# Patient Record
Sex: Male | Born: 1965 | Hispanic: Yes | Marital: Single | Smoking: Former smoker
Health system: Southern US, Community
[De-identification: ages and names within clinical notes are randomized; demographics above are authoritative.]

## PROBLEM LIST (undated history)

## (undated) DIAGNOSIS — I10 Essential (primary) hypertension: Secondary | ICD-10-CM

## (undated) DIAGNOSIS — E119 Type 2 diabetes mellitus without complications: Secondary | ICD-10-CM

## (undated) HISTORY — DX: Essential (primary) hypertension: I10

## (undated) HISTORY — DX: Type 2 diabetes mellitus without complications: E11.9

## (undated) HISTORY — PX: OTHER SURGICAL HISTORY: SHX169

---

## 2006-07-30 ENCOUNTER — Emergency Department (HOSPITAL_COMMUNITY): Admission: EM | Admit: 2006-07-30 | Discharge: 2006-07-30 | Payer: Self-pay | Admitting: Emergency Medicine

## 2016-01-20 ENCOUNTER — Emergency Department (HOSPITAL_COMMUNITY)
Admission: EM | Admit: 2016-01-20 | Discharge: 2016-01-20 | Disposition: A | Discharge status: Home / Self Care | Payer: Self-pay | Attending: Emergency Medicine | Admitting: Emergency Medicine

## 2016-01-20 ENCOUNTER — Encounter (HOSPITAL_COMMUNITY): Payer: Self-pay | Admitting: Emergency Medicine

## 2016-01-20 DIAGNOSIS — M549 Dorsalgia, unspecified: Secondary | ICD-10-CM

## 2016-01-20 DIAGNOSIS — M545 Low back pain: Secondary | ICD-10-CM | POA: Insufficient documentation

## 2016-01-20 MED ORDER — NAPROXEN 500 MG PO TABS: 500.0000 mg | ORAL_TABLET | Freq: Two times a day (BID) | ORAL | Status: DC

## 2016-01-20 MED ORDER — METHOCARBAMOL 500 MG PO TABS: 500.0000 mg | ORAL_TABLET | Freq: Two times a day (BID) | ORAL | Status: DC

## 2016-01-20 NOTE — ED Provider Notes (Signed)
CSN: [id number]     Arrival date & time 01/20/16  [id number] History  By signing my name below, I, Emmanuella Pham, attest that this documentation has been prepared under the direction and in the presence of Ian SitesLisa Betsie Peckman, PA-C. Electronically Signed: Angelene GiovanniEmmanuella Pham, ED Scribe. 01/20/2016. 10:29 AM.    Chief Complaint  Patient presents with  . Back Pain   The history is provided by the patient. No language interpreter was used.   HPI Comments: Ian Pham is a 50 y.o. male who presents to the Emergency Department complaining of gradually worsening moderate tight lower back pain onset one week ago after heavy lifting.  He adds that he constantly lifts heavy materials at work. No alleviating factors noted. Pt has tried OTC medication and icy hot with no relief. He reports NKDA. No fever, numbness/weakness, bowel/bladder incontinence, rash, or any open wounds.    History reviewed. No pertinent past medical history. History reviewed. No pertinent past surgical history. No family history on file. Social History  Substance Use Topics  . Smoking status: Never Smoker   . Smokeless tobacco: None  . Alcohol Use: None    Review of Systems  Constitutional: Negative for fever.  Musculoskeletal: Positive for back pain.  Skin: Negative for rash.  All other systems reviewed and are negative.     Allergies  Review of patient's allergies indicates not on file.  Home Medications   Prior to Admission medications   Not on File   BP 123/69 mmHg  Pulse 77  Temp(Src) 98.3 F (36.8 C) (Oral)  Resp 20  SpO2 96%   Physical Exam  Constitutional: He is oriented to person, place, and time. He appears well-developed and well-nourished.  HENT:  Head: Normocephalic and atraumatic.  Mouth/Throat: Oropharynx is clear and moist.  Eyes: Conjunctivae and EOM are normal. Pupils are equal, round, and reactive to light.  Neck: Normal range of motion.  Cardiovascular: Normal rate, regular rhythm and  normal heart sounds.   Pulmonary/Chest: Effort normal and breath sounds normal. No respiratory distress. He has no wheezes.  Abdominal: Soft. Bowel sounds are normal.  Musculoskeletal: Normal range of motion.  LS without midline deformities or step-off; muscular tenderness of lumbar paraspinal muscles bilaterally with spasm noted, full range of motion maintained, normal strength interpretation of bilateral lower extremities, normal gait  Neurological: He is alert and oriented to person, place, and time.  Skin: Skin is warm and dry.  Psychiatric: He has a normal mood and affect.  Nursing note and vitals reviewed.   ED Course  Procedures (including critical care time) DIAGNOSTIC STUDIES: Oxygen Saturation is 96% on RA, normal by my interpretation.    COORDINATION OF CARE: 10:21 AM- Pt advised of plan for treatment and pt agrees. Pt will receive Naproxen and Robaxin. [facility].   MDM   Final diagnoses:  Back pain, unspecified location   50 year old male here with low back pain after heavy lifting at work. No injury, trauma, or falls. Patient is afebrile, nontoxic.   No midline tenderness or deformities about the lumbar spine on exam. He does have some muscular tenderness bilateraly.  Neurologically intact without deficits to suggest cauda equina. No red flag symptoms. I suspect lumbar strain. Will start on anti-inflammatories and muscle relaxer. Recommended to follow-up with the [place] and wellness clinic.  Discussed plan with patient, he/she acknowledged understanding and agreed with plan of care.  Return precautions given for new or worsening symptoms.  I personally performed the services described in this documentation, which was scribed in my presence. The recorded information has been reviewed and is accurate.  Ian HatchetLisa M Orton Capell, PA-C 01/20/16 1036  Ian OharaJoshua Zavitz, MD 01/20/16 [phone]

## 2016-01-20 NOTE — Discharge Instructions (Signed)
Take the prescribed medication as directed.  May wish to use heating pad, warm shower, etc to help soothe muscles as well. Follow-up with [place] and wellness clinic. Return to the ED for new or worsening symptoms.

## 2016-01-20 NOTE — ED Notes (Signed)
Sharp pain in back while at work x 1 week ago has not gotten better

## 2016-01-20 NOTE — ED Notes (Signed)
Declined W/C at D/C and was escorted to lobby by RN. 

## 2017-07-07 ENCOUNTER — Other Ambulatory Visit: Payer: Self-pay

## 2017-07-07 ENCOUNTER — Emergency Department (HOSPITAL_COMMUNITY)
Admission: EM | Admit: 2017-07-07 | Discharge: 2017-07-07 | Disposition: A | Discharge status: Home / Self Care | Payer: Self-pay | Attending: Emergency Medicine | Admitting: Emergency Medicine

## 2017-07-07 DIAGNOSIS — R52 Pain, unspecified: Secondary | ICD-10-CM | POA: Insufficient documentation

## 2017-07-07 DIAGNOSIS — M791 Myalgia, unspecified site: Secondary | ICD-10-CM | POA: Insufficient documentation

## 2017-07-07 LAB — BASIC METABOLIC PANEL
Anion gap: 6 (ref 5–15)
BUN: 8 mg/dL (ref 6–20)
CO2: 23 mmol/L (ref 22–32)
Calcium: 9.4 mg/dL (ref 8.9–10.3)
Chloride: 104 mmol/L (ref 101–111)
Creatinine, Ser: 0.67 mg/dL (ref 0.61–1.24)
GFR calc Af Amer: >60 mL/min (ref >60)
GFR calc non Af Amer: >60 mL/min (ref >60)
Glucose, Bld: 142 mg/dL — ABNORMAL HIGH (ref 65–99)
Potassium: 3.8 mmol/L (ref 3.5–5.1)
Sodium: 133 mmol/L — ABNORMAL LOW (ref 135–145)

## 2017-07-07 LAB — CBC WITH DIFFERENTIAL/PLATELET
Basophils Absolute: 0 10*3/uL (ref 0.0–0.1)
Basophils Relative: 1 %
Eosinophils Absolute: 0.3 10*3/uL (ref 0.0–0.7)
Eosinophils Relative: 4 %
HCT: 44.6 % (ref 39.0–52.0)
Hemoglobin: 15.6 g/dL (ref 13.0–17.0)
Lymphocytes Relative: 37 %
Lymphs Abs: 3 10*3/uL (ref 0.7–4.0)
MCH: 32.2 pg (ref 26.0–34.0)
MCHC: 35 g/dL (ref 30.0–36.0)
MCV: 92.1 fL (ref 78.0–100.0)
Monocytes Absolute: 0.8 10*3/uL (ref 0.1–1.0)
Monocytes Relative: 10 %
Neutro Abs: 3.8 10*3/uL (ref 1.7–7.7)
Neutrophils Relative %: 48 %
Platelets: 211 10*3/uL (ref 150–400)
RBC: 4.84 MIL/uL (ref 4.22–5.81)
RDW: 12.8 % (ref 11.5–15.5)
WBC: 8 10*3/uL (ref 4.0–10.5)

## 2017-07-07 NOTE — Discharge Instructions (Signed)
Please read attached information. If you experience any new or worsening signs or symptoms please return to the emergency room for evaluation. Please follow-up with your primary care provider or specialist as discussed.  °

## 2017-07-07 NOTE — ED Triage Notes (Signed)
Pt c/o generalized body aches for two weeks, denies all other symptoms, pt denies SOB, n/v/d, A&o x4

## 2017-07-07 NOTE — ED Provider Notes (Signed)
[facility] EMERGENCY DEPARTMENT Provider Note   CSN: [id number] Arrival date & time: 07/07/17  1032     History   Chief Complaint Chief Complaint  Patient presents with  . Generalized Body Aches    HPI Ian Pham is a 51 y.o. male.  HPI translator used  51 year old male presents today with generalized body aches.  Patient notes 2-3 week history of pain in his joints.  He also notes pain in the muscles as well.  Patient reports symptoms are worse in the afternoon and evening.  He reports no focal weakness or trauma, normal urine output, denies fevers or recent infection.  Patient reports movement makes symptoms somewhat better.  He notes an episode several years ago of the same that resolved.  Patient reports he works in [employer].  Patient reports using Advil without some dramatic improvement.      No past medical history on file.  There are no active problems to display for this patient.   No past surgical history on file.     Home Medications    Prior to Admission medications   Medication Sig Start Date End Date Taking? Authorizing Provider  ibuprofen (ADVIL,MOTRIN) 200 MG tablet Take 200 mg by mouth every 6 (six) hours as needed for moderate pain.   Yes [provider]  methocarbamol (ROBAXIN) 500 MG tablet Take 1 tablet (500 mg total) by mouth 2 (two) times daily. Patient not taking: Reported on 07/07/2017 01/20/16   Garlon HatchetSanders, Lisa M, PA-C  naproxen (NAPROSYN) 500 MG tablet Take 1 tablet (500 mg total) by mouth 2 (two) times daily with a meal. Patient not taking: Reported on 07/07/2017 01/20/16   Garlon HatchetSanders, Lisa M, PA-C    Family History No family history on file.  Social History Social History   Tobacco Use  . Smoking status: Never Smoker  Substance Use Topics  . Alcohol use: Not on file  . Drug use: Not on file     Allergies   Patient has no allergy information on record.   Review of Systems Review of Systems    All other systems reviewed and are negative.    Physical Exam Updated Vital Signs BP 136/78 (BP Location: Right Arm)   Pulse 72   Temp 97.6 F (36.4 C) (Oral)   Resp 16   SpO2 98%   Physical Exam  Constitutional: He is oriented to person, place, and time. He appears well-developed and well-nourished.  HENT:  Head: Normocephalic and atraumatic.  Eyes: Conjunctivae are normal. Pupils are equal, round, and reactive to light. Right eye exhibits no discharge. Left eye exhibits no discharge. No scleral icterus.  Neck: Normal range of motion. No JVD present. No tracheal deviation present.  Pulmonary/Chest: Effort normal. No stridor.  Musculoskeletal:  TTP of the back diffusely - non focal  Joints supple full active range of motion-no swelling or edema, joint compartments soft  Neurological: He is alert and oriented to person, place, and time. Coordination normal.  Psychiatric: He has a normal mood and affect. His behavior is normal. Judgment and thought content normal.  Nursing note and vitals reviewed.    ED Treatments / Results  Labs (all labs ordered are listed, but only abnormal results are displayed) Labs Reviewed  BASIC METABOLIC PANEL - Abnormal; Notable for the following components:      Result Value   Sodium 133 (*)    Glucose, Bld 142 (*)    All other components within normal limits  CBC WITH  DIFFERENTIAL/PLATELET    EKG  EKG Interpretation None       Radiology No results found.  Procedures Procedures (including critical care time)  Medications Ordered in ED Medications - No data to display   Initial Impression / Assessment and Plan / ED Course  I have reviewed the triage vital signs and the nursing notes.  Pertinent labs & imaging results that were available during my care of the patient were reviewed by me and considered in my medical decision making (see chart for details).      Final Clinical Impressions(s) / ED Diagnoses   Final  diagnoses:  Body aches    Labs: BC, BMP  Imaging:  Consults:  Therapeutics:  Discharge Meds:   Assessment/Plan: 51 year old male presents today with vague complaints.  Signs of infectious etiology.  I question arthritis in this patient.  Patient instructed to rest, ibuprofen or Tylenol, return immediately with any new or worsening signs or symptoms.  Patient verbalized understanding and agreement to today's plan had no further questions or concerns.      ED Discharge Orders    None       Rosalio LoudHedges, Garmon Dehn, PA-C 07/07/17 1620    Bethann BerkshireZammit, Joseph, MD 07/08/17 [phone]

## 2017-08-26 ENCOUNTER — Other Ambulatory Visit: Payer: Self-pay

## 2017-08-26 ENCOUNTER — Encounter (HOSPITAL_COMMUNITY): Payer: Self-pay | Admitting: Neurology

## 2017-08-26 ENCOUNTER — Emergency Department (HOSPITAL_COMMUNITY)
Admission: EM | Admit: 2017-08-26 | Discharge: 2017-08-26 | Disposition: A | Discharge status: Home / Self Care | Payer: Self-pay | Attending: Emergency Medicine | Admitting: Emergency Medicine

## 2017-08-26 DIAGNOSIS — N342 Other urethritis: Secondary | ICD-10-CM | POA: Insufficient documentation

## 2017-08-26 LAB — URINALYSIS, ROUTINE W REFLEX MICROSCOPIC
Bilirubin Urine: NEGATIVE
GLUCOSE, UA: NEGATIVE mg/dL
Hgb urine dipstick: NEGATIVE
Ketones, ur: NEGATIVE mg/dL
Leukocytes, UA: NEGATIVE
Nitrite: NEGATIVE
PH: 8 (ref 5.0–8.0)
Protein, ur: NEGATIVE mg/dL
Specific Gravity, Urine: 1.016 (ref 1.005–1.030)

## 2017-08-26 MED ORDER — CEFTRIAXONE SODIUM 250 MG IJ SOLR
250.0000 mg | Freq: Once | INTRAMUSCULAR | Status: AC
  Administered 2017-08-26: 250 mg via INTRAMUSCULAR
  Filled 2017-08-26: qty 250

## 2017-08-26 MED ORDER — LIDOCAINE HCL (PF) 1 % IJ SOLN
INTRAMUSCULAR | Status: AC
  Filled 2017-08-26: qty 5

## 2017-08-26 MED ORDER — AZITHROMYCIN 250 MG PO TABS
1000.0000 mg | ORAL_TABLET | Freq: Once | ORAL | Status: AC
  Administered 2017-08-26: 1000 mg via ORAL
  Filled 2017-08-26: qty 4

## 2017-08-26 MED ORDER — LIDOCAINE HCL (PF) 1 % IJ SOLN
2.0000 mL | Freq: Once | INTRAMUSCULAR | Status: AC
  Administered 2017-08-26: 2 mL

## 2017-08-26 NOTE — ED Notes (Signed)
Translator used to go over discharge papers Onalee Hua- David [phone]

## 2017-08-26 NOTE — ED Triage Notes (Signed)
Is spanish speaking-using translator- a few days ago he had intercourse, has been having pelvic pain, with burning to his penis, urinating more frequently. Is concerned he may have contracted an STD. Denies penile discharge or hematuria. Main complaint is pain in his penis to his belly button.

## 2017-08-26 NOTE — ED Provider Notes (Signed)
[facility] Provider Note   CSN: [id number] Arrival date & time: 08/26/17  1301     History   Chief Complaint Chief Complaint  Patient presents with  . Urinary Frequency    HPI Ian Pham is a 52 y.o. male.  HPI translator was used for additional help  52 year old male presents today with dysuria.  Patient notes 3-4-week history of dysuria and pain up into his abdomen.  He notes the pain comes after urinating rating up to the umbilicus.  He denies any penile discharge, denies any rash.  He reports that he is sexually active and had sex with a new partner around the time symptoms started, he did not wear a condom.  Patient denies any fever, nausea vomiting, denies any perineal pain, reports he is able to fully empty his bladder.  Patient notes he has had STDs in the past and reports this feels similar.   History reviewed. No pertinent past medical history.  There are no active problems to display for this patient.   History reviewed. No pertinent surgical history.     Home Medications    Prior to Admission medications   Medication Sig Start Date End Date Taking? Authorizing Provider  ibuprofen (ADVIL,MOTRIN) 200 MG tablet Take 200 mg by mouth every 6 (six) hours as needed for moderate pain.    [provider]  methocarbamol (ROBAXIN) 500 MG tablet Take 1 tablet (500 mg total) by mouth 2 (two) times daily. Patient not taking: Reported on 07/07/2017 01/20/16   Garlon HatchetSanders, Lisa M, PA-C  naproxen (NAPROSYN) 500 MG tablet Take 1 tablet (500 mg total) by mouth 2 (two) times daily with a meal. Patient not taking: Reported on 07/07/2017 01/20/16   Garlon HatchetSanders, Lisa M, PA-C    Family History No family history on file.  Social History Social History   Tobacco Use  . Smoking status: Never Smoker  Substance Use Topics  . Alcohol use: Yes  . Drug use: Not on file     Allergies   Patient has no known allergies.   Review of  Systems Review of Systems  All other systems reviewed and are negative.    Physical Exam Updated Vital Signs BP 139/82 (BP Location: Right Arm)   Pulse 90   Temp 98 F (36.7 C) (Oral)   Resp 16   SpO2 96%   Physical Exam  Constitutional: He is oriented to person, place, and time. He appears well-developed and well-nourished.  HENT:  Head: Normocephalic and atraumatic.  Eyes: Conjunctivae are normal. Pupils are equal, round, and reactive to light. Right eye exhibits no discharge. Left eye exhibits no discharge. No scleral icterus.  Neck: Normal range of motion. No JVD present. No tracheal deviation present.  Pulmonary/Chest: Effort normal. No stridor.  Neurological: He is alert and oriented to person, place, and time. Coordination normal.  Psychiatric: He has a normal mood and affect. His behavior is normal. Judgment and thought content normal.  Nursing note and vitals reviewed.    ED Treatments / Results  Labs (all labs ordered are listed, but only abnormal results are displayed) Labs Reviewed  URINALYSIS, ROUTINE W REFLEX MICROSCOPIC  GC/CHLAMYDIA PROBE AMP ([place]) NOT AT [facility]    EKG  EKG Interpretation None       Radiology No results found.  Procedures Procedures (including critical care time)  Medications Ordered in ED Medications  lidocaine (PF) (XYLOCAINE) 1 % injection (not administered)  cefTRIAXone (ROCEPHIN) injection 250 mg (250  mg Intramuscular Given 08/26/17 1528)  azithromycin (ZITHROMAX) tablet 1,000 mg (1,000 mg Oral Given 08/26/17 1529)  lidocaine (PF) (XYLOCAINE) 1 % injection 2 mL (2 mLs Other Given 08/26/17 1529)     Initial Impression / Assessment and Plan / ED Course  I have reviewed the triage vital signs and the nursing notes.  Pertinent labs & imaging results that were available during my care of the patient were reviewed by me and considered in my medical decision making (see chart for details).      Final Clinical  Impressions(s) / ED Diagnoses   Final diagnoses:  Urethritis    Labs: Urinalysis, GC  Imaging:  Consults:  Therapeutics:  Discharge Meds:   Assessment/Plan: 52 year old male presents today with suspected STD.  Patient's urinalysis completely normal, he reports this feels similar to previous STD and would like testing and treatment.  Patient is not having any perineal pain he is afebrile otherwise well-appearing he is a soft benign nontender abdomen.  I have low suspicion for intra-abdominal pathology, prostatitis or any other significant infection.  Patient is encouraged to follow-up as an outpatient, return immediately with any new or worsening signs or symptoms.  He verbalized understanding and agreement to today's plan had no further questions or concerns at the time discharge.      ED Discharge Orders    None       Rosalio Loud 08/26/17 1551    Nira Conn, MD 08/26/17 2043

## 2017-08-26 NOTE — Discharge Instructions (Signed)
Please read attached information. If you experience any new or worsening signs or symptoms please return to the emergency room for evaluation. Please follow-up with your primary care provider or specialist as discussed. Please use medication prescribed only as directed and discontinue taking if you have any concerning signs or symptoms.   °

## 2017-08-27 LAB — GC/CHLAMYDIA PROBE AMP (~~LOC~~) NOT AT ARMC
Chlamydia: NEGATIVE
Neisseria Gonorrhea: NEGATIVE

## 2017-09-24 ENCOUNTER — Encounter (HOSPITAL_COMMUNITY): Payer: Self-pay

## 2017-09-24 ENCOUNTER — Emergency Department (HOSPITAL_COMMUNITY)
Admission: EM | Admit: 2017-09-24 | Discharge: 2017-09-24 | Disposition: A | Discharge status: Home / Self Care | Payer: Self-pay | Attending: Emergency Medicine | Admitting: Emergency Medicine

## 2017-09-24 ENCOUNTER — Emergency Department (HOSPITAL_COMMUNITY): Payer: Self-pay

## 2017-09-24 ENCOUNTER — Other Ambulatory Visit: Payer: Self-pay

## 2017-09-24 DIAGNOSIS — R197 Diarrhea, unspecified: Secondary | ICD-10-CM | POA: Insufficient documentation

## 2017-09-24 DIAGNOSIS — R112 Nausea with vomiting, unspecified: Secondary | ICD-10-CM | POA: Insufficient documentation

## 2017-09-24 DIAGNOSIS — K5732 Diverticulitis of large intestine without perforation or abscess without bleeding: Secondary | ICD-10-CM | POA: Insufficient documentation

## 2017-09-24 LAB — COMPREHENSIVE METABOLIC PANEL
ALT: 79 U/L — AB (ref 17–63)
AST: 81 U/L — ABNORMAL HIGH (ref 15–41)
Albumin: 3.4 g/dL — ABNORMAL LOW (ref 3.5–5.0)
Alkaline Phosphatase: 121 U/L (ref 38–126)
Anion gap: 10 (ref 5–15)
BUN: 9 mg/dL (ref 6–20)
CHLORIDE: 105 mmol/L (ref 101–111)
CO2: 23 mmol/L (ref 22–32)
CREATININE: 0.62 mg/dL (ref 0.61–1.24)
Calcium: 8.8 mg/dL — ABNORMAL LOW (ref 8.9–10.3)
GFR calc Af Amer: >60 mL/min (ref >60)
GFR calc non Af Amer: >60 mL/min (ref >60)
Glucose, Bld: 102 mg/dL — ABNORMAL HIGH (ref 65–99)
Potassium: 3.5 mmol/L (ref 3.5–5.1)
Sodium: 138 mmol/L (ref 135–145)
Total Bilirubin: 0.7 mg/dL (ref 0.3–1.2)
Total Protein: 7.5 g/dL (ref 6.5–8.1)

## 2017-09-24 LAB — CBC
HCT: 40.4 % (ref 39.0–52.0)
Hemoglobin: 14.3 g/dL (ref 13.0–17.0)
MCH: 32.4 pg (ref 26.0–34.0)
MCHC: 35.4 g/dL (ref 30.0–36.0)
MCV: 91.6 fL (ref 78.0–100.0)
PLATELETS: 200 10*3/uL (ref 150–400)
RBC: 4.41 MIL/uL (ref 4.22–5.81)
RDW: 13.1 % (ref 11.5–15.5)
WBC: 7.7 10*3/uL (ref 4.0–10.5)

## 2017-09-24 LAB — LIPASE, BLOOD: LIPASE: 25 U/L (ref 11–51)

## 2017-09-24 LAB — URINALYSIS, ROUTINE W REFLEX MICROSCOPIC
Bilirubin Urine: NEGATIVE
GLUCOSE, UA: NEGATIVE mg/dL
Hgb urine dipstick: NEGATIVE
Ketones, ur: NEGATIVE mg/dL
Leukocytes, UA: NEGATIVE
Nitrite: NEGATIVE
PROTEIN: NEGATIVE mg/dL
Specific Gravity, Urine: >1.046 — ABNORMAL HIGH (ref 1.005–1.030)
pH: 9 — ABNORMAL HIGH (ref 5.0–8.0)

## 2017-09-24 MED ORDER — CIPROFLOXACIN IN D5W 400 MG/200ML IV SOLN
400.0000 mg | Freq: Once | INTRAVENOUS | Status: AC
  Administered 2017-09-24: 400 mg via INTRAVENOUS
  Filled 2017-09-24: qty 200

## 2017-09-24 MED ORDER — METRONIDAZOLE 500 MG PO TABS: 500.0000 mg | ORAL_TABLET | Freq: Two times a day (BID) | ORAL | 0 refills | Status: DC

## 2017-09-24 MED ORDER — PROMETHAZINE HCL 25 MG PO TABS: 25.0000 mg | ORAL_TABLET | Freq: Four times a day (QID) | ORAL | 0 refills | Status: DC | PRN

## 2017-09-24 MED ORDER — METRONIDAZOLE IN NACL 5-0.79 MG/ML-% IV SOLN
500.0000 mg | Freq: Once | INTRAVENOUS | Status: AC
  Administered 2017-09-24: 500 mg via INTRAVENOUS
  Filled 2017-09-24: qty 100

## 2017-09-24 MED ORDER — SODIUM CHLORIDE 0.9 % IV BOLUS (SEPSIS)
1000.0000 mL | Freq: Once | INTRAVENOUS | Status: AC
  Administered 2017-09-24: 1000 mL via INTRAVENOUS

## 2017-09-24 MED ORDER — HYDROCODONE-ACETAMINOPHEN 5-325 MG PO TABS: 1.0000 | ORAL_TABLET | Freq: Four times a day (QID) | ORAL | 0 refills | Status: DC | PRN

## 2017-09-24 MED ORDER — CIPROFLOXACIN HCL 500 MG PO TABS: 500.0000 mg | ORAL_TABLET | Freq: Two times a day (BID) | ORAL | 0 refills | Status: DC

## 2017-09-24 MED ORDER — IOPAMIDOL (ISOVUE-300) INJECTION 61%
INTRAVENOUS | Status: AC
  Administered 2017-09-24: 100 mL
  Filled 2017-09-24: qty 100

## 2017-09-24 NOTE — ED Notes (Signed)
Dietary services delivered food tray to pt room despite no food tray being ordered. Pt ate all the food on the tray. EDP aware.

## 2017-09-24 NOTE — ED Notes (Addendum)
Pt reports abd pain x 4 days with N/V/D worsening upon eating. Denies CP or SOB. Denies fever, chills, or cough

## 2017-09-24 NOTE — ED Provider Notes (Signed)
[facility] EMERGENCY DEPARTMENT Provider Note   CSN: [id number] Arrival date & time: 09/24/17  1213     History   Chief Complaint Chief Complaint  Patient presents with  . Abdominal Pain    HPI Ian Pham is a 52 y.o. male.  HPI Patient presents to the emergency department with abdominal discomfort that started 3 days ago.  The patient states he has had diarrhea with some nausea and vomiting.  The patient states that he has not had any fever.  The patient states he did not take any medications prior to arrival for symptoms.  The patient denies chest pain, shortness of breath, headache,blurred vision, neck pain, fever, cough, weakness, numbness, dizziness, anorexia, edema, rash, back pain, dysuria, hematemesis, bloody stool, near syncope, or syncope.  Patient states that palpation makes the pain worse. History reviewed. No pertinent past medical history.  There are no active problems to display for this patient.   History reviewed. No pertinent surgical history.     Home Medications    Prior to Admission medications   Medication Sig Start Date End Date Taking? Authorizing Provider  ibuprofen (ADVIL,MOTRIN) 200 MG tablet Take 200 mg by mouth every 6 (six) hours as needed for moderate pain.    [provider]  methocarbamol (ROBAXIN) 500 MG tablet Take 1 tablet (500 mg total) by mouth 2 (two) times daily. Patient not taking: Reported on 07/07/2017 01/20/16   Garlon Hatchet, PA-C  naproxen (NAPROSYN) 500 MG tablet Take 1 tablet (500 mg total) by mouth 2 (two) times daily with a meal. Patient not taking: Reported on 07/07/2017 01/20/16   Garlon Hatchet, PA-C    Family History No family history on file.  Social History Social History   Tobacco Use  . Smoking status: Never Smoker  Substance Use Topics  . Alcohol use: Yes  . Drug use: Not on file     Allergies   Patient has no known allergies.   Review of Systems Review of  Systems All other systems negative except as documented in the HPI. All pertinent positives and negatives as reviewed in the HPI.  Physical Exam Updated Vital Signs BP (!) 126/91   Pulse 63   Temp 98.8 F (37.1 C) (Oral)   Resp 19   Wt 77.1 kg (170 lb)   SpO2 96%   Physical Exam  Constitutional: He is oriented to person, place, and time. He appears well-developed and well-nourished. No distress.  HENT:  Head: Normocephalic and atraumatic.  Mouth/Throat: Oropharynx is clear and moist.  Eyes: Pupils are equal, round, and reactive to light.  Neck: Normal range of motion. Neck supple.  Cardiovascular: Normal rate, regular rhythm and normal heart sounds. Exam reveals no gallop and no friction rub.  No murmur heard. Pulmonary/Chest: Effort normal and breath sounds normal. No respiratory distress. He has no wheezes.  Abdominal: Soft. Bowel sounds are normal. He exhibits no distension. There is tenderness in the left lower quadrant. There is no rigidity, no rebound and no guarding.  Neurological: He is alert and oriented to person, place, and time. He exhibits normal muscle tone. Coordination normal.  Skin: Skin is warm and dry. Capillary refill takes less than 2 seconds. No rash noted. No erythema.  Psychiatric: He has a normal mood and affect. His behavior is normal.  Nursing note and vitals reviewed.    ED Treatments / Results  Labs (all labs ordered are listed, but only abnormal results are displayed) Labs Reviewed  COMPREHENSIVE METABOLIC PANEL - Abnormal; Notable for the following components:      Result Value   Glucose, Bld 102 (*)    Calcium 8.8 (*)    Albumin 3.4 (*)    AST 81 (*)    ALT 79 (*)    All other components within normal limits  URINALYSIS, ROUTINE W REFLEX MICROSCOPIC - Abnormal; Notable for the following components:   Specific Gravity, Urine >1.046 (*)    pH 9.0 (*)    All other components within normal limits  LIPASE, BLOOD  CBC    EKG  EKG  Interpretation None       Radiology Ct Abdomen Pelvis W Contrast  Result Date: 09/24/2017 CLINICAL DATA:  52 year old male with abdomen and back pain for 4 days with diarrhea. EXAM: CT ABDOMEN AND PELVIS WITH CONTRAST TECHNIQUE: Multidetector CT imaging of the abdomen and pelvis was performed using the standard protocol following bolus administration of intravenous contrast. CONTRAST:  100mL ISOVUE-300 IOPAMIDOL (ISOVUE-300) INJECTION 61% COMPARISON:  None. FINDINGS: Lower chest: Cardiomegaly. No pericardial effusion. Respiratory motion at the lung bases. Dependent pulmonary atelectasis, but questionable also diffuse mild pulmonary ground-glass opacity. However, no pleural fluid is present at the lung bases. Hepatobiliary: Borderline to mild hepatic steatosis. Otherwise negative liver and gallbladder. Pancreas: Negative. Spleen: Negative. Adrenals/Urinary Tract: Normal adrenal glands. Bilateral renal enhancement and contrast excretion is symmetric and normal. Normal course of both ureters. Unremarkable urinary bladder. Stomach/Bowel: Negative rectum. Moderate to severe diverticulosis throughout the sigmoid colon and distal descending colon. In the distal descending segment there is a 4-5 centimeter area of mesenteric stranding surrounding posteriorly situated diverticula (series 3, image 68 and sagittal image 95. Mild colonic wall thickening in the region. No extraluminal gas. No fluid or fluid collection. Negative splenic flexure. Negative transverse colon and right colon aside from retained stool. Negative appendix. Negative terminal ileum. No dilated small bowel. Negative stomach and duodenum. No free air or free fluid in the abdomen. Vascular/Lymphatic: Major arterial structures are patent with minimal atherosclerosis. The portal venous system is patent. No lymphadenopathy. Reproductive: Negative. Other: No pelvic free fluid. Musculoskeletal: L5-S1 advanced disc degeneration with vacuum disc. No acute  osseous abnormality identified. IMPRESSION: 1. Mild inflammation along a 5 cm segment of the distal descending colon compatible with acute diverticulitis. No complicating features. Underlying diverticulosis in the descending and sigmoid colon. 2. Mild cardiomegaly. Electronically Signed   By: Odessa FlemingH  Hall M.D.   On: 09/24/2017 18:48    Procedures Procedures (including critical care time)  Medications Ordered in ED Medications  iopamidol (ISOVUE-300) 61 % injection (100 mLs  Contrast Given 09/24/17 1827)  sodium chloride 0.9 % bolus 1,000 mL (0 mLs Intravenous Stopped 09/24/17 2100)  ciprofloxacin (CIPRO) IVPB 400 mg (0 mg Intravenous Stopped 09/24/17 2206)    And  metroNIDAZOLE (FLAGYL) IVPB 500 mg (0 mg Intravenous Stopped 09/24/17 2209)     Initial Impression / Assessment and Plan / ED Course  I have reviewed the triage vital signs and the nursing notes.  Pertinent labs & imaging results that were available during my care of the patient were reviewed by me and considered in my medical decision making (see chart for details).     Patient has what appears to be uncomplicated diverticulitis.  I was given IV antibiotics along with IV fluids.  I have advised him to follow-up with general surgery.  Also advised him to increase his fluid intake.  He was given p.o. antibiotics for home he is advised to  return for any worsening in his condition patient agrees to the plan and all questions were answered.  Patient has what appears to be stable vital signs.  Final Clinical Impressions(s) / ED Diagnoses   Final diagnoses:  None    ED Discharge Orders    None       Charlestine Night, PA-C 09/25/17 1610    Gwyneth Sprout, MD 09/28/17 [phone]

## 2017-09-24 NOTE — ED Notes (Signed)
ED Provider at bedside. 

## 2017-09-24 NOTE — ED Triage Notes (Signed)
Pt presents to the ed with complaints of abdominal and back pain x 4 days. Pt endorses diarrhea also.

## 2017-09-24 NOTE — ED Notes (Signed)
Patient transported to CT 

## 2017-09-24 NOTE — Discharge Instructions (Signed)
Regrese aqu cuando sea necesario. Seguimiento con el mdico proporcionado. Usted tiene una infeccin de su colon llamada divcerticulitis.

## 2017-09-24 NOTE — ED Notes (Signed)
Patient Alert and oriented to baseline. Stable and ambulatory to baseline. Patient verbalized understanding of the discharge instructions.  Patient belongings were taken by the patient.   

## 2018-01-09 ENCOUNTER — Other Ambulatory Visit: Payer: Self-pay

## 2018-01-09 ENCOUNTER — Emergency Department (HOSPITAL_COMMUNITY)
Admission: EM | Admit: 2018-01-09 | Discharge: 2018-01-09 | Disposition: A | Discharge status: Home / Self Care | Payer: Self-pay | Attending: Emergency Medicine | Admitting: Emergency Medicine

## 2018-01-09 ENCOUNTER — Encounter (HOSPITAL_COMMUNITY): Payer: Self-pay | Admitting: Emergency Medicine

## 2018-01-09 DIAGNOSIS — L509 Urticaria, unspecified: Secondary | ICD-10-CM | POA: Insufficient documentation

## 2018-01-09 MED ORDER — PREDNISONE 10 MG PO TABS: 40.0000 mg | ORAL_TABLET | Freq: Every day | ORAL | 0 refills | Status: AC

## 2018-01-09 NOTE — Discharge Instructions (Signed)
You were seen in the ER for rash.  This rash is likely from an allergic reaction to an environmental allergen or insect.   Take prednisone for the next 5 days. Take a daily allergy medication (loratadine or cetirizine).  For stronger itch control, you can take benadryl. Keep skin clean with clean water and soap.   Return to the ER if rash worsens despite medication, you develop fevers, chills, sore throat, cough, nausea, vomiting, abdominal pain, body or muscle aches, facial or tongue swelling, difficulty breathing.  Usted se present a la sala de emergencias para evaluacin de erupcin. Esta erupcin es mas probable debido a Runner, broadcasting/film/videouna reaccin alrgica a un alergeno o insecto ambiental.  Tome prednisona durante los prximos 5 [place]das. Tome un pastilla para la alergia (loratadina o cetirizina) una vez al da por los prximos 5 dias. Para mejor control de la picazn, puede tomar benadryl. Mantenga la piel limpia con agua limpia y [place].  Regrese a la sala de emergencias si el sarpullido empeora a pesar de las medicinas, o si desarrola fiebre, escalofros, dolor de garganta, tos, nuseas, vmitos, dolor abdominal, dolor de cuerpo o msculos, hinchazn facial o de la lengua, dificultad para respirar.

## 2018-01-09 NOTE — ED Triage Notes (Signed)
Pt reports diffuse, red, itchy rash x3 days. Denies any sick contacts.

## 2018-01-09 NOTE — ED Provider Notes (Signed)
[facility] EMERGENCY DEPARTMENT Provider Note   CSN: [id number] Arrival date & time: 01/09/18  0830     History   Chief Complaint Chief Complaint  Patient presents with  . Rash    HPI Ian Pham is a 52 y.o. male here for evaluation of rash x 3 days. Rash is red, itchy, warm, non tender. Rash located to upper and lower extremities only. He works outside in [employer], always wears long pants and a shirt. He thinks he remembers a bug bite during work but is not very sure. He denies seeing or being exposed to ticks, camping, woods. Pharmacist told him to use benadryl and a cream which he has used without relief.  He denies fevers, chills, neck pain, nausea, vomiting, abdominal pain myalgias. No recent medications. No recent travel. No sick contacts.   HPI  History reviewed. No pertinent past medical history.  There are no active problems to display for this patient.   History reviewed. No pertinent surgical history.      Home Medications    Prior to Admission medications   Medication Sig Start Date End Date Taking? Authorizing Provider  ciprofloxacin (CIPRO) 500 MG tablet Take 1 tablet (500 mg total) by mouth 2 (two) times daily. 09/24/17   Lawyer, Cristal Deer, PA-C  HYDROcodone-acetaminophen (NORCO/VICODIN) 5-325 MG tablet Take 1 tablet by mouth every 6 (six) hours as needed for moderate pain. 09/24/17   Lawyer, Cristal Deer, PA-C  ibuprofen (ADVIL,MOTRIN) 200 MG tablet Take 200 mg by mouth every 6 (six) hours as needed for moderate pain.    [provider]  methocarbamol (ROBAXIN) 500 MG tablet Take 1 tablet (500 mg total) by mouth 2 (two) times daily. Patient not taking: Reported on 07/07/2017 01/20/16   Garlon Hatchet, PA-C  metroNIDAZOLE (FLAGYL) 500 MG tablet Take 1 tablet (500 mg total) by mouth 2 (two) times daily. 09/24/17   Lawyer, Cristal Deer, PA-C  naproxen (NAPROSYN) 500 MG tablet Take 1 tablet (500 mg total) by mouth 2 (two) times daily  with a meal. Patient not taking: Reported on 07/07/2017 01/20/16   Garlon Hatchet, PA-C  predniSONE (DELTASONE) 10 MG tablet Take 4 tablets (40 mg total) by mouth daily for 5 days. 01/09/18 01/14/18  Liberty Handy, PA-C  promethazine (PHENERGAN) 25 MG tablet Take 1 tablet (25 mg total) by mouth every 6 (six) hours as needed for nausea or vomiting. 09/24/17   Lawyer, Cristal Deer, PA-C    Family History No family history on file.  Social History Social History   Tobacco Use  . Smoking status: Never Smoker  Substance Use Topics  . Alcohol use: Yes  . Drug use: Not on file     Allergies   Patient has no known allergies.   Review of Systems Review of Systems  Skin: Positive for rash.  All other systems reviewed and are negative.    Physical Exam Updated Vital Signs BP 131/82 (BP Location: Right Arm)   Pulse 82   Temp 98 F (36.7 C) (Oral)   Resp 20   SpO2 96%   Physical Exam  Constitutional: He is oriented to person, place, and time. He appears well-developed and well-nourished.  Non-toxic appearance.  HENT:  Head: Normocephalic.  Right Ear: External ear normal.  Left Ear: External ear normal.  Nose: Nose normal.  Eyes: Conjunctivae and EOM are normal.  Neck: Full passive range of motion without pain.  Cardiovascular: Normal rate.  Pulmonary/Chest: Effort normal. No tachypnea. No respiratory distress.  Musculoskeletal: Normal range of motion.  Neurological: He is alert and oriented to person, place, and time.  Skin: Skin is warm and dry. Capillary refill takes less than 2 seconds.  Raised, erythematous, diffuse rash to distal upper extremities bilaterally and proximal/distal lower extremities. No rash to thorax, back, abdomen, face, neck, palms or soles. No intraoral lesions. No lesion to genitalia.   Psychiatric: His behavior is normal. Thought content normal.     ED Treatments / Results  Labs (all labs ordered are listed, but only abnormal results are  displayed) Labs Reviewed - No data to display  EKG None  Radiology No results found.  Procedures Procedures (including critical care time)  Medications Ordered in ED Medications - No data to display   Initial Impression / Assessment and Plan / ED Course  I have reviewed the triage vital signs and the nursing notes.  Pertinent labs & imaging results that were available during my care of the patient were reviewed by me and considered in my medical decision making (see chart for details).     52 y.o. yo male presents with raised, erythematous, pruritic rash on exposed areas of skin x 3 days.  Possible exposure to outdoor allergen/insect. No facial or oral angioedema.  No respiratory compromise.  No new topical hygiene products used.  No new medications.  No evidence of animal bite wounds.  No fluctuance, induration that would suggest abscess or cellulitis. Very low suspicion for dermatological emergency including EM, SJS, TEN or meningococcemia.  Considered tick born illness rash however there is no classic target sign.  He has no constitutional symptoms. Suspect urticaria secondary to environmental allergen. Patient will be discharged with prednisone, antihistamines, ice and close f/u with PCP.  Strict ED return precautions given, pt is aware of red flag symptoms that would warrant return to ED for further evaluation and tx.     Final Clinical Impressions(s) / ED Diagnoses   Final diagnoses:  Urticaria    ED Discharge Orders        Ordered    predniSONE (DELTASONE) 10 MG tablet  Daily     01/09/18 0938       Liberty HandyGibbons, Tykwon Fera J, PA-C 01/09/18 [id number]    Gwyneth SproutPlunkett, Whitney, MD 01/10/18 2255

## 2018-01-09 NOTE — ED Notes (Signed)
PA at bedside.

## 2018-06-28 IMAGING — CT CT ABD-PELV W/ CM
2 of 5 series · 16 of 46 positions shown, 18 images · IV contrast (APPLIED)
Comparison: None.

CLINICAL DATA: 51-year-old male with abdomen and back pain for 4
days with diarrhea.

EXAM:
CT ABDOMEN AND PELVIS WITH CONTRAST
TECHNIQUE: Multidetector CT imaging of the abdomen and pelvis was performed
using the standard protocol following bolus administration of
intravenous contrast.
CONTRAST:  100mL [id number] IOPAMIDOL ([id number]) INJECTION 61%

[Series 3: abdomen 5.0 · axial · 0.91mm/px · z∈[+808,+1298]mm · 13 of 112 slices shown, 15 images]
[im 7/112  soft-tissue]
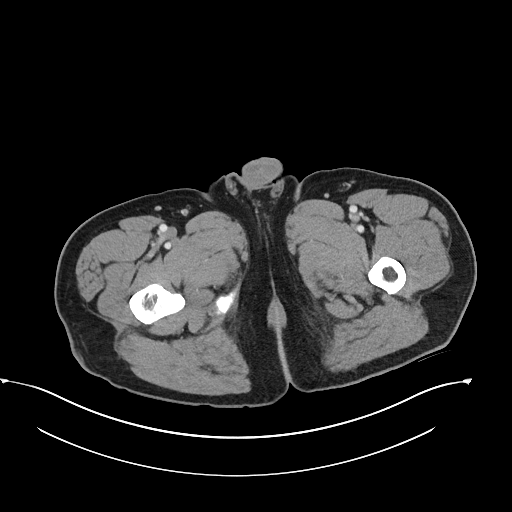
[im 7/112  bone]
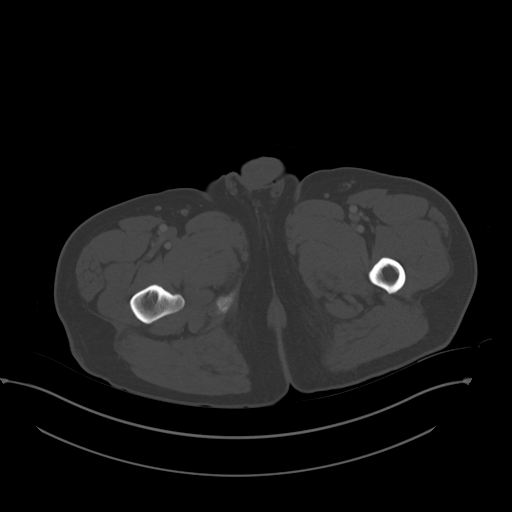
[im 14/112  soft-tissue]
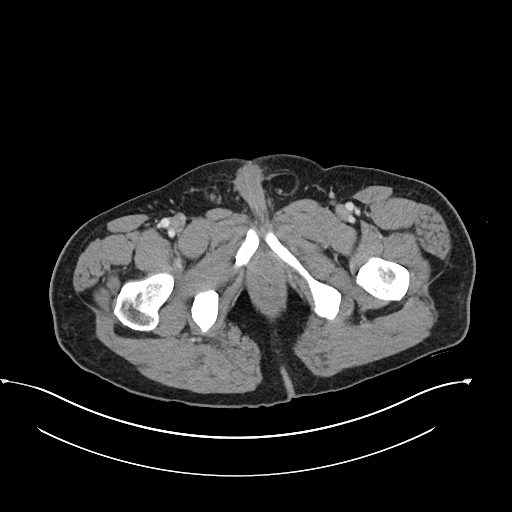
[im 21/112  soft-tissue]
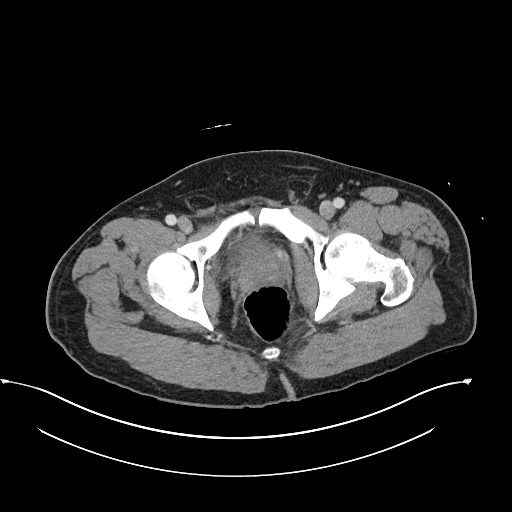
[im 35/112  soft-tissue]
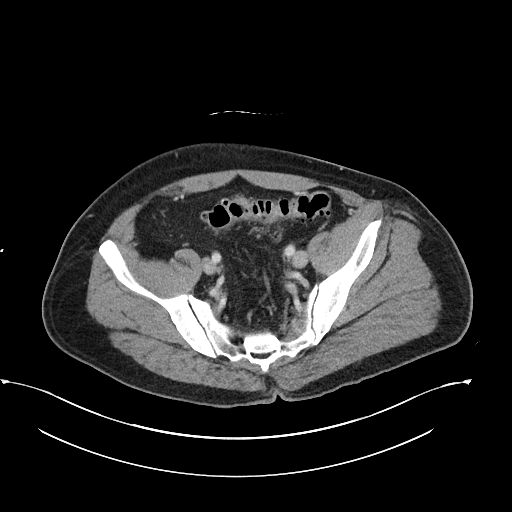
[im 42/112  soft-tissue]
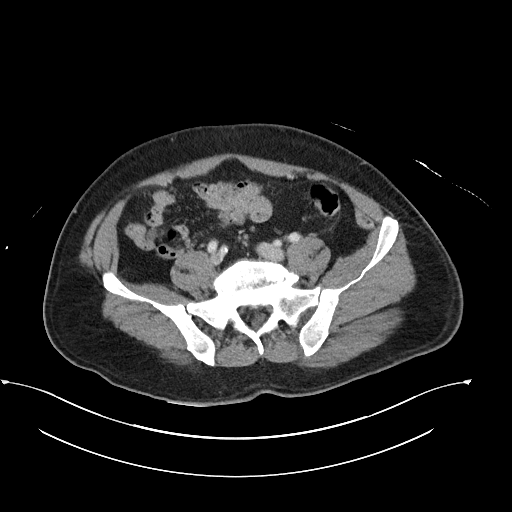
[im 49/112  soft-tissue]
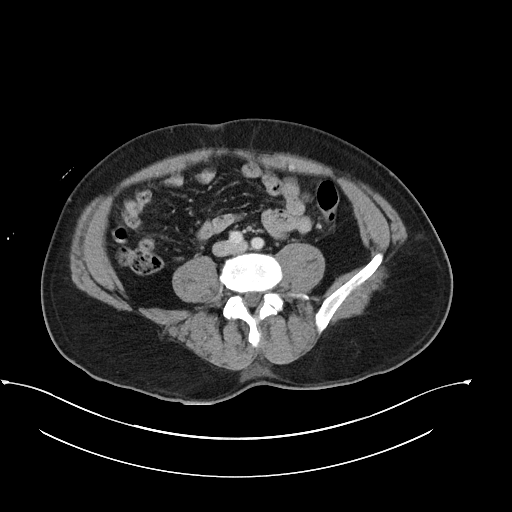
[im 56/112  soft-tissue]
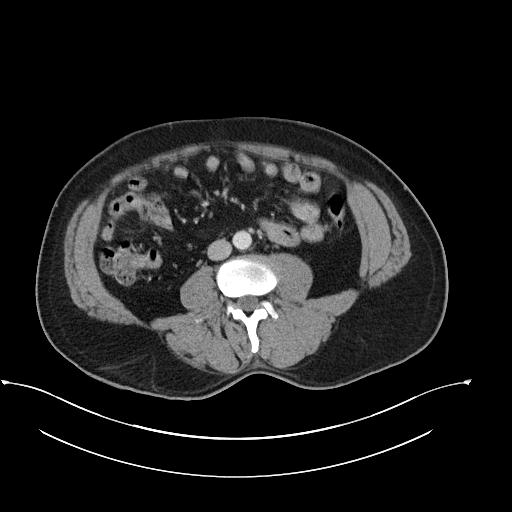
[im 63/112  soft-tissue]
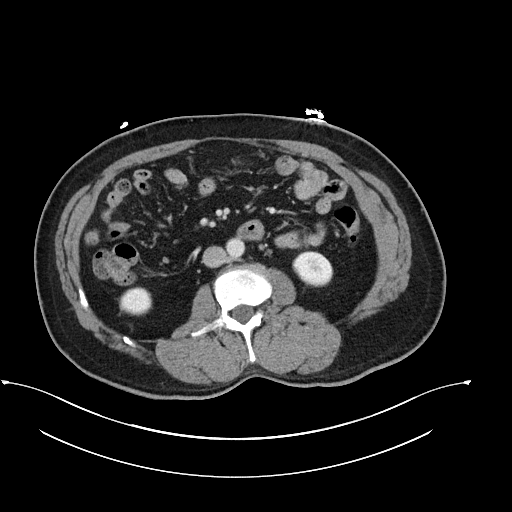
[im 70/112  soft-tissue]
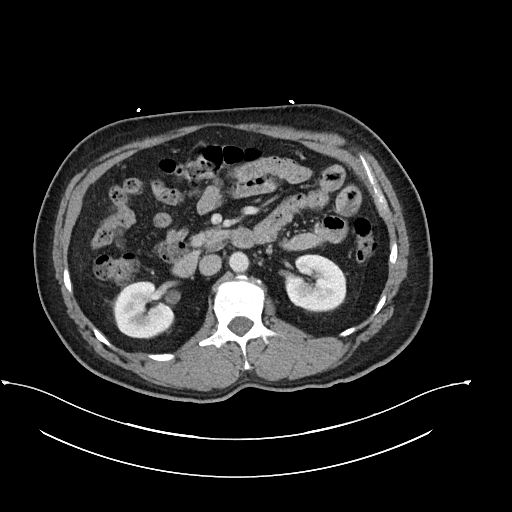
[im 70/112  bone]
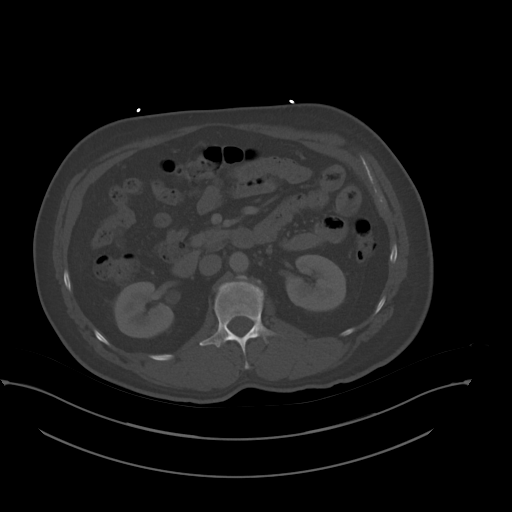
[im 77/112  soft-tissue]
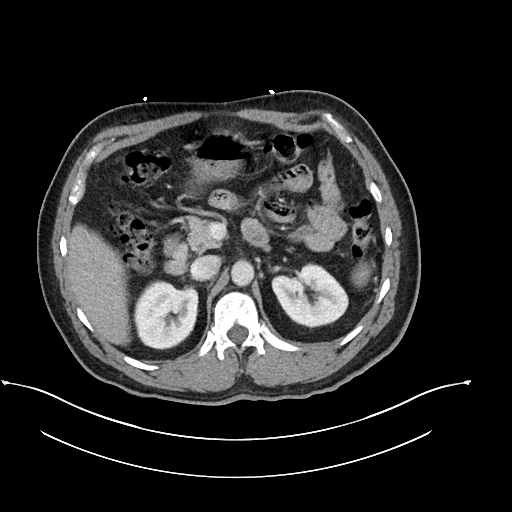
[im 91/112  soft-tissue]
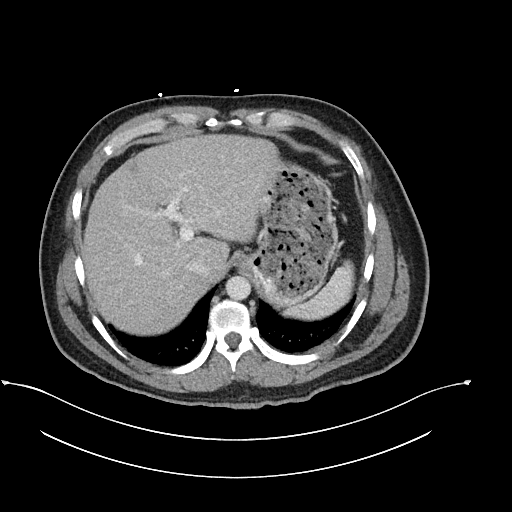
[im 98/112  soft-tissue]
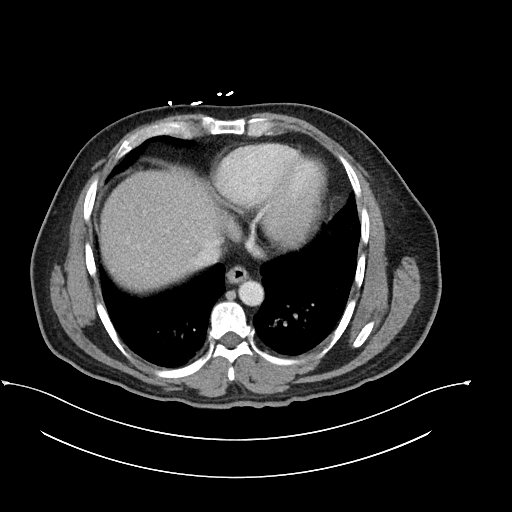
[im 105/112  soft-tissue]
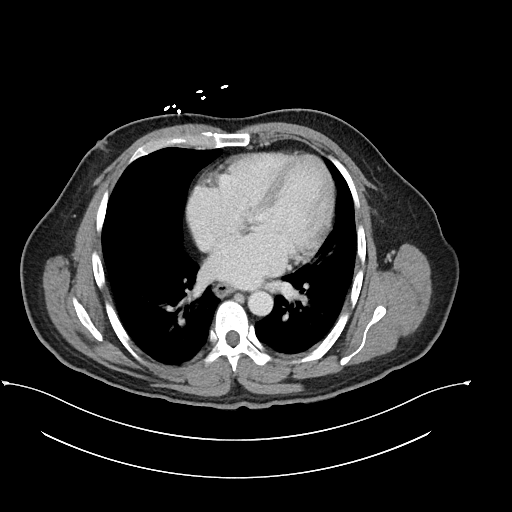

[Series 6: abdomen 3.0 mpr cor · coronal · 0.85mm/px · 3 of 101 slices shown]
[im 34/101  soft-tissue]
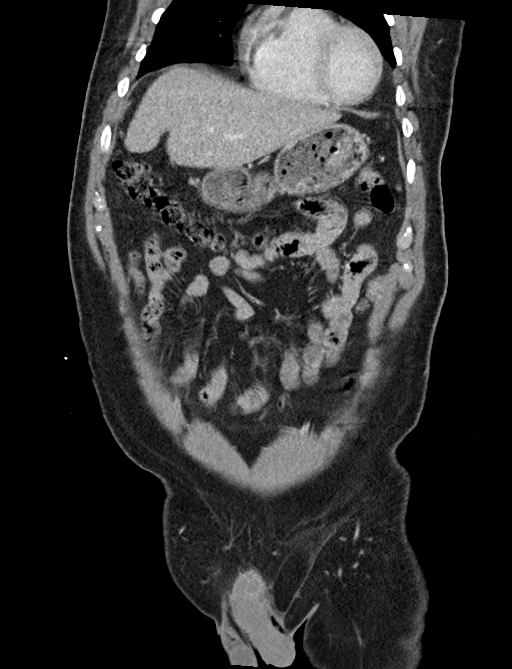
[im 45/101  soft-tissue]
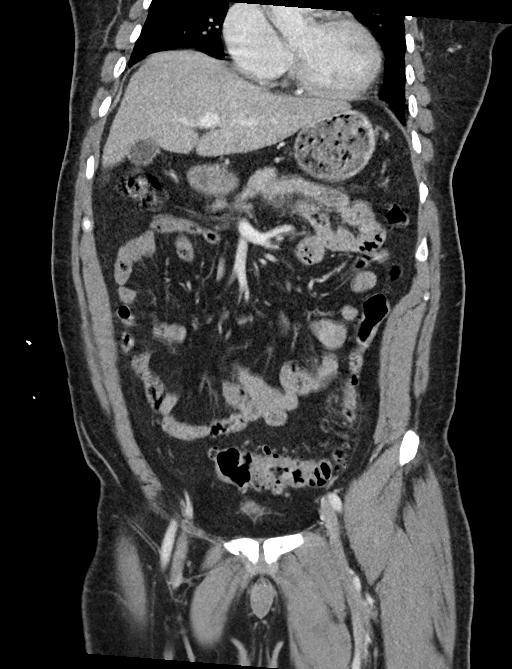
[im 56/101  soft-tissue]
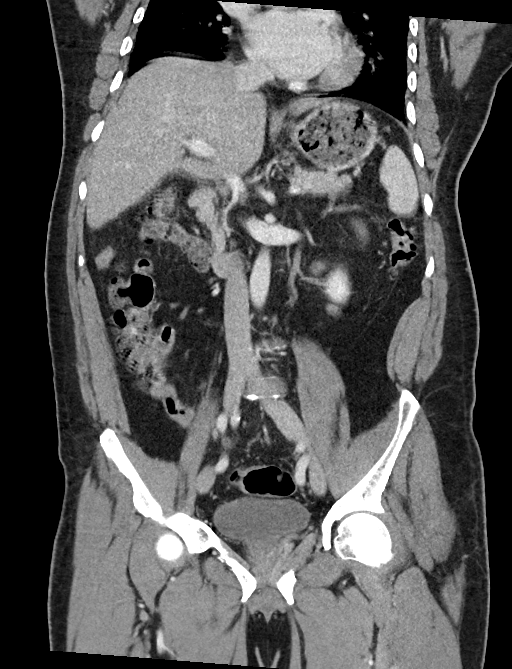

[16 of 46 positions shown; findings below may reference images not displayed]

FINDINGS: Lower chest: Cardiomegaly. No pericardial effusion. Respiratory
motion at the lung bases.

Dependent pulmonary atelectasis, but questionable also diffuse mild
pulmonary ground-glass opacity. However, no pleural fluid is present
at the lung bases.

Hepatobiliary: Borderline to mild hepatic steatosis. Otherwise
negative liver and gallbladder.

Pancreas: Negative.

Spleen: Negative.

Adrenals/Urinary Tract: Normal adrenal glands.

Bilateral renal enhancement and contrast excretion is symmetric and
normal. Normal course of both ureters.

Unremarkable urinary bladder.

Stomach/Bowel: Negative rectum.

Moderate to severe diverticulosis throughout the sigmoid colon and
distal descending colon. In the distal descending segment there is a
4-5 centimeter area of mesenteric stranding surrounding posteriorly
situated diverticula (series 3, image 68 and sagittal image 95. Mild
colonic wall thickening in the region. No extraluminal gas. No fluid
or fluid collection.

Negative splenic flexure. Negative transverse colon and right colon
aside from retained stool. Negative appendix. Negative terminal
ileum. No dilated small bowel. Negative stomach and duodenum.

No free air or free fluid in the abdomen.

Vascular/Lymphatic: Major arterial structures are patent with
minimal atherosclerosis. The portal venous system is patent.

No lymphadenopathy.

Reproductive: Negative.

Other: No pelvic free fluid.

Musculoskeletal: L5-S1 advanced disc degeneration with vacuum disc.
No acute osseous abnormality identified.
IMPRESSION: 1. Mild inflammation along a 5 cm segment of the distal descending
colon compatible with acute diverticulitis. No complicating
features. Underlying diverticulosis in the descending and sigmoid
colon.
2. Mild cardiomegaly.

## 2019-09-12 ENCOUNTER — Encounter (HOSPITAL_COMMUNITY): Payer: Self-pay

## 2019-09-12 ENCOUNTER — Emergency Department (HOSPITAL_COMMUNITY)
Admission: EM | Admit: 2019-09-12 | Discharge: 2019-09-12 | Disposition: A | Discharge status: Home / Self Care | Payer: Self-pay | Attending: Emergency Medicine | Admitting: Emergency Medicine

## 2019-09-12 ENCOUNTER — Emergency Department (HOSPITAL_BASED_OUTPATIENT_CLINIC_OR_DEPARTMENT_OTHER): Payer: Self-pay

## 2019-09-12 ENCOUNTER — Other Ambulatory Visit: Payer: Self-pay

## 2019-09-12 DIAGNOSIS — R2241 Localized swelling, mass and lump, right lower limb: Secondary | ICD-10-CM | POA: Insufficient documentation

## 2019-09-12 DIAGNOSIS — R739 Hyperglycemia, unspecified: Secondary | ICD-10-CM

## 2019-09-12 DIAGNOSIS — Y9301 Activity, walking, marching and hiking: Secondary | ICD-10-CM | POA: Insufficient documentation

## 2019-09-12 DIAGNOSIS — R52 Pain, unspecified: Secondary | ICD-10-CM

## 2019-09-12 DIAGNOSIS — M7989 Other specified soft tissue disorders: Secondary | ICD-10-CM

## 2019-09-12 DIAGNOSIS — S7011XA Contusion of right thigh, initial encounter: Secondary | ICD-10-CM

## 2019-09-12 DIAGNOSIS — X58XXXA Exposure to other specified factors, initial encounter: Secondary | ICD-10-CM | POA: Insufficient documentation

## 2019-09-12 DIAGNOSIS — Y990 Civilian activity done for income or pay: Secondary | ICD-10-CM | POA: Insufficient documentation

## 2019-09-12 DIAGNOSIS — Y929 Unspecified place or not applicable: Secondary | ICD-10-CM | POA: Insufficient documentation

## 2019-09-12 LAB — URINALYSIS, ROUTINE W REFLEX MICROSCOPIC
Bacteria, UA: NONE SEEN
Bilirubin Urine: NEGATIVE
Glucose, UA: >=500 mg/dL — AB
Hgb urine dipstick: NEGATIVE
Ketones, ur: NEGATIVE mg/dL
Leukocytes,Ua: NEGATIVE
Nitrite: NEGATIVE
Protein, ur: NEGATIVE mg/dL
Specific Gravity, Urine: 1.016 (ref 1.005–1.030)
pH: 7 (ref 5.0–8.0)

## 2019-09-12 LAB — CBC
HCT: 41.3 % (ref 39.0–52.0)
Hemoglobin: 14.1 g/dL (ref 13.0–17.0)
MCH: 32.6 pg (ref 26.0–34.0)
MCHC: 34.1 g/dL (ref 30.0–36.0)
MCV: 95.4 fL (ref 80.0–100.0)
Platelets: 189 10*3/uL (ref 150–400)
RBC: 4.33 MIL/uL (ref 4.22–5.81)
RDW: 13.1 % (ref 11.5–15.5)
WBC: 6.9 10*3/uL (ref 4.0–10.5)
nRBC: 0 % (ref 0.0–0.2)

## 2019-09-12 LAB — BASIC METABOLIC PANEL
Anion gap: 10 (ref 5–15)
BUN: 6 mg/dL (ref 6–20)
CO2: 19 mmol/L — ABNORMAL LOW (ref 22–32)
Calcium: 8.9 mg/dL (ref 8.9–10.3)
Chloride: 103 mmol/L (ref 98–111)
Creatinine, Ser: 0.8 mg/dL (ref 0.61–1.24)
GFR calc Af Amer: >60 mL/min (ref >60)
GFR calc non Af Amer: >60 mL/min (ref >60)
Glucose, Bld: 343 mg/dL — ABNORMAL HIGH (ref 70–99)
Potassium: 4 mmol/L (ref 3.5–5.1)
Sodium: 132 mmol/L — ABNORMAL LOW (ref 135–145)

## 2019-09-12 LAB — PROTIME-INR
INR: 1.1 (ref 0.8–1.2)
Prothrombin Time: 14.5 seconds (ref 11.4–15.2)

## 2019-09-12 MED ORDER — NAPROXEN 500 MG PO TABS: 500.0000 mg | ORAL_TABLET | Freq: Two times a day (BID) | ORAL | 0 refills | Status: DC

## 2019-09-12 NOTE — ED Triage Notes (Signed)
Spanish interpreter used for triage:   Pt reports bilateral leg and arm cramping for the past week. Denies any strenuous activity more than his normal. Pt ambulatory.

## 2019-09-12 NOTE — Discharge Instructions (Signed)
Your testing today shows no blood clot, your blood work was normal except for your blood sugar Your blood sugar was elevated, this can go up when you have food or drink that has sugar. You will need to have a fasting blood sugar drawn at a doctor's office to make sure you do not have diabetes.  Please see the phone number for the doctor's office above or call the doctor's office of your choice to follow-up to get follow-up to make sure you do not have diabetes Please apply ice packs on and off to your leg, wrap the ice packs in a towel before applying, this will help minimize the swelling. You may also take naproxen twice daily to help with the pain and swelling Your ultrasound showed no signs of blood clot! Emergency department for severe or worsening symptoms.

## 2019-09-12 NOTE — ED Notes (Signed)
Pt has bruising to right inner thigh and right lower leg.  Pt denies injury or any pain.  Strong pedal pulse present

## 2019-09-12 NOTE — ED Notes (Signed)
Venous doppler at bedside.

## 2019-09-12 NOTE — Progress Notes (Signed)
Right lower extremity venous duplex complete.  Preliminary results given to Eber Hong @ 16:00 and can be found in CV Proc tab. Levin Bacon- RDMS, RVT 6:01 PM  09/12/2019

## 2019-09-12 NOTE — ED Provider Notes (Signed)
[place] Provider Note   CSN: [id number] Arrival date & time: 09/12/19  1229     History Chief Complaint  Patient presents with  . Leg Pain  . Arm Pain    Ian Pham is a 54 y.o. male.  HPI    This patient is a pleasant 54 year old male, Spanish only speaking, translator was used through the entire interview.  This patient denies having any chronic medical conditions, takes no daily medications, reports that he has no allergies to medications.  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  Ever since that time he has had ongoing pain which seems to be constant, worse with range of motion of the leg and walking and associated with some swelling and bruising of his leg behind the knee to the medial distal thigh and his posterior mid thigh.  He has a minimal amount of pain in his buttock and no pain in his back.  He denies any focal trauma, denies being on any anticoagulants though he does take as needed Tylenol and Advil.  He does not take any aspirin-containing products or anticoagulants.  He has no history of easy bruising or bleeding and does not have any history of DVT or venous thromboembolism.  He denies chest pain or shortness of breath and has had no fevers or chills.  He wanted to get checked because of the progressive pain in his leg.  The pain definitely gets worse with moving, mild pain at rest.  Again denies any injuries.  I reviewed the medical record, there is no prior record of this patient in the system.  History reviewed. No pertinent past medical history.  There are no problems to display for this patient.   History reviewed. No pertinent surgical history.     No family history on file.  Social History   Tobacco Use  . Smoking status: Not on file  Substance Use Topics  . Alcohol use:  Not on file  . Drug use: Not on file    Home Medications Prior to Admission medications   Medication Sig Start Date End Date Taking? Authorizing Provider  naproxen (NAPROSYN) 500 MG tablet Take 1 tablet (500 mg total) by mouth 2 (two) times daily with a meal. 09/12/19   Noemi Chapel, MD    Allergies    Patient has no allergy information on record.  Review of Systems   Review of Systems  All other systems reviewed and are negative.   Physical Exam Updated Vital Signs BP 132/78   Pulse (!) 106   Temp 98.6 F (37 C) (Oral)   Resp 15   Ht 1.524 m (5')   Wt 129.3 kg   SpO2 97%   BMI 55.66 kg/m   Physical Exam Vitals and nursing note reviewed.  Constitutional:      General: He is not in acute distress.    Appearance: He is well-developed.  HENT:     Head: Normocephalic and atraumatic.     Mouth/Throat:     Pharynx: No oropharyngeal exudate.  Eyes:     General: No scleral icterus.       Right eye: No discharge.        Left eye: No discharge.     Conjunctiva/sclera: Conjunctivae normal.     Pupils: Pupils are equal, round, and  reactive to light.  Neck:     Thyroid: No thyromegaly.     Vascular: No JVD.  Cardiovascular:     Rate and Rhythm: Normal rate and regular rhythm.     Heart sounds: Normal heart sounds. No murmur. No friction rub. No gallop.      Comments: The patient has normal pulses at the bilateral radial arteries, the bilateral femoral arteries and the bilateral pedal arteries both at the dorsalis pedis and the posterior tibialis. Pulmonary:     Effort: Pulmonary effort is normal. No respiratory distress.     Breath sounds: Normal breath sounds. No wheezing or rales.  Abdominal:     General: Bowel sounds are normal. There is no distension.     Palpations: Abdomen is soft. There is no mass.     Tenderness: There is no abdominal tenderness.     Comments: Obese but nontender abdomen, no masses palpated  Musculoskeletal:        General: Swelling and  tenderness present. No deformity. Normal range of motion.     Cervical back: Normal range of motion and neck supple.     Right lower leg: No edema.     Left lower leg: No edema.  Lymphadenopathy:     Cervical: No cervical adenopathy.  Skin:    General: Skin is warm and dry.     Findings: Bruising present. No erythema or rash.  Neurological:     Mental Status: He is alert.     Coordination: Coordination normal.     Comments: The patient has normal strength and sensation of the bilateral lower extremities, symmetrical exam, bilateral upper extremities are normal, speech is normal, no facial droop.  Psychiatric:        Behavior: Behavior normal.     ED Results / Procedures / Treatments   Labs (all labs ordered are listed, but only abnormal results are displayed) Labs Reviewed  BASIC METABOLIC PANEL - Abnormal; Notable for the following components:      Result Value   Sodium 132 (*)    CO2 19 (*)    Glucose, Bld 343 (*)    All other components within normal limits  URINALYSIS, ROUTINE W REFLEX MICROSCOPIC - Abnormal; Notable for the following components:   Glucose, UA >=500 (*)    All other components within normal limits  CBC  PROTIME-INR    EKG None  Radiology No results found.  Procedures Procedures (including critical care time)  Medications Ordered in ED Medications - No data to display  ED Course  I have reviewed the triage vital signs and the nursing notes.  Pertinent labs & imaging results that were available during my care of the patient were reviewed by me and considered in my medical decision making (see chart for details).  Clinical Course as of Sep 14 1321  Tue Sep 12, 2019  1641 The ultrasound technician reports that there is no signs of DVT, the vascular structures in the leg looked good.  This is reassuring.  There is no signs of anemia, INR pending.   [BM]    Clinical Course User Index [BM] Eber Hong, MD   MDM Rules/Calculators/A&P                       This patient does not appear to be in acute distress however he has bruising and swelling of his right leg which is unclear as to the etiology.  The way that this came on acutely  while he was walking without any specific inciting event to suggest a possible vascular problem, he states he has never had any bruising or swelling like this in the past.  His pulses are normal, he is starting to swell in the right mid thigh.  Will obtain an ultrasound to evaluate the vasculature of his thigh however this patient appears to be in no distress and has no chest pain coughing or shortness of breath.  Will obtain labs to evaluate for his blood counts, and INR to evaluate for possible coagulopathy, overall this patient appears stable.  His glucose was elevated at 343, no anion gap acidosis, no ketones in the urine, he does have some glucose in the urine, the patient is not fasting  Proceed with US of the leg and labs, Pt agreeable.  Final Clinical Impression(s) / ED Diagnoses Final diagnoses:  Contusion of right thigh, initial encounter  Hyperglycemia    Rx / DC Orders ED Discharge Orders         Ordered    naproxen (NAPROSYN) 500 MG tablet  2 times daily with meals     09/12/19 1728           Eber Hong, MD 09/15/19 1323

## 2020-09-01 ENCOUNTER — Encounter (HOSPITAL_COMMUNITY): Payer: Self-pay

## 2020-09-01 ENCOUNTER — Emergency Department (HOSPITAL_COMMUNITY)
Admission: EM | Admit: 2020-09-01 | Discharge: 2020-09-01 | Disposition: A | Discharge status: Home / Self Care | Payer: HRSA Program | Attending: Emergency Medicine | Admitting: Emergency Medicine

## 2020-09-01 ENCOUNTER — Other Ambulatory Visit: Payer: Self-pay

## 2020-09-01 DIAGNOSIS — U071 COVID-19: Secondary | ICD-10-CM | POA: Diagnosis not present

## 2020-09-01 DIAGNOSIS — R519 Headache, unspecified: Secondary | ICD-10-CM | POA: Diagnosis present

## 2020-09-01 LAB — SARS CORONAVIRUS 2 BY RT PCR (HOSPITAL ORDER, PERFORMED IN ~~LOC~~ HOSPITAL LAB): SARS Coronavirus 2: POSITIVE — AB

## 2020-09-01 NOTE — ED Provider Notes (Signed)
[facility] EMERGENCY DEPARTMENT Provider Note   CSN: [id number] Arrival date & time: 09/01/20  1318     History No chief complaint on file.   Ian Pham is a 55 y.o. male who presents today for evaluation of 5 days of body wide pain, headache, dry cough, and fatigue.  He chose not to get vaccinated against Covid.  He reports that he has occasionally tried Tylenol with mild relief of his symptoms.  He denies any significant chest pain or shortness of breath.  He denies any rashes or leg swelling.  He denies specific abdominal pain, N/V/D.   He wanted to get checked today because he is concerned he may have coronavirus.  HPI     History reviewed. No pertinent past medical history.  There are no problems to display for this patient.   History reviewed. No pertinent surgical history.     No family history on file.  Social History   Tobacco Use  . Smoking status: Never Smoker  Substance Use Topics  . Alcohol use: Yes    Home Medications Prior to Admission medications   Medication Sig Start Date End Date Taking? Authorizing Provider  ciprofloxacin (CIPRO) 500 MG tablet Take 1 tablet (500 mg total) by mouth 2 (two) times daily. 09/24/17   Lawyer, Cristal Deer, PA-C  HYDROcodone-acetaminophen (NORCO/VICODIN) 5-325 MG tablet Take 1 tablet by mouth every 6 (six) hours as needed for moderate pain. 09/24/17   Lawyer, Cristal Deer, PA-C  ibuprofen (ADVIL,MOTRIN) 200 MG tablet Take 200 mg by mouth every 6 (six) hours as needed for moderate pain.    [provider]  methocarbamol (ROBAXIN) 500 MG tablet Take 1 tablet (500 mg total) by mouth 2 (two) times daily. Patient not taking: Reported on 07/07/2017 01/20/16   Garlon Hatchet, PA-C  metroNIDAZOLE (FLAGYL) 500 MG tablet Take 1 tablet (500 mg total) by mouth 2 (two) times daily. 09/24/17   Lawyer, Cristal Deer, PA-C  naproxen (NAPROSYN) 500 MG tablet Take 1 tablet (500 mg total) by mouth 2 (two) times daily  with a meal. Patient not taking: Reported on 07/07/2017 01/20/16   Garlon Hatchet, PA-C  promethazine (PHENERGAN) 25 MG tablet Take 1 tablet (25 mg total) by mouth every 6 (six) hours as needed for nausea or vomiting. 09/24/17   Lawyer, Cristal Deer, PA-C    Allergies    Patient has no known allergies.  Review of Systems   Review of Systems  Constitutional: Positive for chills, fatigue and fever.  Eyes: Negative for visual disturbance.  Respiratory: Positive for cough. Negative for shortness of breath.   Cardiovascular: Negative for chest pain and leg swelling.  Gastrointestinal: Negative for abdominal pain, diarrhea, nausea and vomiting.  Musculoskeletal: Positive for arthralgias and myalgias.  Skin: Negative for color change and rash.  Neurological: Positive for headaches. Negative for weakness.  All other systems reviewed and are negative.   Physical Exam Updated Vital Signs BP 135/80 (BP Location: Right Arm)   Pulse (!) 101   Temp 98.3 F (36.8 C) (Oral)   Resp 18   SpO2 96%   Physical Exam Vitals and nursing note reviewed.  Constitutional:      General: He is not in acute distress.    Appearance: He is not ill-appearing or diaphoretic.  HENT:     Head: Normocephalic and atraumatic.  Eyes:     General: No scleral icterus.       Right eye: No discharge.        Left  eye: No discharge.     Conjunctiva/sclera: Conjunctivae normal.  Cardiovascular:     Rate and Rhythm: Normal rate and regular rhythm.     Pulses: Normal pulses.  Pulmonary:     Effort: Pulmonary effort is normal. No respiratory distress.     Breath sounds: No stridor.  Abdominal:     General: There is no distension.  Musculoskeletal:        General: No deformity.     Cervical back: Normal range of motion and neck supple.     Comments: No obvious acute injury  Skin:    General: Skin is warm and dry.  Neurological:     Mental Status: He is alert.     Motor: No abnormal muscle tone.     Comments:  Awake and alert, answers all questions appropriately.  Speech is not slurred.  Psychiatric:        Mood and Affect: Mood normal.        Behavior: Behavior normal.     ED Results / Procedures / Treatments   Labs (all labs ordered are listed, but only abnormal results are displayed) Labs Reviewed  SARS CORONAVIRUS 2 BY RT PCR (HOSPITAL ORDER, PERFORMED IN [facility]) - Abnormal; Notable for the following components:      Result Value   SARS Coronavirus 2 POSITIVE (*)    All other components within normal limits    EKG None  Radiology No results found.  Procedures Procedures   Medications Ordered in ED Medications - No data to display  ED Course  I have reviewed the triage vital signs and the nursing notes.  Pertinent labs & imaging results that were available during my care of the patient were reviewed by me and considered in my medical decision making (see chart for details).    MDM Rules/Calculators/A&P                         Patient is a 55 year old man who presents today for evaluation of 5 days of Covid-like symptoms.  Here his Covid test is positive.  He chose not to get vaccinated.  Ambulatory referral for consideration of Covid treatment is placed.  I personally had him ambulate at bedside, he did not have significant symptoms and his oxygen remained above 96%. We discussed appropriate outpatient therapy.  No indication for admission at this time. Additionally we discussed the possibility of his symptoms progressing and getting worse that may require hospitalization however we discussed that it is difficult to predict at this and he does not currently require hospitalization.  Return precautions discussed through professional Spanish-speaking interpreter.  His blood pressure was mildly elevated while in the emergency room.  He does not have a PCP and does not have a formal hypertension diagnosis.  Discussed the importance of obtaining primary care doctor.   We discussed that if his blood pressure is usually high and it is not getting treated then he is having damage done to his organs and he states his understanding.  Ian Pham was evaluated in Emergency Department on 09/01/2020 for the symptoms described in the history of present illness. He was evaluated in the context of the global COVID-19 pandemic, which necessitated consideration that the patient might be at risk for infection with the SARS-CoV-2 virus that causes COVID-19. Institutional protocols and algorithms that pertain to the evaluation of patients at risk for COVID-19 are in a state of rapid change based on information released  by regulatory bodies including the CDC and federal and state organizations. These policies and algorithms were followed during the patient's care in the ED.  Return precautions were discussed with patient who states their understanding.  At the time of discharge patient denied any unaddressed complaints or concerns.  Patient is agreeable for discharge home.  Note: Portions of this report may have been transcribed using voice recognition software. Every effort was made to ensure accuracy; however, inadvertent computerized transcription errors may be present   Final Clinical Impression(s) / ED Diagnoses Final diagnoses:  COVID-19    Rx / DC Orders ED Discharge Orders         Ordered    Ambulatory Referral for Covid Treatment        09/01/20 1640           Norman Clay 09/01/20 1651    Charlynne Pander, MD 09/01/20 2130

## 2020-09-01 NOTE — ED Triage Notes (Signed)
Patient complains of body aches and headache x 5 days, denies fever. Has not been vaccinated. NAD

## 2020-09-02 ENCOUNTER — Telehealth: Payer: Self-pay | Admitting: Physician Assistant

## 2020-09-02 NOTE — Telephone Encounter (Signed)
Called to discuss with patient about COVID-19 symptoms and the use of one of the available treatments for those with mild to moderate Covid symptoms and at a high risk of hospitalization.  Pt appears to qualify for outpatient treatment due to co-morbid conditions and/or a member of an at-risk group in accordance with the FDA Emergency Use Authorization.    Symptom onset: ~2/1  Vaccinated: NO Booster? NO Immunocompromised? NO Qualifiers: elevated BMI, elevated SVI  Unable to reach pt - left VM    Cline Crock

## 2020-09-23 ENCOUNTER — Emergency Department (HOSPITAL_COMMUNITY): Payer: Self-pay

## 2020-09-23 ENCOUNTER — Other Ambulatory Visit: Payer: Self-pay

## 2020-09-23 ENCOUNTER — Inpatient Hospital Stay (HOSPITAL_COMMUNITY)
Admission: EM | Admit: 2020-09-23 | Discharge: 2020-09-28 | DRG: 193 | Disposition: A | Discharge status: Home / Self Care | Payer: Self-pay | Attending: Internal Medicine | Admitting: Internal Medicine

## 2020-09-23 ENCOUNTER — Encounter (HOSPITAL_COMMUNITY): Payer: Self-pay | Admitting: Emergency Medicine

## 2020-09-23 DIAGNOSIS — R000 Tachycardia, unspecified: Secondary | ICD-10-CM | POA: Diagnosis present

## 2020-09-23 DIAGNOSIS — J189 Pneumonia, unspecified organism: Secondary | ICD-10-CM

## 2020-09-23 DIAGNOSIS — J9601 Acute respiratory failure with hypoxia: Secondary | ICD-10-CM | POA: Diagnosis present

## 2020-09-23 DIAGNOSIS — Y92239 Unspecified place in hospital as the place of occurrence of the external cause: Secondary | ICD-10-CM | POA: Diagnosis not present

## 2020-09-23 DIAGNOSIS — R002 Palpitations: Secondary | ICD-10-CM | POA: Diagnosis present

## 2020-09-23 DIAGNOSIS — E1165 Type 2 diabetes mellitus with hyperglycemia: Secondary | ICD-10-CM | POA: Diagnosis not present

## 2020-09-23 DIAGNOSIS — Z20822 Contact with and (suspected) exposure to covid-19: Secondary | ICD-10-CM

## 2020-09-23 DIAGNOSIS — R0789 Other chest pain: Secondary | ICD-10-CM

## 2020-09-23 DIAGNOSIS — R0902 Hypoxemia: Secondary | ICD-10-CM | POA: Insufficient documentation

## 2020-09-23 DIAGNOSIS — E8881 Metabolic syndrome: Secondary | ICD-10-CM | POA: Diagnosis present

## 2020-09-23 DIAGNOSIS — J159 Unspecified bacterial pneumonia: Principal | ICD-10-CM | POA: Diagnosis present

## 2020-09-23 DIAGNOSIS — K7460 Unspecified cirrhosis of liver: Secondary | ICD-10-CM | POA: Clinically undetermined

## 2020-09-23 DIAGNOSIS — R0600 Dyspnea, unspecified: Secondary | ICD-10-CM

## 2020-09-23 DIAGNOSIS — T380X5A Adverse effect of glucocorticoids and synthetic analogues, initial encounter: Secondary | ICD-10-CM | POA: Diagnosis not present

## 2020-09-23 DIAGNOSIS — U099 Post covid-19 condition, unspecified: Secondary | ICD-10-CM | POA: Diagnosis present

## 2020-09-23 DIAGNOSIS — U071 COVID-19: Secondary | ICD-10-CM | POA: Diagnosis present

## 2020-09-23 DIAGNOSIS — F1020 Alcohol dependence, uncomplicated: Secondary | ICD-10-CM | POA: Diagnosis present

## 2020-09-23 DIAGNOSIS — I10 Essential (primary) hypertension: Secondary | ICD-10-CM | POA: Diagnosis present

## 2020-09-23 DIAGNOSIS — M549 Dorsalgia, unspecified: Secondary | ICD-10-CM | POA: Diagnosis present

## 2020-09-23 DIAGNOSIS — Z6841 Body Mass Index (BMI) 40.0 and over, adult: Secondary | ICD-10-CM

## 2020-09-23 LAB — CBC
HCT: 38.7 % — ABNORMAL LOW (ref 39.0–52.0)
Hemoglobin: 13.8 g/dL (ref 13.0–17.0)
MCH: 33.5 pg (ref 26.0–34.0)
MCHC: 35.7 g/dL (ref 30.0–36.0)
MCV: 93.9 fL (ref 80.0–100.0)
Platelets: 153 10*3/uL (ref 150–400)
RBC: 4.12 MIL/uL — ABNORMAL LOW (ref 4.22–5.81)
RDW: 13.2 % (ref 11.5–15.5)
WBC: 6.9 10*3/uL (ref 4.0–10.5)
nRBC: 0 % (ref 0.0–0.2)

## 2020-09-23 LAB — LIPID PANEL
Cholesterol: 160 mg/dL (ref 0–200)
HDL: 50 mg/dL (ref >40)
LDL Cholesterol: 88 mg/dL (ref 0–99)
Total CHOL/HDL Ratio: 3.2 RATIO
Triglycerides: 111 mg/dL (ref <150)
VLDL: 22 mg/dL (ref 0–40)

## 2020-09-23 LAB — RESP PANEL BY RT-PCR (FLU A&B, COVID) ARPGX2
Influenza A by PCR: NEGATIVE
Influenza B by PCR: NEGATIVE
SARS Coronavirus 2 by RT PCR: NEGATIVE

## 2020-09-23 LAB — BASIC METABOLIC PANEL
Anion gap: 11 (ref 5–15)
BUN: 9 mg/dL (ref 6–20)
CO2: 21 mmol/L — ABNORMAL LOW (ref 22–32)
Calcium: 9 mg/dL (ref 8.9–10.3)
Chloride: 99 mmol/L (ref 98–111)
Creatinine, Ser: 0.74 mg/dL (ref 0.61–1.24)
GFR, Estimated: >60 mL/min (ref >60)
Glucose, Bld: 300 mg/dL — ABNORMAL HIGH (ref 70–99)
Potassium: 3.8 mmol/L (ref 3.5–5.1)
Sodium: 131 mmol/L — ABNORMAL LOW (ref 135–145)

## 2020-09-23 LAB — TROPONIN I (HIGH SENSITIVITY)
Troponin I (High Sensitivity): 7 ng/L (ref <18)
Troponin I (High Sensitivity): 7 ng/L (ref <18)

## 2020-09-23 LAB — FERRITIN: Ferritin: 280 ng/mL (ref 24–336)

## 2020-09-23 LAB — HIV ANTIBODY (ROUTINE TESTING W REFLEX): HIV Screen 4th Generation wRfx: NONREACTIVE

## 2020-09-23 LAB — FIBRINOGEN: Fibrinogen: 342 mg/dL (ref 210–475)

## 2020-09-23 LAB — HEPATIC FUNCTION PANEL
ALT: 26 U/L (ref 0–44)
AST: 54 U/L — ABNORMAL HIGH (ref 15–41)
Albumin: 2.5 g/dL — ABNORMAL LOW (ref 3.5–5.0)
Alkaline Phosphatase: 176 U/L — ABNORMAL HIGH (ref 38–126)
Bilirubin, Direct: 0.6 mg/dL — ABNORMAL HIGH (ref 0.0–0.2)
Indirect Bilirubin: 0.8 mg/dL (ref 0.3–0.9)
Total Bilirubin: 1.4 mg/dL — ABNORMAL HIGH (ref 0.3–1.2)
Total Protein: 8.1 g/dL (ref 6.5–8.1)

## 2020-09-23 LAB — HEMOGLOBIN A1C
Hgb A1c MFr Bld: 7.2 % — ABNORMAL HIGH (ref 4.8–5.6)
Mean Plasma Glucose: 159.94 mg/dL

## 2020-09-23 LAB — D-DIMER, QUANTITATIVE: D-Dimer, Quant: 0.49 ug/mL-FEU (ref 0.00–0.50)

## 2020-09-23 LAB — LACTIC ACID, PLASMA
Lactic Acid, Venous: 1.3 mmol/L (ref 0.5–1.9)
Lactic Acid, Venous: 1.2 mmol/L (ref 0.5–1.9)

## 2020-09-23 LAB — PROCALCITONIN: Procalcitonin: <0.1 ng/mL

## 2020-09-23 LAB — BRAIN NATRIURETIC PEPTIDE: B Natriuretic Peptide: 85.1 pg/mL (ref 0.0–100.0)

## 2020-09-23 LAB — C-REACTIVE PROTEIN: CRP: <0.5 mg/dL (ref <1.0)

## 2020-09-23 LAB — TRIGLYCERIDES: Triglycerides: 125 mg/dL (ref <150)

## 2020-09-23 LAB — CBG MONITORING, ED: Glucose-Capillary: 235 mg/dL — ABNORMAL HIGH (ref 70–99)

## 2020-09-23 LAB — LACTATE DEHYDROGENASE: LDH: 224 U/L — ABNORMAL HIGH (ref 98–192)

## 2020-09-23 MED ORDER — LISINOPRIL 5 MG PO TABS
5.0000 mg | ORAL_TABLET | Freq: Every day | ORAL | Status: DC
  Administered 2020-09-23 – 2020-09-25 (×3): 5 mg via ORAL
  Filled 2020-09-23 (×3): qty 1

## 2020-09-23 MED ORDER — ADULT MULTIVITAMIN W/MINERALS CH
1.0000 | ORAL_TABLET | Freq: Every day | ORAL | Status: DC
  Administered 2020-09-23 – 2020-09-28 (×6): 1 via ORAL
  Filled 2020-09-23 (×6): qty 1

## 2020-09-23 MED ORDER — PREDNISONE 5 MG PO TABS: 50.0000 mg | ORAL_TABLET | Freq: Every day | ORAL | Status: DC

## 2020-09-23 MED ORDER — SODIUM CHLORIDE 0.9 % IV SOLN
200.0000 mg | Freq: Once | INTRAVENOUS | Status: AC
  Administered 2020-09-23: 200 mg via INTRAVENOUS
  Filled 2020-09-23: qty 40

## 2020-09-23 MED ORDER — CARVEDILOL 3.125 MG PO TABS: 3.1250 mg | ORAL_TABLET | Freq: Two times a day (BID) | ORAL | Status: DC

## 2020-09-23 MED ORDER — ENOXAPARIN SODIUM 40 MG/0.4ML ~~LOC~~ SOLN
40.0000 mg | Freq: Every day | SUBCUTANEOUS | Status: DC
  Administered 2020-09-23 – 2020-09-24 (×2): 40 mg via SUBCUTANEOUS
  Filled 2020-09-23 (×2): qty 0.4

## 2020-09-23 MED ORDER — INSULIN ASPART 100 UNIT/ML ~~LOC~~ SOLN
0.0000 [IU] | Freq: Three times a day (TID) | SUBCUTANEOUS | Status: DC
  Administered 2020-09-24 (×3): 8 [IU] via SUBCUTANEOUS
  Administered 2020-09-25: 3 [IU] via SUBCUTANEOUS
  Administered 2020-09-25 (×2): 11 [IU] via SUBCUTANEOUS
  Administered 2020-09-26: 8 [IU] via SUBCUTANEOUS
  Administered 2020-09-26: 3 [IU] via SUBCUTANEOUS
  Administered 2020-09-26: 8 [IU] via SUBCUTANEOUS
  Administered 2020-09-27: 3 [IU] via SUBCUTANEOUS
  Administered 2020-09-27: 8 [IU] via SUBCUTANEOUS
  Administered 2020-09-27: 3 [IU] via SUBCUTANEOUS

## 2020-09-23 MED ORDER — INSULIN ASPART 100 UNIT/ML ~~LOC~~ SOLN
0.0000 [IU] | Freq: Every day | SUBCUTANEOUS | Status: DC
  Administered 2020-09-23: 2 [IU] via SUBCUTANEOUS
  Administered 2020-09-24: 3 [IU] via SUBCUTANEOUS
  Administered 2020-09-25: 5 [IU] via SUBCUTANEOUS
  Administered 2020-09-26: 4 [IU] via SUBCUTANEOUS
  Administered 2020-09-27: 2 [IU] via SUBCUTANEOUS

## 2020-09-23 MED ORDER — IOHEXOL 350 MG/ML SOLN
60.0000 mL | Freq: Once | INTRAVENOUS | Status: AC | PRN
  Administered 2020-09-23: 60 mL via INTRAVENOUS

## 2020-09-23 MED ORDER — GUAIFENESIN-DM 100-10 MG/5ML PO SYRP
10.0000 mL | ORAL_SOLUTION | ORAL | Status: DC | PRN
Start: 2020-09-23 — End: 2020-09-28

## 2020-09-23 MED ORDER — LIDOCAINE HCL (PF) 1 % IJ SOLN: 5.0000 mL | Freq: Once | INTRAMUSCULAR | Status: DC

## 2020-09-23 MED ORDER — ACETAMINOPHEN 325 MG PO TABS
650.0000 mg | ORAL_TABLET | Freq: Four times a day (QID) | ORAL | Status: DC | PRN
  Administered 2020-09-27: 650 mg via ORAL
  Filled 2020-09-23: qty 2

## 2020-09-23 MED ORDER — LIDOCAINE 5 % EX PTCH
1.0000 | MEDICATED_PATCH | CUTANEOUS | Status: DC
  Administered 2020-09-23 – 2020-09-27 (×4): 1 via TRANSDERMAL
  Filled 2020-09-23 (×4): qty 1

## 2020-09-23 MED ORDER — FOLIC ACID 1 MG PO TABS
1.0000 mg | ORAL_TABLET | Freq: Every day | ORAL | Status: DC
  Administered 2020-09-23 – 2020-09-28 (×6): 1 mg via ORAL
  Filled 2020-09-23 (×6): qty 1

## 2020-09-23 MED ORDER — METOPROLOL TARTRATE 25 MG PO TABS
25.0000 mg | ORAL_TABLET | Freq: Two times a day (BID) | ORAL | Status: DC
  Administered 2020-09-23 – 2020-09-28 (×10): 25 mg via ORAL
  Filled 2020-09-23 (×10): qty 1

## 2020-09-23 MED ORDER — TETANUS-DIPHTH-ACELL PERTUSSIS 5-2.5-18.5 LF-MCG/0.5 IM SUSY: 0.5000 mL | PREFILLED_SYRINGE | Freq: Once | INTRAMUSCULAR | Status: DC

## 2020-09-23 MED ORDER — IPRATROPIUM-ALBUTEROL 20-100 MCG/ACT IN AERS
1.0000 | INHALATION_SPRAY | Freq: Four times a day (QID) | RESPIRATORY_TRACT | Status: DC
  Administered 2020-09-23 – 2020-09-28 (×18): 1 via RESPIRATORY_TRACT
  Filled 2020-09-23: qty 4

## 2020-09-23 MED ORDER — SODIUM CHLORIDE 0.9 % IV SOLN
1000.0000 mL | INTRAVENOUS | Status: DC
  Administered 2020-09-23 – 2020-09-25 (×3): 1000 mL via INTRAVENOUS

## 2020-09-23 MED ORDER — LORAZEPAM 1 MG PO TABS
1.0000 mg | ORAL_TABLET | ORAL | Status: AC | PRN
Start: 2020-09-23 — End: 2020-09-26
  Administered 2020-09-24: 1 mg via ORAL
  Filled 2020-09-23: qty 1

## 2020-09-23 MED ORDER — BARICITINIB 2 MG PO TABS
4.0000 mg | ORAL_TABLET | Freq: Every day | ORAL | Status: DC
  Administered 2020-09-23 – 2020-09-24 (×2): 4 mg via ORAL
  Filled 2020-09-23 (×2): qty 2

## 2020-09-23 MED ORDER — LORAZEPAM 2 MG/ML IJ SOLN: 1.0000 mg | INTRAMUSCULAR | Status: AC | PRN

## 2020-09-23 MED ORDER — METHYLPREDNISOLONE SODIUM SUCC 1000 MG IJ SOLR
1.0000 mg/kg | Freq: Two times a day (BID) | INTRAMUSCULAR | Status: AC
  Administered 2020-09-23 – 2020-09-26 (×6): 130 mg via INTRAVENOUS
  Filled 2020-09-23 (×9): qty 1.04

## 2020-09-23 MED ORDER — SODIUM CHLORIDE 0.9 % IV SOLN
100.0000 mg | Freq: Every day | INTRAVENOUS | Status: DC
  Administered 2020-09-24: 100 mg via INTRAVENOUS
  Filled 2020-09-23: qty 20

## 2020-09-23 MED ORDER — THIAMINE HCL 100 MG/ML IJ SOLN
100.0000 mg | Freq: Every day | INTRAMUSCULAR | Status: DC
  Administered 2020-09-23: 100 mg via INTRAVENOUS
  Filled 2020-09-23 (×2): qty 2

## 2020-09-23 MED ORDER — INSULIN GLARGINE 100 UNIT/ML ~~LOC~~ SOLN
10.0000 [IU] | Freq: Every day | SUBCUTANEOUS | Status: DC
  Administered 2020-09-23 – 2020-09-24 (×2): 10 [IU] via SUBCUTANEOUS
  Filled 2020-09-23 (×3): qty 0.1

## 2020-09-23 MED ORDER — THIAMINE HCL 100 MG PO TABS
100.0000 mg | ORAL_TABLET | Freq: Every day | ORAL | Status: DC
  Administered 2020-09-24 – 2020-09-28 (×5): 100 mg via ORAL
  Filled 2020-09-23 (×5): qty 1

## 2020-09-23 NOTE — ED Triage Notes (Signed)
Patient coming from home. Complaint of chest pain and shortness of breath upon exertion. Patient states a few weeks ago he thinks he had covid, and since hes been having these symptoms.

## 2020-09-23 NOTE — ED Provider Notes (Signed)
I assumed care of patient at shift change from previous team, please see their note for full H&P.  Briefly patient reports that he thinks he had Covid last week and states he is feeling better however still has chest pain and shortness of breath. Physical Exam  BP 133/80 (BP Location: Right Arm)   Pulse 96   Temp 99.3 F (37.4 C) (Oral)   Resp 20   SpO2 93%   Physical Exam   Patient is awake and alert.  His speech is not slurred.  He is slightly tachypneic at rest.  He is able to stand and ambulate next to the bed however becomes tachypneic with respiratory rates into the 40s and hypoxic with a good waveform to 88 on room air.    ED Course/Procedures     Procedures   DG Chest 2 View  Result Date: 09/23/2020 CLINICAL DATA:  Fever, chest pain and shortness of breath for 1 week EXAM: CHEST - 2 VIEW COMPARISON:  None FINDINGS: Borderline enlargement of cardiac silhouette. Mediastinal contours normal. Patchy BILATERAL pulmonary infiltrates consistent with multifocal pneumonia. No pleural effusion or pneumothorax. IMPRESSION: Patchy BILATERAL pulmonary infiltrates favor multifocal pneumonia. Electronically Signed   By: Ulyses Southward M.D.   On: 09/23/2020 13:06   CT Angio Chest PE W and/or Wo Contrast  Result Date: 09/23/2020 CLINICAL DATA:  Evaluate for acute pulmonary embolus. Short of breath. EXAM: CT ANGIOGRAPHY CHEST WITH CONTRAST TECHNIQUE: Multidetector CT imaging of the chest was performed using the standard protocol during bolus administration of intravenous contrast. Multiplanar CT image reconstructions and MIPs were obtained to evaluate the vascular anatomy. CONTRAST:  64mL OMNIPAQUE IOHEXOL 350 MG/ML SOLN COMPARISON:  None. FINDINGS: Cardiovascular: The main pulmonary artery is patent. No central obstructing pulmonary embolus. Respiratory motion artifact results in image degradation at the level of the lower lobe segmental pulmonary arteries. Within this limitation there are no signs of  lobar or segmental pulmonary artery embolus. Normal heart size. No pericardial effusion. Mediastinum/Nodes: Normal appearance of the thyroid gland. The trachea appears patent and is midline. Normal appearance of the esophagus. No enlarged axillary or supraclavicular lymph nodes. Prominent calcified and noncalcified mediastinal lymph nodes are identified. None of these meet CT criteria for adenopathy. There also prominent bilateral hilar nodes measuring up to 1 cm. Lungs/Pleura: No pleural effusion. Extensive bilateral ground-glass opacities with scattered areas of consolidation within both upper and lower lung zones. Upper Abdomen: No acute findings within the upper abdomen. Contour of the liver is slightly irregular. Relative hypertrophy of the lateral segment of left hepatic lobe and caudate lobe noted. Musculoskeletal: No chest wall abnormality. No acute or significant osseous findings. Review of the MIP images confirms the above findings. IMPRESSION: 1. Respiratory motion artifact results in image degradation at the level of the lower lobe segmental pulmonary arteries. Within this limitation there are no signs of lobar or segmental pulmonary artery embolus. 2. Extensive ground-glass opacities with scattered areas of consolidation noted throughout both lungs. Imaging findings concerning for multifocal pneumonia. Atypical viral infection not excluded. 3. Prominent mediastinal and hilar lymph nodes are likely reactive. 4. Morphologic features of the liver suggestive of early cirrhosis. Electronically Signed   By: Signa Kell M.D.   On: 09/23/2020 15:44     MDM  Plan is to follow-up on CTA PE study, delta troponin and ambulate.  Patient is not vaccinated against Covid.  He has been documented is saturating at 93% or 92%.  I personally ambulated patient at bedside and he  desaturated to 88% on room air with increased respiratory rate and work of breathing.  He is placed on 2 L of oxygen after which his sats  improved into the mid 90s.  I discussed with patient that I suspect he has Covid.  He states his symptoms started about 2 weeks ago.  Patient will require admission given his hypoxia in the setting of suspected Covid.  CTA PE study did not show PEs, does show extensive groundglass opacities with scattered area of consolidation concerning for extensive multifocal pneumonia.   Ian Pham was evaluated in Emergency Department on 09/23/2020 for the symptoms described in the history of present illness. He was evaluated in the context of the global COVID-19 pandemic, which necessitated consideration that the patient might be at risk for infection with the SARS-CoV-2 virus that causes COVID-19. Institutional protocols and algorithms that pertain to the evaluation of patients at risk for COVID-19 are in a state of rapid change based on information released by regulatory bodies including the CDC and federal and state organizations. These policies and algorithms were followed during the patient's care in the ED.   I spoke with hospitalist Dr. Chipper Herb who will see patient for admission.          Cristina Gong, PA-C 09/23/20 1657    Melene Plan, DO 09/23/20 1701

## 2020-09-23 NOTE — H&P (Signed)
History and Physical    Ian Pham GUY:[id number] DOB: 26-Jan-1966 DOA: 09/23/2020  PCP: Patient, No Pcp Per (Confirm with patient/family/NH records and if not entered, this has to be entered at [facility] point of entry) Patient coming from: Home  I have personally briefly reviewed patient's old medical records in [facility]  Chief Complaint: Cough, SOB  HPI: Ian Pham is a 55 y.o. male with no significant medical history scented with new onset of cough and shortness of breath.  Patient has a 11-year-old son tested positive for Covid 3 weeks ago.  Patient himself started to have breathing symptoms about 2 to 3 weeks ago, gradually getting worse, shortness of breath exertional at the beginning, but this week has been constant and even minimal activity of lifting or short distance walk can trigger significant shortness of breath.  Also has had dry cough mostly occasionally cough up with whitish sputum.  Feeling quite congested inside.  Also has had alternated subjective fever and chills.  He also developed a sharp back pain, worsening with cough.  Denies any chest pain. ED Course: O2 saturation dropped to 88% on minimal activity in ED.  Tachycardia.,  Blood pressure elevated.  Chest x-ray showed bilateral multifocal pneumonia, CT angiogram negative for PE but exam shows bilateral interstitial multifocal pneumonia compatible with COVID-19 pneumonia.  COVID-19 test pending.  Glucose more than 300.  Review of Systems: As per HPI otherwise 14 point review of systems negative.   History reviewed. No pertinent past medical history.  History reviewed. No pertinent surgical history.  Personal history, no history of smoking, drinks beer on weekends.  Last drink 3 days ago.  No Known Allergies  No family history on file.  Prior to Admission medications   Medication Sig Start Date End Date Taking? Authorizing Provider  naproxen (NAPROSYN) 500 MG tablet Take 1  tablet (500 mg total) by mouth 2 (two) times daily with a meal. Patient not taking: No sig reported 09/12/19   Eber Hong, MD    Physical Exam: Vitals:   09/23/20 1500 09/23/20 1530 09/23/20 1600 09/23/20 1615  BP: 137/83 130/77 (!) 147/90 (!) 148/90  Pulse: 91 88 (!) 103 85  Resp:   (!) 29 (!) 31  Temp:      TempSrc:      SpO2: 91% 91% 90% 95%    Constitutional: NAD, calm, comfortable Vitals:   09/23/20 1500 09/23/20 1530 09/23/20 1600 09/23/20 1615  BP: 137/83 130/77 (!) 147/90 (!) 148/90  Pulse: 91 88 (!) 103 85  Resp:   (!) 29 (!) 31  Temp:      TempSrc:      SpO2: 91% 91% 90% 95%   Eyes: PERRL, lids and conjunctivae normal ENMT: Mucous membranes are moist. Posterior pharynx clear of any exudate or lesions.Normal dentition.  Neck: normal, supple, no masses, no thyromegaly Respiratory: clear to auscultation bilaterally, scattered wheezing and coarse crackles on bilateral bases.  Increasing respiratory effort. No accessory muscle use.  Cardiovascular: Regular rate and rhythm, no murmurs / rubs / gallops. No extremity edema. 2+ pedal pulses. No carotid bruits.  Abdomen: no tenderness, no masses palpated. No hepatosplenomegaly. Bowel sounds positive.  Musculoskeletal: no clubbing / cyanosis. No joint deformity upper and lower extremities. Good ROM, no contractures. Normal muscle tone.  Skin: no rashes, lesions, ulcers. No induration Neurologic: CN 2-12 grossly intact. Sensation intact, DTR normal. Strength 5/5 in all 4.  Psychiatric: Normal judgment and insight. Alert and oriented x 3.  Normal mood.     Labs on Admission: I have personally reviewed following labs and imaging studies  CBC: Recent Labs  Lab 09/23/20 1240  WBC 6.9  HGB 13.8  HCT 38.7*  MCV 93.9  PLT 153   Basic Metabolic Panel: Recent Labs  Lab 09/23/20 1240  NA 131*  K 3.8  CL 99  CO2 21*  GLUCOSE 300*  BUN 9  CREATININE 0.74  CALCIUM 9.0   GFR: CrCl cannot be calculated (Unknown ideal  weight.). Liver Function Tests: No results for input(s): AST, ALT, ALKPHOS, BILITOT, PROT, ALBUMIN in the last 168 hours. No results for input(s): LIPASE, AMYLASE in the last 168 hours. No results for input(s): AMMONIA in the last 168 hours. Coagulation Profile: No results for input(s): INR, PROTIME in the last 168 hours. Cardiac Enzymes: No results for input(s): CKTOTAL, CKMB, CKMBINDEX, TROPONINI in the last 168 hours. BNP (last 3 results) No results for input(s): PROBNP in the last 8760 hours. HbA1C: No results for input(s): HGBA1C in the last 72 hours. CBG: No results for input(s): GLUCAP in the last 168 hours. Lipid Profile: No results for input(s): CHOL, HDL, LDLCALC, TRIG, CHOLHDL, LDLDIRECT in the last 72 hours. Thyroid Function Tests: No results for input(s): TSH, T4TOTAL, FREET4, T3FREE, THYROIDAB in the last 72 hours. Anemia Panel: No results for input(s): VITAMINB12, FOLATE, FERRITIN, TIBC, IRON, RETICCTPCT in the last 72 hours. Urine analysis:    Component Value Date/Time   COLORURINE YELLOW 09/12/2019 1330   APPEARANCEUR CLEAR 09/12/2019 1330   LABSPEC 1.016 09/12/2019 1330   PHURINE 7.0 09/12/2019 1330   GLUCOSEU >=500 (A) 09/12/2019 1330   HGBUR NEGATIVE 09/12/2019 1330   BILIRUBINUR NEGATIVE 09/12/2019 1330   KETONESUR NEGATIVE 09/12/2019 1330   PROTEINUR NEGATIVE 09/12/2019 1330   NITRITE NEGATIVE 09/12/2019 1330   LEUKOCYTESUR NEGATIVE 09/12/2019 1330    Radiological Exams on Admission: DG Chest 2 View  Result Date: 09/23/2020 CLINICAL DATA:  Fever, chest pain and shortness of breath for 1 week EXAM: CHEST - 2 VIEW COMPARISON:  None FINDINGS: Borderline enlargement of cardiac silhouette. Mediastinal contours normal. Patchy BILATERAL pulmonary infiltrates consistent with multifocal pneumonia. No pleural effusion or pneumothorax. IMPRESSION: Patchy BILATERAL pulmonary infiltrates favor multifocal pneumonia. Electronically Signed   By: Ulyses Southward M.D.   On:  09/23/2020 13:06   CT Angio Chest PE W and/or Wo Contrast  Result Date: 09/23/2020 CLINICAL DATA:  Evaluate for acute pulmonary embolus. Short of breath. EXAM: CT ANGIOGRAPHY CHEST WITH CONTRAST TECHNIQUE: Multidetector CT imaging of the chest was performed using the standard protocol during bolus administration of intravenous contrast. Multiplanar CT image reconstructions and MIPs were obtained to evaluate the vascular anatomy. CONTRAST:  90mL OMNIPAQUE IOHEXOL 350 MG/ML SOLN COMPARISON:  None. FINDINGS: Cardiovascular: The main pulmonary artery is patent. No central obstructing pulmonary embolus. Respiratory motion artifact results in image degradation at the level of the lower lobe segmental pulmonary arteries. Within this limitation there are no signs of lobar or segmental pulmonary artery embolus. Normal heart size. No pericardial effusion. Mediastinum/Nodes: Normal appearance of the thyroid gland. The trachea appears patent and is midline. Normal appearance of the esophagus. No enlarged axillary or supraclavicular lymph nodes. Prominent calcified and noncalcified mediastinal lymph nodes are identified. None of these meet CT criteria for adenopathy. There also prominent bilateral hilar nodes measuring up to 1 cm. Lungs/Pleura: No pleural effusion. Extensive bilateral ground-glass opacities with scattered areas of consolidation within both upper and lower lung zones. Upper Abdomen: No acute findings  within the upper abdomen. Contour of the liver is slightly irregular. Relative hypertrophy of the lateral segment of left hepatic lobe and caudate lobe noted. Musculoskeletal: No chest wall abnormality. No acute or significant osseous findings. Review of the MIP images confirms the above findings. IMPRESSION: 1. Respiratory motion artifact results in image degradation at the level of the lower lobe segmental pulmonary arteries. Within this limitation there are no signs of lobar or segmental pulmonary artery  embolus. 2. Extensive ground-glass opacities with scattered areas of consolidation noted throughout both lungs. Imaging findings concerning for multifocal pneumonia. Atypical viral infection not excluded. 3. Prominent mediastinal and hilar lymph nodes are likely reactive. 4. Morphologic features of the liver suggestive of early cirrhosis. Electronically Signed   By: Signa Kell M.D.   On: 09/23/2020 15:44    EKG: Independently reviewed.  Sinus tachycardia  Assessment/Plan Active Problems:   * No active hospital problems. *  (please populate well all problems here in Problem List. (For example, if patient is on BP meds at home and you resume or decide to hold them, it is a problem that needs to be her. Same for CAD, COPD, HLD and so on)  Acute hypoxic respite failure -Given the fact of multifocal pneumonia bilaterally and positive Covid exposure and disease onset, suspect COVID-19 pneumonia but waiting for Covid labs.  Discussed with patient and family regarding remdesivir steroid and immunomodulator Olumiant. -Breathing treatment and other supportive care -Encourage proning position.  Patient agreed.  New onset diabetes -Lantus 10 units, sliding scale -CT and lipid panel -ACE inhibitor for kidney protection -Discussed with biopsy, outpatient PCP follow-up.  Sinus tachycardia -From Covid infection, treat Covid, start low-dose of metoprolol.  Elevated blood pressure -Probably metabolic syndrome -BP meds as above.  Liver cirrhosis -LFT pending -Suspect EtOH related -No S/S active withdrawal, and last drink was 4 days ago -PRN CIWA.  DVT prophylaxis: Lovenox Code Status: Full code Family Communication: Wife over the phone Disposition Plan: Expect 3 to 4 days hospital stay to treat Covid pneumonia and hypoxia.  Wean down oxygen. Consults called: None Admission status: Telemetry admission.   Emeline General MD [facility] Pager [phone]  09/23/2020, 5:22 PM

## 2020-09-23 NOTE — ED Provider Notes (Signed)
[facility] EMERGENCY DEPARTMENT Provider Note   CSN: [id number] Arrival date & time: 09/23/20  1214     History Chief Complaint  Patient presents with  . Chest Pain  . Shortness of Breath    Ian Pham is a 55 y.o. male.  HPI   Patient is a [place] speaker, Spanish interpreter was used to collect HPI.  Patient with no significant medical history presents to the emergency department with chief complaint of shortness of breath and chest pain.  Patient endorses this has been going on for the last week, he states he had COVID last week and since then he has been having worsening dyspnea on exertion and heart palpitations.  He states when he moves a couple steps he has severe shortness of breath, and he feels like his heart is going to pop out of his chest.  He has associated chest pain, but denies becoming diaphoretic, lightheadedness, dizziness, paresthesias or weakness.  He describes his chest pain is a sharp sensation which she feels in his epigastric region and sometimes rates to his left scapula.  He has no history of PEs or DVTs, currently not on hormone therapy, no cardiac history.  Patient denies diabetes, illicit drug use, does not smoke cigarettes, will occasionally have a beer.  Patient unfortunately is not COVID vaccinated, denies recent sick contacts, denies any alleviating factors.  Patient denies headaches, fevers, chills, abdominal pain, nausea, vomiting, diarrhea, worsening pedal edema.  History reviewed. No pertinent past medical history.  There are no problems to display for this patient.   History reviewed. No pertinent surgical history.     No family history on file.     Home Medications Prior to Admission medications   Medication Sig Start Date End Date Taking? Authorizing Provider  naproxen (NAPROSYN) 500 MG tablet Take 1 tablet (500 mg total) by mouth 2 (two) times daily with a meal. Patient not taking: No sig  reported 09/12/19   Eber Hong, MD    Allergies    Patient has no known allergies.  Review of Systems   Review of Systems  Constitutional: Negative for chills and fever.  HENT: Negative for congestion and sore throat.   Respiratory: Positive for shortness of breath.   Cardiovascular: Positive for chest pain.  Gastrointestinal: Negative for abdominal pain, diarrhea, nausea and vomiting.  Genitourinary: Negative for dysuria and enuresis.  Musculoskeletal: Negative for back pain.  Skin: Negative for rash.  Neurological: Negative for dizziness and headaches.  Hematological: Does not bruise/bleed easily.    Physical Exam Updated Vital Signs BP (!) 147/90   Pulse (!) 103   Temp 99.3 F (37.4 C) (Oral)   Resp (!) 29   SpO2 90%   Physical Exam Vitals and nursing note reviewed.  Constitutional:      General: He is not in acute distress.    Appearance: He is not ill-appearing.  HENT:     Head: Normocephalic and atraumatic.     Nose: No congestion.  Eyes:     Conjunctiva/sclera: Conjunctivae normal.  Cardiovascular:     Rate and Rhythm: Regular rhythm. Tachycardia present.     Pulses: Normal pulses.     Heart sounds: No murmur heard. No friction rub. No gallop.   Pulmonary:     Effort: No respiratory distress.     Breath sounds: Rales present. No wheezing or rhonchi.     Comments: Patient has noted bibasilar Rales worse in the right versus the left, tight sounding  chest without wheezing, rhonchi, no signs of respiratory distress on my exam. Chest:     Chest wall: Tenderness present.  Abdominal:     Palpations: Abdomen is soft.     Tenderness: There is no abdominal tenderness.  Musculoskeletal:     Right lower leg: No edema.     Left lower leg: No edema.     Comments: Patient is moving all 4 extremities at difficulty.  Skin:    General: Skin is warm and dry.  Neurological:     Mental Status: He is alert.  Psychiatric:        Mood and Affect: Mood normal.     ED  Results / Procedures / Treatments   Labs (all labs ordered are listed, but only abnormal results are displayed) Labs Reviewed  BASIC METABOLIC PANEL - Abnormal; Notable for the following components:      Result Value   Sodium 131 (*)    CO2 21 (*)    Glucose, Bld 300 (*)    All other components within normal limits  CBC - Abnormal; Notable for the following components:   RBC 4.12 (*)    HCT 38.7 (*)    All other components within normal limits  RESP PANEL BY RT-PCR (FLU A&B, COVID) [id number]  HEPATIC FUNCTION PANEL  BRAIN NATRIURETIC PEPTIDE  TROPONIN I (HIGH SENSITIVITY)  TROPONIN I (HIGH SENSITIVITY)    EKG None  Radiology DG Chest 2 View  Result Date: 09/23/2020 CLINICAL DATA:  Fever, chest pain and shortness of breath for 1 week EXAM: CHEST - 2 VIEW COMPARISON:  None FINDINGS: Borderline enlargement of cardiac silhouette. Mediastinal contours normal. Patchy BILATERAL pulmonary infiltrates consistent with multifocal pneumonia. No pleural effusion or pneumothorax. IMPRESSION: Patchy BILATERAL pulmonary infiltrates favor multifocal pneumonia. Electronically Signed   By: Ulyses Southward M.D.   On: 09/23/2020 13:06   CT Angio Chest PE W and/or Wo Contrast  Result Date: 09/23/2020 CLINICAL DATA:  Evaluate for acute pulmonary embolus. Short of breath. EXAM: CT ANGIOGRAPHY CHEST WITH CONTRAST TECHNIQUE: Multidetector CT imaging of the chest was performed using the standard protocol during bolus administration of intravenous contrast. Multiplanar CT image reconstructions and MIPs were obtained to evaluate the vascular anatomy. CONTRAST:  26mL OMNIPAQUE IOHEXOL 350 MG/ML SOLN COMPARISON:  None. FINDINGS: Cardiovascular: The main pulmonary artery is patent. No central obstructing pulmonary embolus. Respiratory motion artifact results in image degradation at the level of the lower lobe segmental pulmonary arteries. Within this limitation there are no signs of lobar or segmental pulmonary artery  embolus. Normal heart size. No pericardial effusion. Mediastinum/Nodes: Normal appearance of the thyroid gland. The trachea appears patent and is midline. Normal appearance of the esophagus. No enlarged axillary or supraclavicular lymph nodes. Prominent calcified and noncalcified mediastinal lymph nodes are identified. None of these meet CT criteria for adenopathy. There also prominent bilateral hilar nodes measuring up to 1 cm. Lungs/Pleura: No pleural effusion. Extensive bilateral ground-glass opacities with scattered areas of consolidation within both upper and lower lung zones. Upper Abdomen: No acute findings within the upper abdomen. Contour of the liver is slightly irregular. Relative hypertrophy of the lateral segment of left hepatic lobe and caudate lobe noted. Musculoskeletal: No chest wall abnormality. No acute or significant osseous findings. Review of the MIP images confirms the above findings. IMPRESSION: 1. Respiratory motion artifact results in image degradation at the level of the lower lobe segmental pulmonary arteries. Within this limitation there are no signs of lobar or segmental pulmonary artery embolus.  2. Extensive ground-glass opacities with scattered areas of consolidation noted throughout both lungs. Imaging findings concerning for multifocal pneumonia. Atypical viral infection not excluded. 3. Prominent mediastinal and hilar lymph nodes are likely reactive. 4. Morphologic features of the liver suggestive of early cirrhosis. Electronically Signed   By: Signa Kell M.D.   On: 09/23/2020 15:44    Procedures Procedures   Medications Ordered in ED Medications  iohexol (OMNIPAQUE) 350 MG/ML injection 60 mL (60 mLs Intravenous Contrast Given 09/23/20 1526)    ED Course  I have reviewed the triage vital signs and the nursing notes.  Pertinent labs & imaging results that were available during my care of the patient were reviewed by me and considered in my medical decision making  (see chart for details).    MDM Rules/Calculators/A&P                          Initial impression-patient presents with dyspnea on exertion, with associated chest pain.  He is alert, does not appear in acute distress, vital signs notable for tachycardia and tachypnea.  Concern for ACS versus PE versus pneumonia.  Will obtain basic lab work, chest x-ray, troponins, send down for a PE study.  Work-up-CBC unremarkable, BMP shows hyponatremia 131, slight decrease in CO2 of 21, hyperglycemia 300, no anion gap present.  First troponin is 7.  Chest x-ray shows bilateral pulmonary infiltrates favor multifocal pneumonia.  EKG sinus tach without signs of ischemia nonspecific T wave inversions noted in lead III.  Rule out-I have low suspicion for ACS as history is atypical, patient has no cardiac history, EKG sinus tach without signs of ischemia, initial troponin is 12, second troponin is pending at this time.  I have low suspicion for PE but I cannot exclude this as he has increased risk with recent Covid.  I suspect tachycardia and tachypnea is secondary due to post Covid infection.  Low suspicion for AAA as history is atypical, patient has low risk factors.   Plan- Due to shift change patient handed off to [facility], PA-C, she provided HPI, current work-up, likely disposition I suspect patient suffering from post Covid infection, recommend following up with imaging and ambulate patient.  If imaging and ambulation is reassuring patient can be discharged home with or without antibiotics and close follow-up with PCP.    Final Clinical Impression(s) / ED Diagnoses Final diagnoses:  Dyspnea, unspecified type  Atypical chest pain    Rx / DC Orders ED Discharge Orders    None       Carroll Sage, PA-C 09/23/20 1606    Benjiman Core, MD 09/25/20 [phone]

## 2020-09-24 DIAGNOSIS — J189 Pneumonia, unspecified organism: Secondary | ICD-10-CM

## 2020-09-24 LAB — CBC WITH DIFFERENTIAL/PLATELET
Abs Immature Granulocytes: 0.01 10*3/uL (ref 0.00–0.07)
Basophils Absolute: 0 10*3/uL (ref 0.0–0.1)
Basophils Relative: 0 %
Eosinophils Absolute: 0 10*3/uL (ref 0.0–0.5)
Eosinophils Relative: 0 %
HCT: 39.6 % (ref 39.0–52.0)
Hemoglobin: 13.2 g/dL (ref 13.0–17.0)
Immature Granulocytes: 0 %
Lymphocytes Relative: 23 %
Lymphs Abs: 1.1 10*3/uL (ref 0.7–4.0)
MCH: 32.4 pg (ref 26.0–34.0)
MCHC: 33.3 g/dL (ref 30.0–36.0)
MCV: 97.3 fL (ref 80.0–100.0)
Monocytes Absolute: 0.1 10*3/uL (ref 0.1–1.0)
Monocytes Relative: 2 %
Neutro Abs: 3.4 10*3/uL (ref 1.7–7.7)
Neutrophils Relative %: 75 %
Platelets: 124 10*3/uL — ABNORMAL LOW (ref 150–400)
RBC: 4.07 MIL/uL — ABNORMAL LOW (ref 4.22–5.81)
RDW: 13.2 % (ref 11.5–15.5)
WBC: 4.6 10*3/uL (ref 4.0–10.5)
nRBC: 0 % (ref 0.0–0.2)

## 2020-09-24 LAB — COMPREHENSIVE METABOLIC PANEL
ALT: 25 U/L (ref 0–44)
AST: 42 U/L — ABNORMAL HIGH (ref 15–41)
Albumin: 2.4 g/dL — ABNORMAL LOW (ref 3.5–5.0)
Alkaline Phosphatase: 153 U/L — ABNORMAL HIGH (ref 38–126)
Anion gap: 8 (ref 5–15)
BUN: 13 mg/dL (ref 6–20)
CO2: 19 mmol/L — ABNORMAL LOW (ref 22–32)
Calcium: 9 mg/dL (ref 8.9–10.3)
Chloride: 103 mmol/L (ref 98–111)
Creatinine, Ser: 0.71 mg/dL (ref 0.61–1.24)
GFR, Estimated: >60 mL/min (ref >60)
Glucose, Bld: 276 mg/dL — ABNORMAL HIGH (ref 70–99)
Potassium: 4.3 mmol/L (ref 3.5–5.1)
Sodium: 130 mmol/L — ABNORMAL LOW (ref 135–145)
Total Bilirubin: 1.4 mg/dL — ABNORMAL HIGH (ref 0.3–1.2)
Total Protein: 8 g/dL (ref 6.5–8.1)

## 2020-09-24 LAB — GLUCOSE, CAPILLARY: Glucose-Capillary: 262 mg/dL — ABNORMAL HIGH (ref 70–99)

## 2020-09-24 LAB — PHOSPHORUS: Phosphorus: 2.7 mg/dL (ref 2.5–4.6)

## 2020-09-24 LAB — CBG MONITORING, ED
Glucose-Capillary: 264 mg/dL — ABNORMAL HIGH (ref 70–99)
Glucose-Capillary: 282 mg/dL — ABNORMAL HIGH (ref 70–99)
Glucose-Capillary: 261 mg/dL — ABNORMAL HIGH (ref 70–99)

## 2020-09-24 LAB — POC SARS CORONAVIRUS 2 AG -  ED: SARS Coronavirus 2 Ag: NEGATIVE

## 2020-09-24 LAB — MAGNESIUM: Magnesium: 1.7 mg/dL (ref 1.7–2.4)

## 2020-09-24 LAB — FERRITIN: Ferritin: 267 ng/mL (ref 24–336)

## 2020-09-24 LAB — C-REACTIVE PROTEIN: CRP: 0.6 mg/dL (ref <1.0)

## 2020-09-24 LAB — D-DIMER, QUANTITATIVE: D-Dimer, Quant: 0.51 ug/mL-FEU — ABNORMAL HIGH (ref 0.00–0.50)

## 2020-09-24 MED ORDER — SODIUM CHLORIDE 0.9 % IV SOLN
500.0000 mg | INTRAVENOUS | Status: DC
  Administered 2020-09-24 – 2020-09-27 (×4): 500 mg via INTRAVENOUS
  Filled 2020-09-24 (×4): qty 500

## 2020-09-24 MED ORDER — CEFTRIAXONE SODIUM 1 G IJ SOLR
1.0000 g | INTRAMUSCULAR | Status: DC
  Administered 2020-09-24 – 2020-09-25 (×2): 1 g via INTRAVENOUS
  Filled 2020-09-24 (×2): qty 10

## 2020-09-24 NOTE — ED Notes (Signed)
Lunch Tray Ordered @ 1004. 

## 2020-09-24 NOTE — ED Notes (Signed)
Dinner Tray Ordered @ 1721. 

## 2020-09-24 NOTE — ED Notes (Signed)
Admitting MD at bedside.

## 2020-09-24 NOTE — ED Notes (Signed)
Pt ambulated to bathroom on his own power, gait even and steady. Pt's bed cleaned and clean linens provided. Pt back in bed resting.

## 2020-09-24 NOTE — Progress Notes (Signed)
Pt arrived to 5W  rm 36. Pt hooked to monitor. Skin assessed w/ Denny Peon, RN. Skin it intact w/ dry, flaky skin on bilat feet. Pt's belongings in room w/ pt

## 2020-09-24 NOTE — ED Notes (Signed)
Pt received breakfast. No needs from pt at this time.

## 2020-09-24 NOTE — ED Notes (Signed)
Tele Breakfast order placed 

## 2020-09-24 NOTE — Progress Notes (Signed)
PROGRESS NOTE    Akash Winski  YNW:[id number] DOB: [birth year] DOA: 09/23/2020 PCP: Patient, No Pcp Per   Brief Narrative:  Royalty Domagala is a 55 y.o. male with no significant medical history scented with new onset of cough and shortness of breath.  Patient has a 54-year-old son tested positive for Covid 3 weeks ago.  Patient indicates his symptoms started approximately 4 or 5 weeks ago and has been getting worse over the past few weeks more acutely over the past 3 days.  Admits to dyspnea with exertion more profound over the past few days but denies nausea vomiting diarrhea constipation headache fevers or chills.  In the ED patient was noted to be minimally hypoxic, exacerbated with exertion dropping into the low 80s on room air.  Chest x-ray shows multifocal pneumonia CTA shows multifocal disease as well but negative for any overt filling defects.  Patient's COVID-19 swab initially was negative, thought to be false negative patient was swabbed again this morning with same result.  At this point given patient's duration of illness and family history of Covid infection it is likely that the patient initially had Covid had given it to the family and is now resolved from Covid but is having post Covid bacterial pneumonia.  X-ray and CT findings are likely in the setting of profound untreated infiltrate from previous Covid infection, but given duration acute treatment with Remdesivir is not indicated and baricitinib is contraindicated given concern for bacterial infection.  Assessment & Plan:   Active Problems:   PNA (pneumonia)   COVID-19  Sepsis secondary to acute hypoxic respiratory failure, concern for multifocal bacterial pneumonia, POA COVID-19 pneumonia ruled out -Patient admitted with tachycardia, tachypnea and presumed pneumonia given imaging -Continue Zithromax and ceftriaxone for likely post viral bacterial pneumonia/CAP -Patient Covid negative x2 this  morning -despite having positive family at home patient remains negative, likely had Covid weeks ago given prolonged illness and has at this point resolved from his acute illness. -Given negative Covid discontinue Remdesivir and baricitinib -Continue supportive care, wean oxygen as tolerated; continue incentive spirometry, flutter as tolerated.  Hyperglycemia, new onset diabetes Lab Results  Component Value Date   HGBA1C 7.2 (H) 09/23/2020  Continue sliding scale insulin, Lantus 10 units nightly, hypoglycemic protocol  Elevated blood pressure, unclear if reactive secondary to above versus chronic undiagnosed hypertension Continue to follow blood pressure, continue lisinopril 5, metoprolol 25  DVT prophylaxis: Lovenox Code Status: Full Family Communication: None present  Status is: Inpatient  Dispo: The patient is from: Home              Anticipated d/c is to: Home              Anticipated d/c date is: >72h              Patient currently not medically stable for discharge  Consultants:   None  Procedures:   None  Antimicrobials:  Ceftriaxone, azithromycin through 09/28/2020  Subjective: Continues to complain of dyspnea with minimal exertion, denies chest pain, headache, fever, chills, nausea, vomiting, diarrhea, constipation.  Objective: Vitals:   09/23/20 2335 09/24/20 0200 09/24/20 0500 09/24/20 0600  BP: (!) 131/93 120/78 109/90 (!) 118/93  Pulse: (!) 109 95 100 100  Resp: (!) 32 (!) 23 20 (!) 25  Temp:      TempSrc:      SpO2: 95% 92% 90% 94%  Weight:        Intake/Output Summary (Last 24 hours) at  09/24/2020 0803 Last data filed at 09/24/2020 0640 Gross per 24 hour  Intake 400 ml  Output --  Net 400 ml   Filed Weights   09/23/20 1715  Weight: 129.3 kg    Examination:  General:  Pleasantly resting in bed, No acute distress. HEENT:  Normocephalic atraumatic.  Sclerae nonicteric, noninjected.  Extraocular movements intact bilaterally. Neck:  Without mass  or deformity.  Trachea is midline. Lungs: Coarse breath sounds bilaterally with out overt wheeze or rales. Heart:  Regular rate and rhythm.  Without murmurs, rubs, or gallops. Abdomen:  Soft, nontender, nondistended.  Without guarding or rebound. Extremities: Without cyanosis, clubbing, edema, or obvious deformity. Vascular:  Dorsalis pedis and posterior tibial pulses palpable bilaterally. Skin:  Warm and dry, no erythema, no ulcerations.  Data Reviewed: I have personally reviewed following labs and imaging studies  CBC: Recent Labs  Lab 09/23/20 1240 09/24/20 0335  WBC 6.9 4.6  NEUTROABS  --  3.4  HGB 13.8 13.2  HCT 38.7* 39.6  MCV 93.9 97.3  PLT 153 124*   Basic Metabolic Panel: Recent Labs  Lab 09/23/20 1240 09/24/20 0335  NA 131* 130*  K 3.8 4.3  CL 99 103  CO2 21* 19*  GLUCOSE 300* 276*  BUN 9 13  CREATININE 0.74 0.71  CALCIUM 9.0 9.0  MG  --  1.7  PHOS  --  2.7   GFR: CrCl cannot be calculated (Unknown ideal weight.).  Liver Function Tests: Recent Labs  Lab 09/23/20 1525 09/24/20 0335  AST 54* 42*  ALT 26 25  ALKPHOS 176* 153*  BILITOT 1.4* 1.4*  PROT 8.1 8.0  ALBUMIN 2.5* 2.4*   No results for input(s): LIPASE, AMYLASE in the last 168 hours. No results for input(s): AMMONIA in the last 168 hours.  Coagulation Profile: No results for input(s): INR, PROTIME in the last 168 hours.  Cardiac Enzymes: No results for input(s): CKTOTAL, CKMB, CKMBINDEX, TROPONINI in the last 168 hours.  BNP (last 3 results) No results for input(s): PROBNP in the last 8760 hours.  HbA1C: Recent Labs    09/23/20 1729  HGBA1C 7.2*   CBG: Recent Labs  Lab 09/23/20 2227 09/24/20 0729  GLUCAP 235* 264*   Lipid Profile: Recent Labs    09/23/20 1627 09/23/20 2000  CHOL  --  160  HDL  --  50  LDLCALC  --  88  TRIG 125 111  CHOLHDL  --  3.2   Thyroid Function Tests: No results for input(s): TSH, T4TOTAL, FREET4, T3FREE, THYROIDAB in the last 72  hours. Anemia Panel: Recent Labs    09/23/20 1627 09/24/20 0335  FERRITIN 280 267   Sepsis Labs: Recent Labs  Lab 09/23/20 1627 09/23/20 1745 09/23/20 2029  PROCALCITON  --  <0.10  --   LATICACIDVEN 1.3  --  1.2    Recent Results (from the past 240 hour(s))  Resp Panel by RT-PCR (Flu A&B, Covid) Nasopharyngeal Swab     Status: None   Collection Time: 09/23/20  8:14 PM   Specimen: Nasopharyngeal Swab; Nasopharyngeal(NP) swabs in vial transport medium  Result Value Ref Range Status   SARS Coronavirus 2 by RT PCR NEGATIVE NEGATIVE Final    Comment: (NOTE) SARS-CoV-2 target nucleic acids are NOT DETECTED.  The SARS-CoV-2 RNA is generally detectable in upper respiratory specimens during the acute phase of infection. The lowest concentration of SARS-CoV-2 viral copies this assay can detect is 138 copies/mL. A negative result does not preclude SARS-Cov-2 infection and should  not be used as the sole basis for treatment or other patient management decisions. A negative result may occur with  improper specimen collection/handling, submission of specimen other than nasopharyngeal swab, presence of viral mutation(s) within the areas targeted by this assay, and inadequate number of viral copies(<138 copies/mL). A negative result must be combined with clinical observations, patient history, and epidemiological information. The expected result is Negative.  Fact Sheet for Patients:  BloggerCourse.com  Fact Sheet for Healthcare Providers:  SeriousBroker.it  This test is no t yet approved or cleared by the [place] FDA and  has been authorized for detection and/or diagnosis of SARS-CoV-2 by FDA under an Emergency Use Authorization (EUA). This EUA will remain  in effect (meaning this test can be used) for the duration of the COVID-19 declaration under Section 564(b)(1) of the Act, 21 U.S.C.section 360bbb-3(b)(1), unless the  authorization is terminated  or revoked sooner.       Influenza A by PCR NEGATIVE NEGATIVE Final   Influenza B by PCR NEGATIVE NEGATIVE Final    Comment: (NOTE) The Xpert Xpress SARS-CoV-2/FLU/RSV plus assay is intended as an aid in the diagnosis of influenza from Nasopharyngeal swab specimens and should not be used as a sole basis for treatment. Nasal washings and aspirates are unacceptable for Xpert Xpress SARS-CoV-2/FLU/RSV testing.  Fact Sheet for Patients: BloggerCourse.com  Fact Sheet for Healthcare Providers: SeriousBroker.it  This test is not yet approved or cleared by the [place] FDA and has been authorized for detection and/or diagnosis of SARS-CoV-2 by FDA under an Emergency Use Authorization (EUA). This EUA will remain in effect (meaning this test can be used) for the duration of the COVID-19 declaration under Section 564(b)(1) of the Act, 21 U.S.C. section 360bbb-3(b)(1), unless the authorization is terminated or revoked.  Performed at [facility], [address] [place], [place] [address]      Radiology Studies: DG Chest 2 View  Result Date: 09/23/2020 CLINICAL DATA:  Fever, chest pain and shortness of breath for 1 week EXAM: CHEST - 2 VIEW COMPARISON:  None FINDINGS: Borderline enlargement of cardiac silhouette. Mediastinal contours normal. Patchy BILATERAL pulmonary infiltrates consistent with multifocal pneumonia. No pleural effusion or pneumothorax. IMPRESSION: Patchy BILATERAL pulmonary infiltrates favor multifocal pneumonia. Electronically Signed   By: Ulyses Southward M.D.   On: 09/23/2020 13:06   CT Angio Chest PE W and/or Wo Contrast  Result Date: 09/23/2020 CLINICAL DATA:  Evaluate for acute pulmonary embolus. Short of breath. EXAM: CT ANGIOGRAPHY CHEST WITH CONTRAST TECHNIQUE: Multidetector CT imaging of the chest was performed using the standard protocol during bolus administration of  intravenous contrast. Multiplanar CT image reconstructions and MIPs were obtained to evaluate the vascular anatomy. CONTRAST:  26mL OMNIPAQUE IOHEXOL 350 MG/ML SOLN COMPARISON:  None. FINDINGS: Cardiovascular: The main pulmonary artery is patent. No central obstructing pulmonary embolus. Respiratory motion artifact results in image degradation at the level of the lower lobe segmental pulmonary arteries. Within this limitation there are no signs of lobar or segmental pulmonary artery embolus. Normal heart size. No pericardial effusion. Mediastinum/Nodes: Normal appearance of the thyroid gland. The trachea appears patent and is midline. Normal appearance of the esophagus. No enlarged axillary or supraclavicular lymph nodes. Prominent calcified and noncalcified mediastinal lymph nodes are identified. None of these meet CT criteria for adenopathy. There also prominent bilateral hilar nodes measuring up to 1 cm. Lungs/Pleura: No pleural effusion. Extensive bilateral ground-glass opacities with scattered areas of consolidation within both upper and lower lung zones. Upper  Abdomen: No acute findings within the upper abdomen. Contour of the liver is slightly irregular. Relative hypertrophy of the lateral segment of left hepatic lobe and caudate lobe noted. Musculoskeletal: No chest wall abnormality. No acute or significant osseous findings. Review of the MIP images confirms the above findings. IMPRESSION: 1. Respiratory motion artifact results in image degradation at the level of the lower lobe segmental pulmonary arteries. Within this limitation there are no signs of lobar or segmental pulmonary artery embolus. 2. Extensive ground-glass opacities with scattered areas of consolidation noted throughout both lungs. Imaging findings concerning for multifocal pneumonia. Atypical viral infection not excluded. 3. Prominent mediastinal and hilar lymph nodes are likely reactive. 4. Morphologic features of the liver suggestive of  early cirrhosis. Electronically Signed   By: Signa Kell M.D.   On: 09/23/2020 15:44   Scheduled Meds: . baricitinib  4 mg Oral Daily  . enoxaparin (LOVENOX) injection  40 mg Subcutaneous QHS  . folic acid  1 mg Oral Daily  . insulin aspart  0-15 Units Subcutaneous TID WC  . insulin aspart  0-5 Units Subcutaneous QHS  . insulin glargine  10 Units Subcutaneous QHS  . Ipratropium-Albuterol  1 puff Inhalation Q6H  . lidocaine  1 patch Transdermal Q24H  . lisinopril  5 mg Oral Daily  . metoprolol tartrate  25 mg Oral BID  . multivitamin with minerals  1 tablet Oral Daily  . [START ON 09/26/2020] predniSONE  50 mg Oral Daily  . thiamine  100 mg Oral Daily   Or  . thiamine  100 mg Intravenous Daily   Continuous Infusions: . sodium chloride 1,000 mL (09/23/20 2008)  . methylPREDNISolone (SOLU-MEDROL) injection Stopped (09/24/20 0640)  . remdesivir 100 mg in NS 100 mL       LOS: 1 day   Time spent: 40 min  Azucena Fallen, DO [facility]  If 7PM-7AM, please contact night-coverage [email or web address]  09/24/2020, 8:03 AM

## 2020-09-24 NOTE — ED Notes (Signed)
Report called on ready bed, was told that there is not a bed in the room. Receiving nurse will call this writer back when bed/room is ready.

## 2020-09-25 LAB — CBC WITH DIFFERENTIAL/PLATELET
Abs Immature Granulocytes: 0.06 10*3/uL (ref 0.00–0.07)
Basophils Absolute: 0 10*3/uL (ref 0.0–0.1)
Basophils Relative: 0 %
Eosinophils Absolute: 0 10*3/uL (ref 0.0–0.5)
Eosinophils Relative: 0 %
HCT: 34.4 % — ABNORMAL LOW (ref 39.0–52.0)
Hemoglobin: 12.3 g/dL — ABNORMAL LOW (ref 13.0–17.0)
Immature Granulocytes: 1 %
Lymphocytes Relative: 10 %
Lymphs Abs: 1.1 10*3/uL (ref 0.7–4.0)
MCH: 33.4 pg (ref 26.0–34.0)
MCHC: 35.8 g/dL (ref 30.0–36.0)
MCV: 93.5 fL (ref 80.0–100.0)
Monocytes Absolute: 0.4 10*3/uL (ref 0.1–1.0)
Monocytes Relative: 4 %
Neutro Abs: 9.2 10*3/uL — ABNORMAL HIGH (ref 1.7–7.7)
Neutrophils Relative %: 85 %
Platelets: 122 10*3/uL — ABNORMAL LOW (ref 150–400)
RBC: 3.68 MIL/uL — ABNORMAL LOW (ref 4.22–5.81)
RDW: 13.2 % (ref 11.5–15.5)
WBC: 10.8 10*3/uL — ABNORMAL HIGH (ref 4.0–10.5)
nRBC: 0 % (ref 0.0–0.2)

## 2020-09-25 LAB — RESPIRATORY PANEL BY PCR

## 2020-09-25 LAB — COMPREHENSIVE METABOLIC PANEL
ALT: 21 U/L (ref 0–44)
AST: 29 U/L (ref 15–41)
Albumin: 2.2 g/dL — ABNORMAL LOW (ref 3.5–5.0)
Alkaline Phosphatase: 129 U/L — ABNORMAL HIGH (ref 38–126)
Anion gap: 7 (ref 5–15)
BUN: 16 mg/dL (ref 6–20)
CO2: 19 mmol/L — ABNORMAL LOW (ref 22–32)
Calcium: 8.7 mg/dL — ABNORMAL LOW (ref 8.9–10.3)
Chloride: 107 mmol/L (ref 98–111)
Creatinine, Ser: 0.7 mg/dL (ref 0.61–1.24)
GFR, Estimated: >60 mL/min (ref >60)
Glucose, Bld: 248 mg/dL — ABNORMAL HIGH (ref 70–99)
Potassium: 4.2 mmol/L (ref 3.5–5.1)
Sodium: 133 mmol/L — ABNORMAL LOW (ref 135–145)
Total Bilirubin: 1.1 mg/dL (ref 0.3–1.2)
Total Protein: 7.5 g/dL (ref 6.5–8.1)

## 2020-09-25 LAB — GLUCOSE, CAPILLARY
Glucose-Capillary: 191 mg/dL — ABNORMAL HIGH (ref 70–99)
Glucose-Capillary: 326 mg/dL — ABNORMAL HIGH (ref 70–99)
Glucose-Capillary: 323 mg/dL — ABNORMAL HIGH (ref 70–99)
Glucose-Capillary: 382 mg/dL — ABNORMAL HIGH (ref 70–99)

## 2020-09-25 LAB — HEPATITIS B SURFACE ANTIGEN: Hepatitis B Surface Ag: NONREACTIVE

## 2020-09-25 LAB — PHOSPHORUS: Phosphorus: 3.5 mg/dL (ref 2.5–4.6)

## 2020-09-25 LAB — HEPATITIS C ANTIBODY: HCV Ab: NONREACTIVE

## 2020-09-25 LAB — C-REACTIVE PROTEIN: CRP: <0.5 mg/dL (ref <1.0)

## 2020-09-25 LAB — D-DIMER, QUANTITATIVE: D-Dimer, Quant: 0.42 ug/mL-FEU (ref 0.00–0.50)

## 2020-09-25 LAB — MAGNESIUM: Magnesium: 1.9 mg/dL (ref 1.7–2.4)

## 2020-09-25 LAB — FERRITIN: Ferritin: 239 ng/mL (ref 24–336)

## 2020-09-25 MED ORDER — ENOXAPARIN SODIUM 60 MG/0.6ML ~~LOC~~ SOLN
60.0000 mg | SUBCUTANEOUS | Status: DC
  Administered 2020-09-25 – 2020-09-27 (×3): 60 mg via SUBCUTANEOUS
  Filled 2020-09-25 (×3): qty 0.6

## 2020-09-25 MED ORDER — FUROSEMIDE 10 MG/ML IJ SOLN
20.0000 mg | Freq: Once | INTRAMUSCULAR | Status: AC
  Administered 2020-09-25: 20 mg via INTRAVENOUS
  Filled 2020-09-25: qty 2

## 2020-09-25 MED ORDER — LIVING WELL WITH DIABETES BOOK - IN SPANISH
Freq: Once | Status: AC
  Filled 2020-09-25: qty 1

## 2020-09-25 MED ORDER — SODIUM CHLORIDE 0.9 % IV SOLN: 1000.0000 mL | INTRAVENOUS | Status: DC

## 2020-09-25 MED ORDER — INSULIN ASPART 100 UNIT/ML ~~LOC~~ SOLN
4.0000 [IU] | Freq: Three times a day (TID) | SUBCUTANEOUS | Status: DC
  Administered 2020-09-25 – 2020-09-28 (×8): 4 [IU] via SUBCUTANEOUS

## 2020-09-25 MED ORDER — INSULIN GLARGINE 100 UNIT/ML ~~LOC~~ SOLN
15.0000 [IU] | Freq: Every day | SUBCUTANEOUS | Status: DC
  Administered 2020-09-25 – 2020-09-27 (×3): 15 [IU] via SUBCUTANEOUS
  Filled 2020-09-25 (×4): qty 0.15

## 2020-09-25 MED ORDER — CEFTRIAXONE SODIUM 2 G IJ SOLR
2.0000 g | INTRAMUSCULAR | Status: DC
  Administered 2020-09-26 – 2020-09-27 (×2): 2 g via INTRAVENOUS
  Filled 2020-09-25 (×2): qty 20

## 2020-09-25 NOTE — Plan of Care (Signed)

## 2020-09-25 NOTE — Progress Notes (Addendum)
PROGRESS NOTE        PATIENT DETAILS Name: Ian Pham Age: 55 y.o. Sex: male Date of Birth: September 06, 1965 Admit Date: 09/23/2020 Admitting Physician Emeline General, MD ZOX:[id number], No Pcp Per  Brief Narrative: Patient is a 55 y.o. male no past medical history-claims he had COVID-19 infection approximately 4 weeks before this hospital stay (numerous family members with similar symptoms) presented to the hospital with 2-week history of exertional dyspnea.  Significant events: 2/28>> admit with SOB-multifocal pneumonia on chest x-ray  Significant studies: 2/28>>CTA Chest: no PE, extensive ground glass opacities  Antimicrobial therapy: None  Microbiology data: 2/28>> microbiology: No growth 2/28>>Influenza/Covid PCR:neg 3/1>>Covid PCR:neg 3/2>>Resp virus panel:pending  Procedures : None  Consults: None  DVT Prophylaxis : Prophylactic Lovenox  Subjective: Continues to have exertional dyspnea-stable at rest.  On 5 L of oxygen.  (Used Science writer at bedside)  Assessment/Plan: Acute hypoxic respiratory failure due to multifocal pneumonia: Continue Rocephin/Zithromax-attempt to titrate down oxygen further.  No signs of volume overload-we will give IV Lasix x1 today.  Check respiratory virus panel.  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  HTN: Controlled-continue Lopressor/lisinopril  Newly diagnosed DM-2 (A1c 7.2 on 2/28) with uncontrolled hyperglycemia due to steroids: CBGs uncontrolled-increase Lantus to 15 units daily, add 4 units of NovoLog with meals-follow and adjust.   Recent Labs    09/24/20 2008 09/25/20 0715 09/25/20 1203  GLUCAP 262* 191* 326*   Liver Cirrhoses: seen on CTA chest-check hepatitis B and C serology.?  EtOH related  Morbid Obesity: Estimated body mass index is 55.66 kg/m as calculated from the following:   Height as of this encounter: 5' (1.524 m).   Weight  as of this encounter: 129.3 kg.   Diet: Diet Order            Diet Carb Modified Fluid consistency: Thin; Room service appropriate? No  Diet effective now                  Code Status: Full code   Family Communication:   Disposition Plan: Status is: Inpatient  Remains inpatient appropriate because:Inpatient level of care appropriate due to severity of illness   Dispo: The patient is from: Home              Anticipated d/c is to: Home              Patient currently is not medically stable to d/c.   Difficult to place patient No    Barriers to Discharge: Pneumonia-with hypoxia-on supplemental O2  Antimicrobial agents: Anti-infectives (From admission, onward)   Start     Dose/Rate Route Frequency Ordered Stop   09/26/20 1115  cefTRIAXone (ROCEPHIN) 2 g in sodium chloride 0.9 % 100 mL IVPB        2 g 200 mL/hr over 30 Minutes Intravenous Every 24 hours 09/25/20 1305 09/29/20 1114   09/24/20 1115  azithromycin (ZITHROMAX) 500 mg in sodium chloride 0.9 % 250 mL IVPB        500 mg 250 mL/hr over 60 Minutes Intravenous Every 24 hours 09/24/20 1100 09/29/20 1114   09/24/20 1115  cefTRIAXone (ROCEPHIN) 1 g in sodium chloride 0.9 % 100 mL IVPB  Status:  Discontinued        1 g 200 mL/hr over 30 Minutes Intravenous Every 24 hours 09/24/20 1100 09/25/20  1305   09/24/20 1000  remdesivir 100 mg in sodium chloride 0.9 % 100 mL IVPB  Status:  Discontinued       "Followed by" Linked Group Details   100 mg 200 mL/hr over 30 Minutes Intravenous Daily 09/23/20 1728 09/24/20 1100   09/23/20 1830  remdesivir 200 mg in sodium chloride 0.9% 250 mL IVPB       "Followed by" Linked Group Details   200 mg 580 mL/hr over 30 Minutes Intravenous Once 09/23/20 1728 09/23/20 2045       Time spent: 25 minutes-Greater than 50% of this time was spent in counseling, explanation of diagnosis, planning of further management, and coordination of care.  MEDICATIONS: Scheduled Meds: . enoxaparin  (LOVENOX) injection  60 mg Subcutaneous Q24H  . folic acid  1 mg Oral Daily  . insulin aspart  0-15 Units Subcutaneous TID WC  . insulin aspart  0-5 Units Subcutaneous QHS  . insulin glargine  10 Units Subcutaneous QHS  . Ipratropium-Albuterol  1 puff Inhalation Q6H  . lidocaine  1 patch Transdermal Q24H  . lisinopril  5 mg Oral Daily  . metoprolol tartrate  25 mg Oral BID  . multivitamin with minerals  1 tablet Oral Daily  . [START ON 09/26/2020] predniSONE  50 mg Oral Daily  . thiamine  100 mg Oral Daily   Or  . thiamine  100 mg Intravenous Daily   Continuous Infusions: . sodium chloride 10 mL/hr at 09/25/20 1241  . azithromycin 500 mg (09/25/20 1137)  . [START ON 09/26/2020] cefTRIAXone (ROCEPHIN)  IV    . methylPREDNISolone (SOLU-MEDROL) injection 130 mg (09/25/20 0831)   PRN Meds:.acetaminophen, guaiFENesin-dextromethorphan, LORazepam **OR** LORazepam   PHYSICAL EXAM: Vital signs: Vitals:   09/24/20 2002 09/24/20 2334 09/25/20 0413 09/25/20 1200  BP: 116/74 115/64 (!) 97/50   Pulse: 96 79 65   Resp: (!) 23 20 20    Temp: 97.9 F (36.6 C) 97.8 F (36.6 C) 97.9 F (36.6 C)   TempSrc: Axillary Axillary Axillary   SpO2: 92% 94% 95%   Weight:      Height:    5' (1.524 m)   Filed Weights   09/23/20 1715  Weight: 129.3 kg   Body mass index is 55.66 kg/m.   Gen Exam:Alert awake-not in any distress HEENT:atraumatic, normocephalic Chest: B/L clear to auscultation anteriorly CVS:S1S2 regular Abdomen:soft non tender, non distended Extremities:no edema Neurology: Non focal Skin: no rash  I have personally reviewed following labs and imaging studies  LABORATORY DATA: CBC: Recent Labs  Lab 09/23/20 1240 09/24/20 0335 09/25/20 0146  WBC 6.9 4.6 10.8*  NEUTROABS  --  3.4 9.2*  HGB 13.8 13.2 12.3*  HCT 38.7* 39.6 34.4*  MCV 93.9 97.3 93.5  PLT 153 124* 122*    Basic Metabolic Panel: Recent Labs  Lab 09/23/20 1240 09/24/20 0335 09/25/20 0146  NA 131* 130*  133*  K 3.8 4.3 4.2  CL 99 103 107  CO2 21* 19* 19*  GLUCOSE 300* 276* 248*  BUN 9 13 16   CREATININE 0.74 0.71 0.70  CALCIUM 9.0 9.0 8.7*  MG  --  1.7 1.9  PHOS  --  2.7 3.5    GFR: Estimated Creatinine Clearance: 120.6 mL/min (by C-G formula based on SCr of 0.7 mg/dL).  Liver Function Tests: Recent Labs  Lab 09/23/20 1525 09/24/20 0335 09/25/20 0146  AST 54* 42* 29  ALT 26 25 21   ALKPHOS 176* 153* 129*  BILITOT 1.4* 1.4* 1.1  PROT 8.1  8.0 7.5  ALBUMIN 2.5* 2.4* 2.2*   No results for input(s): LIPASE, AMYLASE in the last 168 hours. No results for input(s): AMMONIA in the last 168 hours.  Coagulation Profile: No results for input(s): INR, PROTIME in the last 168 hours.  Cardiac Enzymes: No results for input(s): CKTOTAL, CKMB, CKMBINDEX, TROPONINI in the last 168 hours.  BNP (last 3 results) No results for input(s): PROBNP in the last 8760 hours.  Lipid Profile: Recent Labs    09/23/20 1627 09/23/20 2000  CHOL  --  160  HDL  --  50  LDLCALC  --  88  TRIG 125 111  CHOLHDL  --  3.2    Thyroid Function Tests: No results for input(s): TSH, T4TOTAL, FREET4, T3FREE, THYROIDAB in the last 72 hours.  Anemia Panel: Recent Labs    09/24/20 0335 09/25/20 0146  FERRITIN 267 239    Urine analysis:    Component Value Date/Time   COLORURINE YELLOW 09/12/2019 1330   APPEARANCEUR CLEAR 09/12/2019 1330   LABSPEC 1.016 09/12/2019 1330   PHURINE 7.0 09/12/2019 1330   GLUCOSEU >=500 (A) 09/12/2019 1330   HGBUR NEGATIVE 09/12/2019 1330   BILIRUBINUR NEGATIVE 09/12/2019 1330   KETONESUR NEGATIVE 09/12/2019 1330   PROTEINUR NEGATIVE 09/12/2019 1330   NITRITE NEGATIVE 09/12/2019 1330   LEUKOCYTESUR NEGATIVE 09/12/2019 1330    Sepsis Labs: Lactic Acid, Venous    Component Value Date/Time   LATICACIDVEN 1.2 09/23/2020 2029    MICROBIOLOGY: Recent Results (from the past 240 hour(s))  Blood Culture (routine x 2)     Status: None (Preliminary result)    Collection Time: 09/23/20  4:27 PM   Specimen: BLOOD LEFT HAND  Result Value Ref Range Status   Specimen Description BLOOD LEFT HAND  Final   Special Requests   Final    BOTTLES DRAWN AEROBIC AND ANAEROBIC Blood Culture results may not be optimal due to an inadequate volume of blood received in culture bottles   Culture   Final    NO GROWTH 2 DAYS Performed at [facility], [address], [place], [place] [address]    Report Status PENDING  Incomplete  Resp Panel by RT-PCR (Flu A&B, Covid) Nasopharyngeal Swab     Status: None   Collection Time: 09/23/20  8:14 PM   Specimen: Nasopharyngeal Swab; Nasopharyngeal(NP) swabs in vial transport medium  Result Value Ref Range Status   SARS Coronavirus 2 by RT PCR NEGATIVE NEGATIVE Final    Comment: (NOTE) SARS-CoV-2 target nucleic acids are NOT DETECTED.  The SARS-CoV-2 RNA is generally detectable in upper respiratory specimens during the acute phase of infection. The lowest concentration of SARS-CoV-2 viral copies this assay can detect is 138 copies/mL. A negative result does not preclude SARS-Cov-2 infection and should not be used as the sole basis for treatment or other patient management decisions. A negative result may occur with  improper specimen collection/handling, submission of specimen other than nasopharyngeal swab, presence of viral mutation(s) within the areas targeted by this assay, and inadequate number of viral copies(<138 copies/mL). A negative result must be combined with clinical observations, patient history, and epidemiological information. The expected result is Negative.  Fact Sheet for Patients:  BloggerCourse.com  Fact Sheet for Healthcare Providers:  SeriousBroker.it  This test is no t yet approved or cleared by the [place] FDA and  has been authorized for detection and/or diagnosis of SARS-CoV-2 by FDA under an Emergency Use Authorization (EUA).  This EUA will remain  in  effect (meaning this test can be used) for the duration of the COVID-19 declaration under Section 564(b)(1) of the Act, 21 U.S.C.section 360bbb-3(b)(1), unless the authorization is terminated  or revoked sooner.       Influenza A by PCR NEGATIVE NEGATIVE Final   Influenza B by PCR NEGATIVE NEGATIVE Final    Comment: (NOTE) The Xpert Xpress SARS-CoV-2/FLU/RSV plus assay is intended as an aid in the diagnosis of influenza from Nasopharyngeal swab specimens and should not be used as a sole basis for treatment. Nasal washings and aspirates are unacceptable for Xpert Xpress SARS-CoV-2/FLU/RSV testing.  Fact Sheet for Patients: BloggerCourse.com  Fact Sheet for Healthcare Providers: SeriousBroker.it  This test is not yet approved or cleared by the [place] FDA and has been authorized for detection and/or diagnosis of SARS-CoV-2 by FDA under an Emergency Use Authorization (EUA). This EUA will remain in effect (meaning this test can be used) for the duration of the COVID-19 declaration under Section 564(b)(1) of the Act, 21 U.S.C. section 360bbb-3(b)(1), unless the authorization is terminated or revoked.  Performed at [facility], [address] [place], [place] [address]   Blood Culture (routine x 2)     Status: None (Preliminary result)   Collection Time: 09/23/20  8:14 PM   Specimen: BLOOD  Result Value Ref Range Status   Specimen Description BLOOD RIGHT FOREARM  Final   Special Requests   Final    BOTTLES DRAWN AEROBIC AND ANAEROBIC Blood Culture adequate volume   Culture   Final    NO GROWTH 2 DAYS Performed at [facility], [address], [place], [place] [address]    Report Status PENDING  Incomplete    RADIOLOGY STUDIES/RESULTS: CT Angio Chest PE W and/or Wo Contrast  Result Date: 09/23/2020 CLINICAL DATA:  Evaluate for acute pulmonary embolus. Short of breath. EXAM: CT  ANGIOGRAPHY CHEST WITH CONTRAST TECHNIQUE: Multidetector CT imaging of the chest was performed using the standard protocol during bolus administration of intravenous contrast. Multiplanar CT image reconstructions and MIPs were obtained to evaluate the vascular anatomy. CONTRAST:  56mL OMNIPAQUE IOHEXOL 350 MG/ML SOLN COMPARISON:  None. FINDINGS: Cardiovascular: The main pulmonary artery is patent. No central obstructing pulmonary embolus. Respiratory motion artifact results in image degradation at the level of the lower lobe segmental pulmonary arteries. Within this limitation there are no signs of lobar or segmental pulmonary artery embolus. Normal heart size. No pericardial effusion. Mediastinum/Nodes: Normal appearance of the thyroid gland. The trachea appears patent and is midline. Normal appearance of the esophagus. No enlarged axillary or supraclavicular lymph nodes. Prominent calcified and noncalcified mediastinal lymph nodes are identified. None of these meet CT criteria for adenopathy. There also prominent bilateral hilar nodes measuring up to 1 cm. Lungs/Pleura: No pleural effusion. Extensive bilateral ground-glass opacities with scattered areas of consolidation within both upper and lower lung zones. Upper Abdomen: No acute findings within the upper abdomen. Contour of the liver is slightly irregular. Relative hypertrophy of the lateral segment of left hepatic lobe and caudate lobe noted. Musculoskeletal: No chest wall abnormality. No acute or significant osseous findings. Review of the MIP images confirms the above findings. IMPRESSION: 1. Respiratory motion artifact results in image degradation at the level of the lower lobe segmental pulmonary arteries. Within this limitation there are no signs of lobar or segmental pulmonary artery embolus. 2. Extensive ground-glass opacities with scattered areas of consolidation noted throughout both lungs. Imaging findings concerning for multifocal pneumonia.  Atypical viral infection not  excluded. 3. Prominent mediastinal and hilar lymph nodes are likely reactive. 4. Morphologic features of the liver suggestive of early cirrhosis. Electronically Signed   By: Signa Kell M.D.   On: 09/23/2020 15:44     LOS: 2 days   Jeoffrey Massed, MD  [facility]    To contact the attending provider between 7A-7P or the covering provider during after hours 7P-7A, please log into the web site [email or web address] and access using universal [place] password for that web site. If you do not have the password, please call the hospital operator.  09/25/2020, 2:44 PM

## 2020-09-25 NOTE — Plan of Care (Signed)
Patient is currently resting in bed.  VSS. Remains on 5L [place]. SOB with ambulation. Voiding. Call bell within reach. Bed alarm on.  Given ativan to help patient rest.  Problem: Education: Goal: Knowledge of General Education information will improve Description: Including pain rating scale, medication(s)/side effects and non-pharmacologic comfort measures Outcome: Progressing   Problem: Health Behavior/Discharge Planning: Goal: Ability to manage health-related needs will improve Outcome: Progressing   Problem: Clinical Measurements: Goal: Ability to maintain clinical measurements within normal limits will improve Outcome: Progressing Goal: Will remain free from infection Outcome: Progressing Goal: Diagnostic test results will improve Outcome: Progressing Goal: Respiratory complications will improve Outcome: Progressing Goal: Cardiovascular complication will be avoided Outcome: Progressing   Problem: Activity: Goal: Risk for activity intolerance will decrease Outcome: Progressing   Problem: Nutrition: Goal: Adequate nutrition will be maintained Outcome: Progressing   Problem: Coping: Goal: Level of anxiety will decrease Outcome: Progressing   Problem: Elimination: Goal: Will not experience complications related to bowel motility Outcome: Progressing Goal: Will not experience complications related to urinary retention Outcome: Progressing   Problem: Pain Managment: Goal: General experience of comfort will improve Outcome: Progressing   Problem: Safety: Goal: Ability to remain free from injury will improve Outcome: Progressing   Problem: Skin Integrity: Goal: Risk for impaired skin integrity will decrease Outcome: Progressing

## 2020-09-25 NOTE — Progress Notes (Signed)
Due to BMI 56, ok to increase lovenox to 60mg  SQ qday per Dr. .  Jerral Ralph, PharmD, BCIDP, AAHIVP, CPP Infectious Disease Pharmacist 09/25/2020 1:08 PM

## 2020-09-25 NOTE — TOC Initial Note (Signed)
Transition of Care [facility]) - Initial/Assessment Note    Patient Details  Name: Ian Pham MRN: [id number] Date of Birth: 06-10-1966  [facility] CM/SW Contact:    Lawerance Sabal, RN Phone Number: 09/25/2020, 10:23 AM  Clinical Narrative:          Spoke w patient at bedside with help from [employer] interpreter. He confirms that he lives at home w wife and three children 56, 56, 1 yrs old. He confirms that he and wife both drive and have a car.  He confirms that he does not have PCP or health insurance. He is very interested in referral to [facility]. We discussed prescriptions at DC. Would like to have them filled by [facility] pharmacy and will provide MATCH.  We discussed potential for home oxygen and hopes that he can be weaned off it prior to going home.         Expected Discharge Plan: Home/Self Care Barriers to Discharge: Continued Medical Work up   Patient Goals and CMS Choice Patient states their goals for this hospitalization and ongoing recovery are:: to go home      Expected Discharge Plan and Services Expected Discharge Plan: Home/Self Care In-house Referral: Clinical Social Work Discharge Planning Services: CM Consult   Living arrangements for the past 2 months: Single Family Home                                      Prior Living Arrangements/Services Living arrangements for the past 2 months: Single Family Home Lives with:: Minor Children,Spouse                   Activities of Daily Living      Permission Sought/Granted                  Emotional Assessment              Admission diagnosis:  Atypical chest pain [R07.89] Hypoxia [R09.02] Dyspnea, unspecified type [R06.00] Suspected COVID-19 virus infection [Z20.822] COVID-19 [U07.1] Patient Active Problem List   Diagnosis Date Noted  . PNA (pneumonia) 09/23/2020  . COVID-19 09/23/2020  . Hypoxia    PCP:  Patient, No Pcp Per Pharmacy:   [facility]  [phone] - [place] Otto, [place] - [address] AT [facility] OF [address] [place] [address] Phone: [phone] Fax: [phone]     Social Determinants of Health (SDOH) Interventions    Readmission Risk Interventions No flowsheet data found.

## 2020-09-25 NOTE — Progress Notes (Addendum)
Inpatient Diabetes Program Recommendations  AACE/ADA: New Consensus Statement on Inpatient Glycemic Control (2015)  Target Ranges:  Prepandial:   less than 140 mg/dL      Peak postprandial:   less than 180 mg/dL (1-2 hours)      Critically ill patients:  140 - 180 mg/dL   Lab Results  Component Value Date   GLUCAP 191 (H) 09/25/2020   HGBA1C 7.2 (H) 09/23/2020    Review of Glycemic Control Results for Ian Pham, Ian Pham (MRN [id number]) as of 09/25/2020 10:52  Ref. Range 09/24/2020 07:29 09/24/2020 11:44 09/24/2020 17:27 09/24/2020 20:08 09/25/2020 07:15  Glucose-Capillary Latest Ref Range: 70 - 99 mg/dL 505 (H) 397 (H) 673 (H) 262 (H) 191 (H)    Current orders for Inpatient glycemic control: Lantus 10 units QHS, Novolog 0-15 units TID & 0-5 units QHS, Solumedrol 130 mg Q12H.  (will start prednisone tomorrow)  Inpatient Diabetes Program Recommendations:    Lantus 10 units BID (0.15 units/kg)  Addendum @ 1340 Spoke with patient at bedside using Status interpreter, Paula Compton # [phone] about new diagnosis. Discussed A1C results with him and explained what an A1C is, basic pathophysiology of DM Type 2, basic home care, basic diabetes diet nutrition principles, importance of checking CBGs and maintaining good CBG control to prevent long-term and short-term complications. Reviewed signs and symptoms of hyperglycemia and hypoglycemia and how to treat hypoglycemia at home. Also reviewed blood sugar goals at home.  RNs to provide ongoing basic DM education at bedside with this patient. Have ordered educational booklet, nursing education, and RD consult.  Please use each patient interaction to provide diabetes education. Please review Living Well with Diabetes booklet with the patient. Please allow patient to be actively engaged with diabetes management by allowing patient to check own glucose.    TOC is working with him to help establish PCP with [facility].  Explained importance of close follow up.   Provided him with a glucometer and demonstrated how to use.  He should check his blood sugar at least one time a day fasting and if he can mid afternoon.  He should bring his meter with him to PCP appointment for review.    Educated patient on CHO's, The Plate Method and importance of exercise/physical activity.  In chance, if he is to discharge on Metformin explained how it works, take with food and side effects.    Will continue to follow while inpatient.  Thank you, Dulce Sellar, RN, BSN Diabetes Coordinator Inpatient Diabetes Program [phone] (team pager from 8a-5p)     Will continue to follow while inpatient.  Thank you, Dulce Sellar, RN, BSN Diabetes Coordinator Inpatient Diabetes Program [phone] (team pager from 8a-5p)

## 2020-09-26 LAB — CBC WITH DIFFERENTIAL/PLATELET
Abs Immature Granulocytes: 0.04 10*3/uL (ref 0.00–0.07)
Basophils Absolute: 0 10*3/uL (ref 0.0–0.1)
Basophils Relative: 0 %
Eosinophils Absolute: 0 10*3/uL (ref 0.0–0.5)
Eosinophils Relative: 0 %
HCT: 35.2 % — ABNORMAL LOW (ref 39.0–52.0)
Hemoglobin: 12.4 g/dL — ABNORMAL LOW (ref 13.0–17.0)
Immature Granulocytes: 0 %
Lymphocytes Relative: 8 %
Lymphs Abs: 0.9 10*3/uL (ref 0.7–4.0)
MCH: 33.2 pg (ref 26.0–34.0)
MCHC: 35.2 g/dL (ref 30.0–36.0)
MCV: 94.4 fL (ref 80.0–100.0)
Monocytes Absolute: 0.6 10*3/uL (ref 0.1–1.0)
Monocytes Relative: 6 %
Neutro Abs: 8.6 10*3/uL — ABNORMAL HIGH (ref 1.7–7.7)
Neutrophils Relative %: 86 %
Platelets: 121 10*3/uL — ABNORMAL LOW (ref 150–400)
RBC: 3.73 MIL/uL — ABNORMAL LOW (ref 4.22–5.81)
RDW: 13.2 % (ref 11.5–15.5)
WBC: 10.1 10*3/uL (ref 4.0–10.5)
nRBC: 0 % (ref 0.0–0.2)

## 2020-09-26 LAB — COMPREHENSIVE METABOLIC PANEL
ALT: 21 U/L (ref 0–44)
AST: 28 U/L (ref 15–41)
Albumin: 2.2 g/dL — ABNORMAL LOW (ref 3.5–5.0)
Alkaline Phosphatase: 135 U/L — ABNORMAL HIGH (ref 38–126)
Anion gap: 7 (ref 5–15)
BUN: 20 mg/dL (ref 6–20)
CO2: 21 mmol/L — ABNORMAL LOW (ref 22–32)
Calcium: 8.6 mg/dL — ABNORMAL LOW (ref 8.9–10.3)
Chloride: 107 mmol/L (ref 98–111)
Creatinine, Ser: 0.85 mg/dL (ref 0.61–1.24)
GFR, Estimated: >60 mL/min (ref >60)
Glucose, Bld: 250 mg/dL — ABNORMAL HIGH (ref 70–99)
Potassium: 3.6 mmol/L (ref 3.5–5.1)
Sodium: 135 mmol/L (ref 135–145)
Total Bilirubin: 1.3 mg/dL — ABNORMAL HIGH (ref 0.3–1.2)
Total Protein: 7.2 g/dL (ref 6.5–8.1)

## 2020-09-26 LAB — MAGNESIUM: Magnesium: 2.1 mg/dL (ref 1.7–2.4)

## 2020-09-26 LAB — GLUCOSE, CAPILLARY
Glucose-Capillary: 176 mg/dL — ABNORMAL HIGH (ref 70–99)
Glucose-Capillary: 292 mg/dL — ABNORMAL HIGH (ref 70–99)
Glucose-Capillary: 253 mg/dL — ABNORMAL HIGH (ref 70–99)
Glucose-Capillary: 312 mg/dL — ABNORMAL HIGH (ref 70–99)

## 2020-09-26 LAB — C-REACTIVE PROTEIN: CRP: 0.5 mg/dL (ref <1.0)

## 2020-09-26 LAB — D-DIMER, QUANTITATIVE: D-Dimer, Quant: 0.5 ug/mL-FEU (ref 0.00–0.50)

## 2020-09-26 LAB — MRSA PCR SCREENING: MRSA by PCR: NEGATIVE

## 2020-09-26 LAB — FERRITIN: Ferritin: 251 ng/mL (ref 24–336)

## 2020-09-26 LAB — PHOSPHORUS: Phosphorus: 3.4 mg/dL (ref 2.5–4.6)

## 2020-09-26 MED ORDER — PREDNISONE 20 MG PO TABS
40.0000 mg | ORAL_TABLET | Freq: Every day | ORAL | Status: DC
  Administered 2020-09-26: 40 mg via ORAL
  Filled 2020-09-26: qty 2

## 2020-09-26 NOTE — Progress Notes (Signed)
SATURATION QUALIFICATIONS: (This note is used to comply with regulatory documentation for home oxygen)  Patient Saturations on Room Air at Rest = 92%  Patient Saturations on Room Air while Ambulating = 84%  Patient Saturations on 3 Liters of oxygen while Ambulating = 90%  Please briefly explain why patient needs home oxygen:

## 2020-09-26 NOTE — Progress Notes (Signed)
PROGRESS NOTE        PATIENT DETAILS Name: Ian Pham Age: 55 y.o. Sex: male Date of Birth: Sep 08, 1965 Admit Date: 09/23/2020 Admitting Physician Emeline General, MD XBW:[id number], No Pcp Per  Brief Narrative: Patient is a 55 y.o. male no past medical history-claims he had COVID-19 infection approximately 4 weeks before this hospital stay (numerous family members with similar symptoms) presented to the hospital with 2-week history of exertional dyspnea.  Significant events: 2/28>> admit with SOB-multifocal pneumonia on chest x-ray  Significant studies: 2/28>>CTA Chest: no PE, extensive ground glass opacities  Antimicrobial therapy: None  Microbiology data: 2/28>> microbiology: No growth 2/28>>Influenza/Covid PCR:neg 3/1>>Covid PCR:neg 3/2>>Resp virus panel: Negative  Procedures : None  Consults: None  DVT Prophylaxis : Prophylactic Lovenox  Subjective: Feels better-less shortness of breath-Down to 2 L of oxygen.  Assessment/Plan: Acute hypoxic respiratory failure due to multifocal pneumonia: Overall improving-Down to 2 L-remains on Rocephin/azithromycin tapering steroids.  Repeat 1 more dose of IV Lasix today.  May need home O2 on discharge.  HTN: Controlled-continue Lopressor-hold lisinopril as BP soft.  Newly diagnosed DM-2 (A1c 7.2 on 2/28) with uncontrolled hyperglycemia due to steroids: CBGs remain on the higher side-just increased Lantus yesterday-steroids being tapered down-VSS tomorrow before any further adjustment.     Recent Labs    09/25/20 2102 09/26/20 0736 09/26/20 1141  GLUCAP 382* 176* 292*   Liver Cirrhoses: seen on CTA chest-hepatitis B/hepatitis C serology negative-suspect EtOH related.  Stable for further work-up to be done in the outpatient setting by PCP.  Morbid Obesity: Estimated body mass index is 55.66 kg/m as calculated from the following:   Height as of this encounter: 5' (1.524 m).    Weight as of this encounter: 129.3 kg.   Diet: Diet Order            Diet Carb Modified Fluid consistency: Thin; Room service appropriate? No  Diet effective now                  Code Status: Full code   Family Communication: Bridget [phone]-left a voicemail on 3/3  Disposition Plan: Status is: Inpatient  Remains inpatient appropriate because:Inpatient level of care appropriate due to severity of illness   Dispo: The patient is from: Home              Anticipated d/c is to: Home              Patient currently is not medically stable to d/c.   Difficult to place patient No    Barriers to Discharge: Pneumonia-with hypoxia-on supplemental O2  Antimicrobial agents: Anti-infectives (From admission, onward)   Start     Dose/Rate Route Frequency Ordered Stop   09/26/20 1115  cefTRIAXone (ROCEPHIN) 2 g in sodium chloride 0.9 % 100 mL IVPB        2 g 200 mL/hr over 30 Minutes Intravenous Every 24 hours 09/25/20 1305 09/29/20 1114   09/24/20 1115  azithromycin (ZITHROMAX) 500 mg in sodium chloride 0.9 % 250 mL IVPB        500 mg 250 mL/hr over 60 Minutes Intravenous Every 24 hours 09/24/20 1100 09/29/20 1114   09/24/20 1115  cefTRIAXone (ROCEPHIN) 1 g in sodium chloride 0.9 % 100 mL IVPB  Status:  Discontinued        1 g 200 mL/hr over 30  Minutes Intravenous Every 24 hours 09/24/20 1100 09/25/20 1305   09/24/20 1000  remdesivir 100 mg in sodium chloride 0.9 % 100 mL IVPB  Status:  Discontinued       "Followed by" Linked Group Details   100 mg 200 mL/hr over 30 Minutes Intravenous Daily 09/23/20 1728 09/24/20 1100   09/23/20 1830  remdesivir 200 mg in sodium chloride 0.9% 250 mL IVPB       "Followed by" Linked Group Details   200 mg 580 mL/hr over 30 Minutes Intravenous Once 09/23/20 1728 09/23/20 2045       Time spent: 25 minutes-Greater than 50% of this time was spent in counseling, explanation of diagnosis, planning of further management, and  coordination of care.  MEDICATIONS: Scheduled Meds: . enoxaparin (LOVENOX) injection  60 mg Subcutaneous Q24H  . folic acid  1 mg Oral Daily  . insulin aspart  0-15 Units Subcutaneous TID WC  . insulin aspart  0-5 Units Subcutaneous QHS  . insulin aspart  4 Units Subcutaneous TID WC  . insulin glargine  15 Units Subcutaneous QHS  . Ipratropium-Albuterol  1 puff Inhalation Q6H  . lidocaine  1 patch Transdermal Q24H  . metoprolol tartrate  25 mg Oral BID  . multivitamin with minerals  1 tablet Oral Daily  . predniSONE  40 mg Oral Daily  . thiamine  100 mg Oral Daily   Or  . thiamine  100 mg Intravenous Daily   Continuous Infusions: . sodium chloride 10 mL/hr at 09/25/20 1241  . azithromycin 500 mg (09/26/20 1104)  . cefTRIAXone (ROCEPHIN)  IV 2 g (09/26/20 1028)   PRN Meds:.acetaminophen, guaiFENesin-dextromethorphan, LORazepam **OR** LORazepam   PHYSICAL EXAM: Vital signs: Vitals:   09/25/20 2058 09/26/20 0444 09/26/20 0604 09/26/20 0732  BP: 114/60 (!) 86/56 108/71   Pulse: 97 70    Resp: 18 20    Temp: (!) 97.2 F (36.2 C) (!) 97.5 F (36.4 C)    TempSrc: Oral Oral    SpO2: 100% 92%  92%  Weight:      Height:       Filed Weights   09/23/20 1715  Weight: 129.3 kg   Body mass index is 55.66 kg/m.   Gen Exam:Alert awake-not in any distress HEENT:atraumatic, normocephalic Chest: B/L clear to auscultation anteriorly CVS:S1S2 regular Abdomen:soft non tender, non distended Extremities:no edema Neurology: Non focal Skin: no rash  I have personally reviewed following labs and imaging studies  LABORATORY DATA: CBC: Recent Labs  Lab 09/23/20 1240 09/24/20 0335 09/25/20 0146 09/26/20 0041  WBC 6.9 4.6 10.8* 10.1  NEUTROABS  --  3.4 9.2* 8.6*  HGB 13.8 13.2 12.3* 12.4*  HCT 38.7* 39.6 34.4* 35.2*  MCV 93.9 97.3 93.5 94.4  PLT 153 124* 122* 121*    Basic Metabolic Panel: Recent Labs  Lab 09/23/20 1240 09/24/20 0335 09/25/20 0146 09/26/20 0041  NA  131* 130* 133* 135  K 3.8 4.3 4.2 3.6  CL 99 103 107 107  CO2 21* 19* 19* 21*  GLUCOSE 300* 276* 248* 250*  BUN 9 13 16 20   CREATININE 0.74 0.71 0.70 0.85  CALCIUM 9.0 9.0 8.7* 8.6*  MG  --  1.7 1.9 2.1  PHOS  --  2.7 3.5 3.4    GFR: Estimated Creatinine Clearance: 113.5 mL/min (by C-G formula based on SCr of 0.85 mg/dL).  Liver Function Tests: Recent Labs  Lab 09/23/20 1525 09/24/20 0335 09/25/20 0146 09/26/20 0041  AST 54* 42* 29 28  ALT  26 25 21 21   ALKPHOS 176* 153* 129* 135*  BILITOT 1.4* 1.4* 1.1 1.3*  PROT 8.1 8.0 7.5 7.2  ALBUMIN 2.5* 2.4* 2.2* 2.2*   No results for input(s): LIPASE, AMYLASE in the last 168 hours. No results for input(s): AMMONIA in the last 168 hours.  Coagulation Profile: No results for input(s): INR, PROTIME in the last 168 hours.  Cardiac Enzymes: No results for input(s): CKTOTAL, CKMB, CKMBINDEX, TROPONINI in the last 168 hours.  BNP (last 3 results) No results for input(s): PROBNP in the last 8760 hours.  Lipid Profile: Recent Labs    09/23/20 1627 09/23/20 2000  CHOL  --  160  HDL  --  50  LDLCALC  --  88  TRIG 125 111  CHOLHDL  --  3.2    Thyroid Function Tests: No results for input(s): TSH, T4TOTAL, FREET4, T3FREE, THYROIDAB in the last 72 hours.  Anemia Panel: Recent Labs    09/25/20 0146 09/26/20 0041  FERRITIN 239 251    Urine analysis:    Component Value Date/Time   COLORURINE YELLOW 09/12/2019 1330   APPEARANCEUR CLEAR 09/12/2019 1330   LABSPEC 1.016 09/12/2019 1330   PHURINE 7.0 09/12/2019 1330   GLUCOSEU >=500 (A) 09/12/2019 1330   HGBUR NEGATIVE 09/12/2019 1330   BILIRUBINUR NEGATIVE 09/12/2019 1330   KETONESUR NEGATIVE 09/12/2019 1330   PROTEINUR NEGATIVE 09/12/2019 1330   NITRITE NEGATIVE 09/12/2019 1330   LEUKOCYTESUR NEGATIVE 09/12/2019 1330    Sepsis Labs: Lactic Acid, Venous    Component Value Date/Time   LATICACIDVEN 1.2 09/23/2020 2029    MICROBIOLOGY: Recent Results (from the  past 240 hour(s))  Blood Culture (routine x 2)     Status: None (Preliminary result)   Collection Time: 09/23/20  4:27 PM   Specimen: BLOOD LEFT HAND  Result Value Ref Range Status   Specimen Description BLOOD LEFT HAND  Final   Special Requests   Final    BOTTLES DRAWN AEROBIC AND ANAEROBIC Blood Culture results may not be optimal due to an inadequate volume of blood received in culture bottles   Culture   Final    NO GROWTH 2 DAYS Performed at [facility], [address] [place], [place]    Report Status PENDING  Incomplete  Resp Panel by RT-PCR (Flu A&B, Covid) Nasopharyngeal Swab     Status: None   Collection Time: 09/23/20  8:14 PM   Specimen: Nasopharyngeal Swab; Nasopharyngeal(NP) swabs in vial transport medium  Result Value Ref Range Status   SARS Coronavirus 2 by RT PCR NEGATIVE NEGATIVE Final    Comment: (NOTE) SARS-CoV-2 target nucleic acids are NOT DETECTED.  The SARS-CoV-2 RNA is generally detectable in upper respiratory specimens during the acute phase of infection. The lowest concentration of SARS-CoV-2 viral copies this assay can detect is 138 copies/mL. A negative result does not preclude SARS-Cov-2 infection and should not be used as the sole basis for treatment or other patient management decisions. A negative result may occur with  improper specimen collection/handling, submission of specimen other than nasopharyngeal swab, presence of viral mutation(s) within the areas targeted by this assay, and inadequate number of viral copies(<138 copies/mL). A negative result must be combined with clinical observations, patient history, and epidemiological information. The expected result is Negative.  Fact Sheet for Patients:  09/25/20  Fact Sheet for Healthcare Providers:  BloggerCourse.com  This test is no t yet approved or cleared by the [employer] and  has been authorized  for  detection and/or diagnosis of SARS-CoV-2 by FDA under an Emergency Use Authorization (EUA). This EUA will remain  in effect (meaning this test can be used) for the duration of the COVID-19 declaration under Section 564(b)(1) of the Act, 21 U.S.C.section 360bbb-3(b)(1), unless the authorization is terminated  or revoked sooner.       Influenza A by PCR NEGATIVE NEGATIVE Final   Influenza B by PCR NEGATIVE NEGATIVE Final    Comment: (NOTE) The Xpert Xpress SARS-CoV-2/FLU/RSV plus assay is intended as an aid in the diagnosis of influenza from Nasopharyngeal swab specimens and should not be used as a sole basis for treatment. Nasal washings and aspirates are unacceptable for Xpert Xpress SARS-CoV-2/FLU/RSV testing.  Fact Sheet for Patients: BloggerCourse.com  Fact Sheet for Healthcare Providers: [employer]it  This test is not yet approved or cleared by the [place] FDA and has been authorized for detection and/or diagnosis of SARS-CoV-2 by FDA under an Emergency Use Authorization (EUA). This EUA will remain in effect (meaning this test can be used) for the duration of the COVID-19 declaration under Section 564(b)(1) of the Act, 21 U.S.C. section 360bbb-3(b)(1), unless the authorization is terminated or revoked.  Performed at [facility], [address], [place], [place] [address]   Blood Culture (routine x 2)     Status: None (Preliminary result)   Collection Time: 09/23/20  8:14 PM   Specimen: BLOOD  Result Value Ref Range Status   Specimen Description BLOOD RIGHT FOREARM  Final   Special Requests   Final    BOTTLES DRAWN AEROBIC AND ANAEROBIC Blood Culture adequate volume   Culture   Final    NO GROWTH 2 DAYS Performed at [facility], [address], [place], [place] [address]    Report Status PENDING  Incomplete  Respiratory (~20 pathogens) panel by PCR     Status: None   Collection Time: 09/25/20  12:27 PM   Specimen: Nasopharyngeal Swab; Respiratory  Result Value Ref Range Status   Adenovirus NOT DETECTED NOT DETECTED Final   Coronavirus 229E NOT DETECTED NOT DETECTED Final    Comment: (NOTE) The Coronavirus on the Respiratory Panel, DOES NOT test for the novel  Coronavirus (2019 nCoV)    Coronavirus HKU1 NOT DETECTED NOT DETECTED Final   Coronavirus NL63 NOT DETECTED NOT DETECTED Final   Coronavirus OC43 NOT DETECTED NOT DETECTED Final   Metapneumovirus NOT DETECTED NOT DETECTED Final   Rhinovirus / Enterovirus NOT DETECTED NOT DETECTED Final   Influenza A NOT DETECTED NOT DETECTED Final   Influenza B NOT DETECTED NOT DETECTED Final   Parainfluenza Virus 1 NOT DETECTED NOT DETECTED Final   Parainfluenza Virus 2 NOT DETECTED NOT DETECTED Final   Parainfluenza Virus 3 NOT DETECTED NOT DETECTED Final   Parainfluenza Virus 4 NOT DETECTED NOT DETECTED Final   Respiratory Syncytial Virus NOT DETECTED NOT DETECTED Final   Bordetella pertussis NOT DETECTED NOT DETECTED Final   Bordetella Parapertussis NOT DETECTED NOT DETECTED Final   Chlamydophila pneumoniae NOT DETECTED NOT DETECTED Final   Mycoplasma pneumoniae NOT DETECTED NOT DETECTED Final    Comment: Performed at [facility], [address], [place], [place] [address]  MRSA PCR Screening     Status: None   Collection Time: 09/26/20  7:16 AM   Specimen: Nasopharyngeal  Result Value Ref Range Status   MRSA by PCR NEGATIVE NEGATIVE Final    Comment:        The GeneXpert MRSA  Assay (FDA approved for NASAL specimens only), is one component of a comprehensive MRSA colonization surveillance program. It is not intended to diagnose MRSA infection nor to guide or monitor treatment for MRSA infections. Performed at [facility], [address] [place], [place] [address]     RADIOLOGY STUDIES/RESULTS: No results found.   LOS: 3 days   Jeoffrey Massed, MD  [facility]    To contact the attending  provider between 7A-7P or the covering provider during after hours 7P-7A, please log into the web site [email or web address] and access using universal [place] password for that web site. If you do not have the password, please call the hospital operator.  09/26/2020, 12:22 PM

## 2020-09-26 NOTE — Discharge Instructions (Signed)
Carbohydrate Counting For People With Diabetes  Foods with carbohydrates make your blood glucose level go up. Learning how to count carbohydrates can help you control your blood glucose levels. First, identify the foods you eat that contain carbohydrates. Then, using the Foods with Carbohydrates chart, determine about how much carbohydrates are in your meals and snacks. Make sure you are eating foods with fiber, protein, and healthy fat along with your carbohydrate foods. Foods with Carbohydrates The following table shows carbohydrate foods that have about 15 grams of carbohydrate each. Using measuring cups, spoons, or a food scale when you first begin learning about carbohydrate counting can help you learn about the portion sizes you typically eat. The following foods have 15 grams carbohydrate each:  Grains . 1 slice bread (1 ounce)  . 1 small tortilla (6-inch size)  .  large bagel (1 ounce)  . 1/3 cup pasta or rice (cooked)  .  hamburger or hot dog bun ( ounce)  .  cup cooked cereal  .  to  cup ready-to-eat cereal  . 2 taco shells (5-inch size) Fruit . 1 small fresh fruit ( to 1 cup)  .  medium banana  . 17 small grapes (3 ounces)  . 1 cup melon or berries  .  cup canned or frozen fruit  . 2 tablespoons dried fruit (blueberries, cherries, cranberries, raisins)  .  cup unsweetened fruit juice  Starchy Vegetables .  cup cooked beans, peas, corn, potatoes/sweet potatoes  .  large baked potato (3 ounces)  . 1 cup acorn or butternut squash  Snack Foods . 3 to 6 crackers  . 8 potato chips or 13 tortilla chips ( ounce to 1 ounce)  . 3 cups popped popcorn  Dairy . 3/4 cup (6 ounces) nonfat plain yogurt, or yogurt with sugar-free sweetener  . 1 cup milk  . 1 cup plain rice, soy, coconut or flavored almond milk Sweets and Desserts .  cup ice cream or frozen yogurt  . 1 tablespoon jam, jelly, pancake syrup, table sugar, or honey  . 2 tablespoons light pancake syrup  . 1 inch  square of frosted cake or 2 inch square of unfrosted cake  . 2 small cookies (2/3 ounce each) or  large cookie  Sometimes you'll have to estimate carbohydrate amounts if you don't know the exact recipe. One cup of mixed foods like soups can have 1 to 2 carbohydrate servings, while some casseroles might have 2 or more servings of carbohydrate. Foods that have less than 20 calories in each serving can be counted as "free" foods. Count 1 cup raw vegetables, or  cup cooked non-starchy vegetables as "free" foods. If you eat 3 or more servings at one meal, then count them as 1 carbohydrate serving.  Foods without Carbohydrates  Not all foods contain carbohydrates. Meat, some dairy, fats, non-starchy vegetables, and many beverages don't contain carbohydrate. So when you count carbohydrates, you can generally exclude chicken, pork, beef, fish, seafood, eggs, tofu, cheese, butter, sour cream, avocado, nuts, seeds, olives, mayonnaise, water, black coffee, unsweetened tea, and zero-calorie drinks. Vegetables with no or low carbohydrate include green beans, cauliflower, tomatoes, and onions. How much carbohydrate should I eat at each meal?  Carbohydrate counting can help you plan your meals and manage your weight. Following are some starting points for carbohydrate intake at each meal. Work with your registered dietitian nutritionist to find the best range that works for your blood glucose and weight.   To Lose Weight To  Maintain Weight  Women 2 - 3 carb servings 3 - 4 carb servings  Men 3 - 4 carb servings 4 - 5 carb servings  Checking your blood glucose after meals will help you know if you need to adjust the timing, type, or number of carbohydrate servings in your meal plan. Achieve and keep a healthy body weight by balancing your food intake and physical activity.  Tips How should I plan my meals?  Plan for half the food on your plate to include non-starchy vegetables, like salad greens, broccoli, or  carrots. Try to eat 3 to 5 servings of non-starchy vegetables every day. Have a protein food at each meal. Protein foods include chicken, fish, meat, eggs, or beans (note that beans contain carbohydrate). These two food groups (non-starchy vegetables and proteins) are low in carbohydrate. If you fill up your plate with these foods, you will eat less carbohydrate but still fill up your stomach. Try to limit your carbohydrate portion to  of the plate.  What fats are healthiest to eat?  Diabetes increases risk for heart disease. To help protect your heart, eat more healthy fats, such as olive oil, nuts, and avocado. Eat less saturated fats like butter, cream, and high-fat meats, like bacon and sausage. Avoid trans fats, which are in all foods that list "partially hydrogenated oil" as an ingredient. What should I drink?  Choose drinks that are not sweetened with sugar. The healthiest choices are water, carbonated or seltzer waters, and tea and coffee without added sugars.  Sweet drinks will make your blood glucose go up very quickly. One serving of soda or energy drink is  cup. It is best to drink these beverages only if your blood glucose is low.  Artificially sweetened, or diet drinks, typically do not increase your blood glucose if they have zero calories in them. Read labels of beverages, as some diet drinks do have carbohydrate and will raise your blood glucose. Label Reading Tips Read Nutrition Facts labels to find out how many grams of carbohydrate are in a food you want to eat. Don't forget: sometimes serving sizes on the label aren't the same as how much food you are going to eat, so you may need to calculate how much carbohydrate is in the food you are serving yourself.   Carbohydrate Counting for People with Diabetes Sample 1-Day Menu  Breakfast  cup yogurt, low fat, low sugar (1 carbohydrate serving)   cup cereal, ready-to-eat, unsweetened (1 carbohydrate serving)  1 cup strawberries (1  carbohydrate serving)   cup almonds ( carbohydrate serving)  Lunch 1, 5 ounce can chunk light tuna  2 ounces cheese, low fat cheddar  6 whole wheat crackers (1 carbohydrate serving)  1 small apple (1 carbohydrate servings)   cup carrots ( carbohydrate serving)   cup snap peas  1 cup 1% milk (1 carbohydrate serving)   Evening Meal Stir fry made with: 3 ounces chicken  1 cup brown rice (3 carbohydrate servings)   cup broccoli ( carbohydrate serving)   cup green beans   cup onions  1 tablespoon olive oil  2 tablespoons teriyaki sauce ( carbohydrate serving)  Evening Snack 1 extra small banana (1 carbohydrate serving)  1 tablespoon peanut butter   Carbohydrate Counting for People with Diabetes Vegan Sample 1-Day Menu  Breakfast 1 cup cooked oatmeal (2 carbohydrate servings)   cup blueberries (1 carbohydrate serving)  2 tablespoons flaxseeds  1 cup soymilk fortified with calcium and vitamin D  1  cup coffee  Lunch 2 slices whole wheat bread (2 carbohydrate servings)   cup baked tofu   cup lettuce  2 slices tomato  2 slices avocado   cup baby carrots ( carbohydrate serving)  1 orange (1 carbohydrate serving)  1 cup soymilk fortified with calcium and vitamin D   Evening Meal Burrito made with: 1 6-inch corn tortilla (1 carbohydrate serving)  1 cup refried vegetarian beans (2 carbohydrate servings)   cup chopped tomatoes   cup lettuce   cup salsa  1/3 cup brown rice (1 carbohydrate serving)  1 tablespoon olive oil for rice   cup zucchini   Evening Snack 6 small whole grain crackers (1 carbohydrate serving)  2 apricots ( carbohydrate serving)   cup unsalted peanuts ( carbohydrate serving)    Carbohydrate Counting for People with Diabetes Vegetarian (Lacto-Ovo) Sample 1-Day Menu  Breakfast 1 cup cooked oatmeal (2 carbohydrate servings)   cup blueberries (1 carbohydrate serving)  2 tablespoons flaxseeds  1 egg  1 cup 1% milk (1 carbohydrate serving)  1  cup coffee  Lunch 2 slices whole wheat bread (2 carbohydrate servings)  2 ounces low-fat cheese   cup lettuce  2 slices tomato  2 slices avocado   cup baby carrots ( carbohydrate serving)  1 orange (1 carbohydrate serving)  1 cup unsweetened tea  Evening Meal Burrito made with: 1 6-inch corn tortilla (1 carbohydrate serving)   cup refried vegetarian beans (1 carbohydrate serving)   cup tomatoes   cup lettuce   cup salsa  1/3 cup brown rice (1 carbohydrate serving)  1 tablespoon olive oil for rice   cup zucchini  1 cup 1% milk (1 carbohydrate serving)  Evening Snack 6 small whole grain crackers (1 carbohydrate serving)  2 apricots ( carbohydrate serving)   cup unsalted peanuts ( carbohydrate serving)    Copyright 2020  Academy of Nutrition and Dietetics. All rights reserved.  Using Nutrition Labels: Carbohydrate  . Serving Size  . Look at the serving size. All the information on the label is based on this portion. Jolyne Loa Per Container  . The number of servings contained in the package. . Guidelines for Carbohydrate  . Look at the total grams of carbohydrate in the serving size.  . 1 carbohydrate choice = 15 grams of carbohydrate. Range of Carbohydrate Grams Per Choice  Carbohydrate Grams/Choice Carbohydrate Choices  6-10   11-20 1  21-25 1  26-35 2  36-40 2  41-50 3  51-55 3  56-65 4  66-70 4  71-80 5    Copyright 2020  Academy of Nutrition and Dietetics. All rights reserved.    Contar carbohidratos y la diabetes  Por qu es importante el conteo de carbohidratos?  [employer] las porciones de carbohidratos ayuda a Sales executive nivel de glucosa (azcar) en su sangre para que se sienta mejor.  . El equilibrio entre los carbohidratos que come y Dietitian determina el nivel de glucosa que tendr en la sangre despus de comer.  [employer] Aid carbohidratos tambin le ayudar a planificar sus comidas.   Qu alimentos contienen carbohidratos?  Entre  los alimentos con carbohidratos se incluyen:  . Panes, galletas saladas y cereales  . Pastas, arroz y granos  . Vegetales (verduras) con almidn, como papas, elote (maz o choclo) y chcharos (guisantes o arvejas)  . Frijoles (habichuelas) y legumbres  . Leche, leche de soya y Dentist  . Nils Pyle y jugos de fruta  . Dulces como pasteles,  galletas, helados, mermeladas y jaleas   Porciones de carbohidratos  Al planificar comidas para la diabetes, recuerde que un alimento con 1 porcin de carbohidratos contiene aproximadamente 15 gramos de carbohidratos:  . Revise el tamao de las porciones con tazas y cucharas de medir o con una pesa de alimentos.  Clint Lipps los Datos de Nutricin en las etiquetas de los alimentos para saber cuntos gramos de carbohidratos contienen los alimentos que come.   Los [employer] de este folleto muestran porciones que contienen cerca de 15 gramos de carbohidratos. Copyright Academy of Nutrition and Dietetics. This handout may be duplicated for client education. Carbohydrate Counting for Diabetes (Spanish) - 2   Consejos para planificar sus comidas  . Un Plan de Alimentacin indica cuntas porciones de carbohidratos consumir en sus comidas y refrigerios (snacks). Para muchos adultos es adecuado comer 3 a 5 porciones de carbohidratos en cada comida y de 1 a 2 porciones de carbohidratos, en cada refrigerio.  . En un Plan de Alimentacin diaria saludable, la mayora de los carbohidratos provienen de:  o Al menos 6 porciones de frutas y vegetales sin almidn  o Al menos 6 porciones de Forensic scientist, frijoles y Sports administrator con almidn, con al menos 3 de estas porciones de granos integrales (enteros)  o Al menos 2 porciones de Teaching laboratory technician o productos lcteos  . Revise regularmente su nivel de glucosa en la sangre. Esto puede indicarle si necesita ajustar las horas a las que consume carbohidratos.  . [place] alimentos que contienen [place], como granos [place], y comer muy pocos  alimentos salados es bueno para su salud.  . Coma 4 a 6 onzas de carne u otros alimentos con protenas (como hamburguesas de soya) cada da. Elija fuentes de protena bajas en grasa, como carne de res y de cerdo bajas en grasa, pollo, pescado, queso bajo en grasa o alimentos vegetarianos como la soya.  . Coma algunas grasas saludables, como aceite de [place], de canola y nueces.  . Coma muy pocas grasas saturadas. Estas grasas no son saludables y se Merchandiser, retail, la crema y las carnes con mucha grasa, como el tocino (tocineta) y las salchichas o Advertising copywriter.  . Coma muy pocas o nada de grasas trans. Estas grasas no son saludables y se encuentran en todos los alimentos que contienen aceites "parcialmente hidrogenados" en su lista de ingredientes.   Consejos para leer etiquetas  En los Datos de Nutricin de las etiquetas aparece una lista con el total de gramos de carbohidratos en una porcin estndar. La porcin estndar puede ser mayor o menor que 1 porcin de carbohidratos. Para saber cuntas porciones de carbohidratos hay en un alimento:  . Primero mire el tamao de la porcin estndar de la etiqueta.  Tia Alert verifique el total de gramos de carbohidratos. Esta es la cantidad de carbohidratos en 1 porcin estndar. Divida el total de gramos de carbohidratos por 15. Este nmero equivale al nmero de porciones de carbohidratos en 1 porcin estndar. Recuerde: 1 porcin de carbohidratos equivale a 15 gramos de carbohidratos.  . Nota: Puede ignorar los gramos de [employer] Datos de Nutricin, ya que estn incluidos en el total de gramos de carbohidratos.  Copyright Academy of Nutrition and Dietetics. This handout may be duplicated for client education. Carbohydrate Counting for Diabetes (Spanish) - 3   Listas de alimentos para el conteo de carbohidratos  1 porcin = cerca de 15 gramos de carbohidratos  Almidones  . 1 rebanada de pan (  1 onza)  . 1 tortilla (6 pulgadas)  .  rosca de pan  (bagel) grande (1 onza)  . 2 tortillas para taco (5 pulgadas)  .  pan para hamburguesa o para salchicha (hot dog) (3/4 onza)  .  taza de cereal listo para comer sin endulzar  .  taza de cereal cocido  . 1 taza de sopa a base de caldo  . 4-6 galletitas saladas  . ? taza de pasta o arroz (cocidos)  .  taza de frijoles, chcharos, granos de elote, camotes (batatas, boniatos), calabaza (zapallo), pur de papas o papas hervidas (cocidos)  .  papa grande asada (3 onzas)  .  onza de pretzels, papitas o totopos (tortilla chips)  . 3 tazas de palomitas de maz Best boy) (ya preparadas)   Nils Pyle  . 1 fruta fresca pequea ( a 1 taza)  .  taza de fruta enlatada o congelada  . 17 uvas pequeas (3 onzas)  . 1 taza de meln, bayas (moras)  .  vaso de jugo de fruta  . 2 cucharadas de frutas secas (arndanos azules/blueberries, cerezas, arndanos rojos/cranberries, frutas surtidas, uvas pasas/pasitas)  Leche  . 1 taza de [employer] o reducida en grasa  . 1 taza de leche de soya  . ? taza de yogur descremado endulzado con un edulcorante sin azcar (6 onzas)   Dulces y postres  . pastel cuadrado de 2 pulgadas (sin betn/cobertura)  . 2 galletitas dulces (? onzas)  .  taza de helado o yogur congelado  .  taza de sorbete (sherbet) o nieve (sorbet)  . 1 cucharada de jarabe (sirope), mermelada, jalea, azcar o miel  . 2 cucharadas de jarabe bajo en caloras  Copyright Academy of Nutrition and Dietetics. This handout may be duplicated for client education. Carbohydrate Counting for Diabetes (Spanish) - 4   Otros alimentos  . Cuente 1 taza de vegetales crudos o  taza de vegetales sin almidn, cocidas, como porciones de alimentos con cero (0) carbohidratos o "sin restriccin." Si come 3 o ms porciones en una comida, cuntelas como 1 porcin de carbohidratos.  . Los alimentos que contienen menos de 20 caloras en cada porcin tambin pueden contarse como porciones con cero carbohidratos  o alimentos "sin restriccin."  . Cuente 1 taza de guiso (estofado) u otros alimentos combinados como 2 porciones de carbohidratos.   Notas: Copyright Academy of Nutrition and Dietetics. This handout may be duplicated for client education. Carbohydrate Counting for Diabetes (Spanish) - 5   Contar carbohidratos y la diabetes: Ejemplo de men para 1 da [place]ay[place]  1 pltano/banana pequeo (1 carbohidrato)   taza de hojuelas de maz (cornflakes) (1 carbohidrato)  1 taza de leche descremada o baja en grasa (1 carbohidrato)  1 rebanada de pan de trigo integral (1 carbohidrato)  1 cucharadita de margarina   [place]mu[place]  2 onzas de rebanadas de [place]  2 rebanadas de pan de trigo integral (2 carbohidratos)  2 hojas de Company secretary  4 palitos de apio  4 palitos de zanahoria  1 manzana mediana (1 carbohidrato)  1 taza de leche descremada o baja en grasa (1 carbohidrato)   Refrigerio  2 cucharadas de uvas pasas/pasitas (1 carbohidrato)   onzas de mini pretzels sin sal (1 carbohidrato)   Cena  3 onzas de carne asada de res, magra   papa grande asada (2 carbohidratos)  1 cucharada de crema agria reducida en grasa   taza de ejotes/habichuelas verdes/chauchas  1 taza de ensalada de vegetales  1 cucharada de aderezo para ensaladas reducido en caloras  1 panecillo de trigo integral (1 carbohidrato)  1 cucharadita de margarina  1 taza de bolitas de meln (1 carbohidrato)   Refrigerio  6 onzas de yogur de frutas bajo en grasa, sin azcar (1 carbohidrato)  2 cucharadas de nueces sin sal    Roslyn Smiling, MS, RD, LDN Clinical Dietitian Office phone [phone]

## 2020-09-26 NOTE — Progress Notes (Signed)
Initial Nutrition Assessment  DOCUMENTATION CODES:   Morbid obesity  INTERVENTION:  Provide Glucerna Shake po BID, each supplement provides 220 kcal and 10 grams of protein.  Diet education handout given.   NUTRITION DIAGNOSIS:   Increased nutrient needs related to acute illness as evidenced by estimated needs.  GOAL:   Patient will meet greater than or equal to 90% of their needs  MONITOR:   PO intake,Supplement acceptance,Skin,Weight trends,Labs,I & O's  REASON FOR ASSESSMENT:   Consult Diet education  ASSESSMENT:   55 year old male with recent COVID19 infection 4 weeks ago presents with SOB and dyspnea. Pt with acute hypoxic respiratory failure due to multifocal pneumonia and new Type 2 diabetes mellitus diagnosis.  Pt unavailable during attempted time of contact. RD unable to obtain most recent pt nutrition history at this time. RD to order nutritional supplements to aid in caloric and protein needs. RD additionally consulted for diet education regarding new onset diabetes. Handouts "Carbohydrate counting for People with Diabetes" in both [place] and Spanish language placed in pt discharge instructions.Unable to complete Nutrition-Focused physical exam at this time.   Labs and medications reviewed.   Diet Order:   Diet Order            Diet Carb Modified Fluid consistency: Thin; Room service appropriate? No  Diet effective now                 EDUCATION NEEDS:   Education needs have been addressed  Skin:  Skin Assessment: Reviewed RN Assessment  Last BM:  3/2  Height:   Ht Readings from Last 1 Encounters:  09/25/20 5' (1.524 m)    Weight:   Wt Readings from Last 1 Encounters:  09/23/20 129.3 kg    BMI:  Body mass index is 55.66 kg/m.  Estimated Nutritional Needs:   Kcal:  1900-2100  Protein:  100-115 grams  Fluid:  >/= 1.9 L/day  Roslyn Smiling, MS, RD, LDN RD pager number/after hours weekend pager number on Amion.

## 2020-09-27 ENCOUNTER — Other Ambulatory Visit: Payer: Self-pay | Admitting: Internal Medicine

## 2020-09-27 LAB — CBC WITH DIFFERENTIAL/PLATELET
Abs Immature Granulocytes: 0.07 10*3/uL (ref 0.00–0.07)
Basophils Absolute: 0 10*3/uL (ref 0.0–0.1)
Basophils Relative: 0 %
Eosinophils Absolute: 0 10*3/uL (ref 0.0–0.5)
Eosinophils Relative: 0 %
HCT: 34.6 % — ABNORMAL LOW (ref 39.0–52.0)
Hemoglobin: 12.2 g/dL — ABNORMAL LOW (ref 13.0–17.0)
Immature Granulocytes: 1 %
Lymphocytes Relative: 13 %
Lymphs Abs: 0.9 10*3/uL (ref 0.7–4.0)
MCH: 33 pg (ref 26.0–34.0)
MCHC: 35.3 g/dL (ref 30.0–36.0)
MCV: 93.5 fL (ref 80.0–100.0)
Monocytes Absolute: 0.6 10*3/uL (ref 0.1–1.0)
Monocytes Relative: 8 %
Neutro Abs: 5.6 10*3/uL (ref 1.7–7.7)
Neutrophils Relative %: 78 %
Platelets: 116 10*3/uL — ABNORMAL LOW (ref 150–400)
RBC: 3.7 MIL/uL — ABNORMAL LOW (ref 4.22–5.81)
RDW: 13.2 % (ref 11.5–15.5)
WBC: 7.2 10*3/uL (ref 4.0–10.5)
nRBC: 0 % (ref 0.0–0.2)

## 2020-09-27 LAB — COMPREHENSIVE METABOLIC PANEL
ALT: 29 U/L (ref 0–44)
AST: 31 U/L (ref 15–41)
Albumin: 2.2 g/dL — ABNORMAL LOW (ref 3.5–5.0)
Alkaline Phosphatase: 127 U/L — ABNORMAL HIGH (ref 38–126)
Anion gap: 8 (ref 5–15)
BUN: 23 mg/dL — ABNORMAL HIGH (ref 6–20)
CO2: 20 mmol/L — ABNORMAL LOW (ref 22–32)
Calcium: 8.4 mg/dL — ABNORMAL LOW (ref 8.9–10.3)
Chloride: 105 mmol/L (ref 98–111)
Creatinine, Ser: 0.73 mg/dL (ref 0.61–1.24)
GFR, Estimated: >60 mL/min (ref >60)
Glucose, Bld: 236 mg/dL — ABNORMAL HIGH (ref 70–99)
Potassium: 3.8 mmol/L (ref 3.5–5.1)
Sodium: 133 mmol/L — ABNORMAL LOW (ref 135–145)
Total Bilirubin: 1 mg/dL (ref 0.3–1.2)
Total Protein: 6.6 g/dL (ref 6.5–8.1)

## 2020-09-27 LAB — GLUCOSE, CAPILLARY
Glucose-Capillary: 186 mg/dL — ABNORMAL HIGH (ref 70–99)
Glucose-Capillary: 188 mg/dL — ABNORMAL HIGH (ref 70–99)
Glucose-Capillary: 275 mg/dL — ABNORMAL HIGH (ref 70–99)
Glucose-Capillary: 202 mg/dL — ABNORMAL HIGH (ref 70–99)

## 2020-09-27 LAB — D-DIMER, QUANTITATIVE: D-Dimer, Quant: 0.83 ug/mL-FEU — ABNORMAL HIGH (ref 0.00–0.50)

## 2020-09-27 LAB — C-REACTIVE PROTEIN: CRP: 0.6 mg/dL (ref <1.0)

## 2020-09-27 MED ORDER — METOPROLOL TARTRATE 25 MG PO TABS: 25.0000 mg | ORAL_TABLET | Freq: Two times a day (BID) | ORAL | 0 refills | Status: DC

## 2020-09-27 MED ORDER — FUROSEMIDE 10 MG/ML IJ SOLN
20.0000 mg | Freq: Once | INTRAMUSCULAR | Status: AC
  Administered 2020-09-27: 20 mg via INTRAVENOUS
  Filled 2020-09-27: qty 2

## 2020-09-27 MED ORDER — METFORMIN HCL 500 MG PO TABS: 500.0000 mg | ORAL_TABLET | Freq: Two times a day (BID) | ORAL | 11 refills | Status: DC

## 2020-09-27 MED ORDER — AMOXICILLIN-POT CLAVULANATE 875-125 MG PO TABS: 1.0000 | ORAL_TABLET | Freq: Two times a day (BID) | ORAL | 0 refills | Status: AC

## 2020-09-27 MED ORDER — PREDNISONE 5 MG PO TABS
30.0000 mg | ORAL_TABLET | Freq: Every day | ORAL | Status: DC
  Administered 2020-09-27: 30 mg via ORAL
  Filled 2020-09-27: qty 2

## 2020-09-27 MED ORDER — AZITHROMYCIN 500 MG PO TABS
500.0000 mg | ORAL_TABLET | Freq: Once | ORAL | Status: AC
  Administered 2020-09-28: 500 mg via ORAL
  Filled 2020-09-27: qty 1

## 2020-09-27 MED FILL — METOPROLOL TARTRATE 25 MG T: 25 | 30 days supply | Qty: 60 | Fill #0

## 2020-09-27 MED FILL — AMOX-CLAV 875-125 MG TABLET: 875-125 | 3 days supply | Qty: 6 | Fill #0

## 2020-09-27 MED FILL — metFORMIN HCL 500 MG TABS: 500 | 30 days supply | Qty: 60 | Fill #0

## 2020-09-27 NOTE — Plan of Care (Signed)
  Problem: Education: Goal: Knowledge of General Education information will improve Description: Including pain rating scale, medication(s)/side effects and non-pharmacologic comfort measures Outcome: Progressing   Problem: Health Behavior/Discharge Planning: Goal: Ability to manage health-related needs will improve Outcome: Progressing   Problem: Clinical Measurements: Goal: Ability to maintain clinical measurements within normal limits will improve Outcome: Progressing Goal: Will remain free from infection Outcome: Progressing   No acute events took place overnight. Pt 87% on RA while sleeping so placed pt on 2L of O2. Otherwise vital signs stable, will continue to monitor.

## 2020-09-27 NOTE — Progress Notes (Signed)
Patient sitting in bed alert and oriented. Educated patient on using IS with video interpretor. No questions at this time.

## 2020-09-27 NOTE — TOC Progression Note (Signed)
Transition of Care [facility]) - Progression Note    Patient Details  Name: Ian Pham MRN: [id number] Date of Birth: 14-Jun-1966  [facility] CM/SW Contact  [place] Pink, Student-Social Work Phone Number: 09/27/2020, 4:36 PM  Clinical Narrative:    CSW received consult to provide ETOH resources for patient. CSW arranged spanish speaking interpreter to communicate with patient. Patient declined ETOH resources and reported that he would need a ride home when he is discharged tomorrow. Patient's medications sent to [facility] pharmacy, they will be stored in the main office for tomorrow. Patient's address is correct on the face sheet.    Expected Discharge Plan: Home/Self Care Barriers to Discharge: Continued Medical Work up  Expected Discharge Plan and Services Expected Discharge Plan: Home/Self Care In-house Referral: Clinical Social Work Discharge Planning Services: CM Consult   Living arrangements for the past 2 months: Single Family Home                                       Social Determinants of Health (SDOH) Interventions    Readmission Risk Interventions No flowsheet data found.

## 2020-09-27 NOTE — Progress Notes (Signed)
PROGRESS NOTE        PATIENT DETAILS Name: Ian Pham Age: 55 y.o. Sex: male Date of Birth: 11-13-1965 Admit Date: 09/23/2020 Admitting Physician Emeline General, MD JKD:[id number], No Pcp Per  Brief Narrative: Patient is a 55 y.o. male no past medical history-claims he had COVID-19 infection approximately 4 weeks before this hospital stay (numerous family members with similar symptoms) presented to the hospital with 2-week history of exertional dyspnea.  Significant events: 2/28>> admit with SOB-multifocal pneumonia on chest x-ray  Significant studies: 2/28>>CTA Chest: no PE, extensive ground glass opacities  Antimicrobial therapy: None  Microbiology data: 2/28>> microbiology: No growth 2/28>>Influenza/Covid PCR:neg 3/1>>Covid PCR:neg 3/2>>Resp virus panel: Negative  Procedures : None  Consults: None  DVT Prophylaxis : Prophylactic Lovenox  Subjective: Feels much better-on room air this morning with O2 saturations in the high 80s.  Claims that he has less shortness of breath with exertion  (Interviewed patient with Science writer at bedside)  Assessment/Plan: Acute hypoxic respiratory failure due to multifocal pneumonia: Continues to improve-titrated to room air this morning-acknowledges less shortness of breath ambulation as well.  Repeat 1 more dose of IV Lasix today-on Rocephin/Zithromax.  Continue to taper down steroids.  May need home O2 on discharge.    HTN: Controlled-continue Lopressor-hold lisinopril as BP soft.  Newly diagnosed DM-2 (A1c 7.2 on 2/28) with uncontrolled hyperglycemia due to steroids: CBGs better controlled-steroids have been tapered down-continue Lantus 15 units daily, findings of NovoLog with meals, and SSI.  Follow and adjust.   Recent Labs    09/26/20 2040 09/27/20 0738 09/27/20 1154  GLUCAP 312* 186* 188*   Liver Cirrhoses: seen on CTA chest-hepatitis B/hepatitis C serology  negative-suspect EtOH related.  Stable for further work-up to be done in the outpatient setting by PCP.  Morbid Obesity: Estimated body mass index is 55.66 kg/m as calculated from the following:   Height as of this encounter: 5' (1.524 m).   Weight as of this encounter: 129.3 kg.   Diet: Diet Order            Diet Carb Modified Fluid consistency: Thin; Room service appropriate? No  Diet effective now                  Code Status: Full code   Family Communication: Bridget Campbell-[phone]-left a voicemail on 3/3  Disposition Plan: Status is: Inpatient  Remains inpatient appropriate because:Inpatient level of care appropriate due to severity of illness   Dispo: The patient is from: Home              Anticipated d/c is to: Home              Patient currently is not medically stable to d/c.   Difficult to place patient No    Barriers to Discharge: Pneumonia-with hypoxia-on supplemental O2  Antimicrobial agents: Anti-infectives (From admission, onward)   Start     Dose/Rate Route Frequency Ordered Stop   09/28/20 1000  azithromycin (ZITHROMAX) tablet 500 mg        500 mg Oral  Once 09/27/20 1319     09/26/20 1115  cefTRIAXone (ROCEPHIN) 2 g in sodium chloride 0.9 % 100 mL IVPB        2 g 200 mL/hr over 30 Minutes Intravenous Every 24 hours 09/25/20 1305 09/29/20 1114   09/24/20 1115  azithromycin (  ZITHROMAX) 500 mg in sodium chloride 0.9 % 250 mL IVPB  Status:  Discontinued        500 mg 250 mL/hr over 60 Minutes Intravenous Every 24 hours 09/24/20 1100 09/27/20 1319   09/24/20 1115  cefTRIAXone (ROCEPHIN) 1 g in sodium chloride 0.9 % 100 mL IVPB  Status:  Discontinued        1 g 200 mL/hr over 30 Minutes Intravenous Every 24 hours 09/24/20 1100 09/25/20 1305   09/24/20 1000  remdesivir 100 mg in sodium chloride 0.9 % 100 mL IVPB  Status:  Discontinued       "Followed by" Linked Group Details   100 mg 200 mL/hr over 30 Minutes Intravenous Daily 09/23/20 1728  09/24/20 1100   09/23/20 1830  remdesivir 200 mg in sodium chloride 0.9% 250 mL IVPB       "Followed by" Linked Group Details   200 mg 580 mL/hr over 30 Minutes Intravenous Once 09/23/20 1728 09/23/20 2045       Time spent: 25 minutes-Greater than 50% of this time was spent in counseling, explanation of diagnosis, planning of further management, and coordination of care.  MEDICATIONS: Scheduled Meds: . [START ON 09/28/2020] azithromycin  500 mg Oral Once  . enoxaparin (LOVENOX) injection  60 mg Subcutaneous Q24H  . folic acid  1 mg Oral Daily  . insulin aspart  0-15 Units Subcutaneous TID WC  . insulin aspart  0-5 Units Subcutaneous QHS  . insulin aspart  4 Units Subcutaneous TID WC  . insulin glargine  15 Units Subcutaneous QHS  . Ipratropium-Albuterol  1 puff Inhalation Q6H  . lidocaine  1 patch Transdermal Q24H  . metoprolol tartrate  25 mg Oral BID  . multivitamin with minerals  1 tablet Oral Daily  . predniSONE  30 mg Oral Daily  . thiamine  100 mg Oral Daily   Or  . thiamine  100 mg Intravenous Daily   Continuous Infusions: . sodium chloride 10 mL/hr at 09/25/20 1241  . cefTRIAXone (ROCEPHIN)  IV 2 g (09/27/20 1022)   PRN Meds:.acetaminophen, guaiFENesin-dextromethorphan   PHYSICAL EXAM: Vital signs: Vitals:   09/26/20 0732 09/26/20 2038 09/27/20 0436 09/27/20 1153  BP:  127/73 (!) 106/56 (!) 144/86  Pulse:  74 65 64  Resp:  16 18 20   Temp:  98.4 F (36.9 C) 98.1 F (36.7 C) (!) 97.5 F (36.4 C)  TempSrc:  Oral Oral Oral  SpO2: 92% 92% 90% 90%  Weight:      Height:       Filed Weights   09/23/20 1715  Weight: 129.3 kg   Body mass index is 55.66 kg/m.   Gen Exam:Alert awake-not in any distress HEENT:atraumatic, normocephalic Chest: B/L clear to auscultation anteriorly CVS:S1S2 regular Abdomen:soft non tender, non distended Extremities:no edema Neurology: Non focal Skin: no rash  I have personally reviewed following labs and imaging  studies  LABORATORY DATA: CBC: Recent Labs  Lab 09/23/20 1240 09/24/20 0335 09/25/20 0146 09/26/20 0041 09/27/20 0224  WBC 6.9 4.6 10.8* 10.1 7.2  NEUTROABS  --  3.4 9.2* 8.6* 5.6  HGB 13.8 13.2 12.3* 12.4* 12.2*  HCT 38.7* 39.6 34.4* 35.2* 34.6*  MCV 93.9 97.3 93.5 94.4 93.5  PLT 153 124* 122* 121* 116*    Basic Metabolic Panel: Recent Labs  Lab 09/23/20 1240 09/24/20 0335 09/25/20 0146 09/26/20 0041 09/27/20 0224  NA 131* 130* 133* 135 133*  K 3.8 4.3 4.2 3.6 3.8  CL 99 103 107 107 105  CO2 21* 19* 19* 21* 20*  GLUCOSE 300* 276* 248* 250* 236*  BUN 9 13 16 20  23*  CREATININE 0.74 0.71 0.70 0.85 0.73  CALCIUM 9.0 9.0 8.7* 8.6* 8.4*  MG  --  1.7 1.9 2.1  --   PHOS  --  2.7 3.5 3.4  --     GFR: Estimated Creatinine Clearance: 120.6 mL/min (by C-G formula based on SCr of 0.73 mg/dL).  Liver Function Tests: Recent Labs  Lab 09/23/20 1525 09/24/20 0335 09/25/20 0146 09/26/20 0041 09/27/20 0224  AST 54* 42* 29 28 31   ALT 26 25 21 21 29   ALKPHOS 176* 153* 129* 135* 127*  BILITOT 1.4* 1.4* 1.1 1.3* 1.0  PROT 8.1 8.0 7.5 7.2 6.6  ALBUMIN 2.5* 2.4* 2.2* 2.2* 2.2*   No results for input(s): LIPASE, AMYLASE in the last 168 hours. No results for input(s): AMMONIA in the last 168 hours.  Coagulation Profile: No results for input(s): INR, PROTIME in the last 168 hours.  Cardiac Enzymes: No results for input(s): CKTOTAL, CKMB, CKMBINDEX, TROPONINI in the last 168 hours.  BNP (last 3 results) No results for input(s): PROBNP in the last 8760 hours.  Lipid Profile: No results for input(s): CHOL, HDL, LDLCALC, TRIG, CHOLHDL, LDLDIRECT in the last 72 hours.  Thyroid Function Tests: No results for input(s): TSH, T4TOTAL, FREET4, T3FREE, THYROIDAB in the last 72 hours.  Anemia Panel: Recent Labs    09/25/20 0146 09/26/20 0041  FERRITIN 239 251    Urine analysis:    Component Value Date/Time   COLORURINE YELLOW 09/12/2019 1330   APPEARANCEUR CLEAR  09/12/2019 1330   LABSPEC 1.016 09/12/2019 1330   PHURINE 7.0 09/12/2019 1330   GLUCOSEU >=500 (A) 09/12/2019 1330   HGBUR NEGATIVE 09/12/2019 1330   BILIRUBINUR NEGATIVE 09/12/2019 1330   KETONESUR NEGATIVE 09/12/2019 1330   PROTEINUR NEGATIVE 09/12/2019 1330   NITRITE NEGATIVE 09/12/2019 1330   LEUKOCYTESUR NEGATIVE 09/12/2019 1330    Sepsis Labs: Lactic Acid, Venous    Component Value Date/Time   LATICACIDVEN 1.2 09/23/2020 2029    MICROBIOLOGY: Recent Results (from the past 240 hour(s))  Blood Culture (routine x 2)     Status: None (Preliminary result)   Collection Time: 09/23/20  4:27 PM   Specimen: BLOOD LEFT HAND  Result Value Ref Range Status   Specimen Description BLOOD LEFT HAND  Final   Special Requests   Final    BOTTLES DRAWN AEROBIC AND ANAEROBIC Blood Culture results may not be optimal due to an inadequate volume of blood received in culture bottles   Culture   Final    NO GROWTH 4 DAYS Performed at [facility], [address], [place], [facility] [name]    Report Status PENDING  Incomplete  Resp Panel by RT-PCR (Flu A&B, Covid) Nasopharyngeal Swab     Status: None   Collection Time: 09/23/20  8:14 PM   Specimen: Nasopharyngeal Swab; Nasopharyngeal(NP) swabs in vial transport medium  Result Value Ref Range Status   SARS Coronavirus 2 by RT PCR NEGATIVE NEGATIVE Final    Comment: (NOTE) SARS-CoV-2 target nucleic acids are NOT DETECTED.  The SARS-CoV-2 RNA is generally detectable in upper respiratory specimens during the acute phase of infection. The lowest concentration of SARS-CoV-2 viral copies this assay can detect is 138 copies/mL. A negative result does not preclude SARS-Cov-2 infection and should not be used as the sole basis for treatment or other patient management decisions. A negative result may occur with  improper specimen collection/handling, submission of specimen other than nasopharyngeal swab, presence of viral mutation(s) within  the areas targeted by this assay, and inadequate number of viral copies(<138 copies/mL). A negative result must be combined with clinical observations, patient history, and epidemiological information. The expected result is Negative.  Fact Sheet for Patients:  BloggerCourse.com  Fact Sheet for Healthcare Providers:  SeriousBroker.it  This test is no t yet approved or cleared by the [place] FDA and  has been authorized for detection and/or diagnosis of SARS-CoV-2 by FDA under an Emergency Use Authorization (EUA). This EUA will remain  in effect (meaning this test can be used) for the duration of the COVID-19 declaration under Section 564(b)(1) of the Act, 21 U.S.C.section 360bbb-3(b)(1), unless the authorization is terminated  or revoked sooner.       Influenza A by PCR NEGATIVE NEGATIVE Final   Influenza B by PCR NEGATIVE NEGATIVE Final    Comment: (NOTE) The Xpert Xpress SARS-CoV-2/FLU/RSV plus assay is intended as an aid in the diagnosis of influenza from Nasopharyngeal swab specimens and should not be used as a sole basis for treatment. Nasal washings and aspirates are unacceptable for Xpert Xpress SARS-CoV-2/FLU/RSV testing.  Fact Sheet for Patients: BloggerCourse.com  Fact Sheet for Healthcare Providers: SeriousBroker.it  This test is not yet approved or cleared by the [place] FDA and has been authorized for detection and/or diagnosis of SARS-CoV-2 by FDA under an Emergency Use Authorization (EUA). This EUA will remain in effect (meaning this test can be used) for the duration of the COVID-19 declaration under Section 564(b)(1) of the Act, 21 U.S.C. section 360bbb-3(b)(1), unless the authorization is terminated or revoked.  Performed at [facility], [address], [place], [place] [address]   Blood Culture (routine x 2)     Status: None  (Preliminary result)   Collection Time: 09/23/20  8:14 PM   Specimen: BLOOD  Result Value Ref Range Status   Specimen Description BLOOD RIGHT FOREARM  Final   Special Requests   Final    BOTTLES DRAWN AEROBIC AND ANAEROBIC Blood Culture adequate volume   Culture   Final    NO GROWTH 4 DAYS Performed at [facility], [address] [place], [place] [address]    Report Status PENDING  Incomplete  Respiratory (~20 pathogens) panel by PCR     Status: None   Collection Time: 09/25/20 12:27 PM   Specimen: Nasopharyngeal Swab; Respiratory  Result Value Ref Range Status   Adenovirus NOT DETECTED NOT DETECTED Final   Coronavirus 229E NOT DETECTED NOT DETECTED Final    Comment: (NOTE) The Coronavirus on the Respiratory Panel, DOES NOT test for the novel  Coronavirus (2019 nCoV)    Coronavirus HKU1 NOT DETECTED NOT DETECTED Final   Coronavirus NL63 NOT DETECTED NOT DETECTED Final   Coronavirus OC43 NOT DETECTED NOT DETECTED Final   Metapneumovirus NOT DETECTED NOT DETECTED Final   Rhinovirus / Enterovirus NOT DETECTED NOT DETECTED Final   Influenza A NOT DETECTED NOT DETECTED Final   Influenza B NOT DETECTED NOT DETECTED Final   Parainfluenza Virus 1 NOT DETECTED NOT DETECTED Final   Parainfluenza Virus 2 NOT DETECTED NOT DETECTED Final   Parainfluenza Virus 3 NOT DETECTED NOT DETECTED Final   Parainfluenza Virus 4 NOT DETECTED NOT DETECTED Final   Respiratory Syncytial Virus NOT DETECTED NOT DETECTED Final   Bordetella pertussis NOT DETECTED NOT DETECTED Final   Bordetella Parapertussis NOT DETECTED NOT DETECTED Final   Chlamydophila  pneumoniae NOT DETECTED NOT DETECTED Final   Mycoplasma pneumoniae NOT DETECTED NOT DETECTED Final    Comment: Performed at [facility], [address] [place], [place] [address]  MRSA PCR Screening     Status: None   Collection Time: 09/26/20  7:16 AM   Specimen: Nasopharyngeal  Result Value Ref Range Status   MRSA by PCR NEGATIVE  NEGATIVE Final    Comment:        The GeneXpert MRSA Assay (FDA approved for NASAL specimens only), is one component of a comprehensive MRSA colonization surveillance program. It is not intended to diagnose MRSA infection nor to guide or monitor treatment for MRSA infections. Performed at [facility], [address] [place], [place] [address]     RADIOLOGY STUDIES/RESULTS: No results found.   LOS: 4 days   Jeoffrey MassedShanker Iva Posten, MD  [facility]    To contact the attending provider between 7A-7P or the covering provider during after hours 7P-7A, please log into the web site [email or web address] and access using universal [place] password for that web site. If you do not have the password, please call the hospital operator.  09/27/2020, 2:35 PM

## 2020-09-27 NOTE — Progress Notes (Signed)
SATURATION QUALIFICATIONS: (This note is used to comply with regulatory documentation for home oxygen)  Patient Saturations on Room Air at Rest = 93%  Patient Saturations on Room Air while Ambulating = 89% 

## 2020-09-27 NOTE — Progress Notes (Signed)
PHARMACIST - PHYSICIAN COMMUNICATION DR:   Ghimire CONCERNING: Antibiotic IV to Oral Route Change Policy  RECOMMENDATION: This patient is receiving azithromycin by the intravenous route.  Based on criteria approved by the Pharmacy and Therapeutics Committee, the antibiotic(s) is/are being converted to the equivalent oral dose form(s).   DESCRIPTION: These criteria include: Patient being treated for a respiratory tract infection, urinary tract infection, cellulitis or clostridium difficile associated diarrhea if on metronidazole The patient is not neutropenic and does not exhibit a GI malabsorption state The patient is eating (either orally or via tube) and/or has been taking other orally administered medications for a least 24 hours The patient is improving clinically and has a Tmax < 100.5  If you have questions about this conversion, please contact the Pharmacy Department  []  ( [phone]   []  ( [phone]  [facility] [x]  ( [phone]  [facility] []  ( [phone] )  [facility] []  ( [phone] )  [facility]   

## 2020-09-28 ENCOUNTER — Other Ambulatory Visit: Payer: Self-pay | Admitting: Internal Medicine

## 2020-09-28 ENCOUNTER — Encounter (HOSPITAL_COMMUNITY): Payer: Self-pay | Admitting: Internal Medicine

## 2020-09-28 LAB — CBC WITH DIFFERENTIAL/PLATELET
Abs Immature Granulocytes: 0.07 10*3/uL (ref 0.00–0.07)
Basophils Absolute: 0 10*3/uL (ref 0.0–0.1)
Basophils Relative: 0 %
Eosinophils Absolute: 0.2 10*3/uL (ref 0.0–0.5)
Eosinophils Relative: 2 %
HCT: 36.6 % — ABNORMAL LOW (ref 39.0–52.0)
Hemoglobin: 12.9 g/dL — ABNORMAL LOW (ref 13.0–17.0)
Immature Granulocytes: 1 %
Lymphocytes Relative: 40 %
Lymphs Abs: 3.4 10*3/uL (ref 0.7–4.0)
MCH: 32.8 pg (ref 26.0–34.0)
MCHC: 35.2 g/dL (ref 30.0–36.0)
MCV: 93.1 fL (ref 80.0–100.0)
Monocytes Absolute: 0.9 10*3/uL (ref 0.1–1.0)
Monocytes Relative: 11 %
Neutro Abs: 4 10*3/uL (ref 1.7–7.7)
Neutrophils Relative %: 46 %
Platelets: 112 10*3/uL — ABNORMAL LOW (ref 150–400)
RBC: 3.93 MIL/uL — ABNORMAL LOW (ref 4.22–5.81)
RDW: 13.2 % (ref 11.5–15.5)
WBC: 8.5 10*3/uL (ref 4.0–10.5)
nRBC: 0 % (ref 0.0–0.2)

## 2020-09-28 LAB — COMPREHENSIVE METABOLIC PANEL
ALT: 32 U/L (ref 0–44)
AST: 38 U/L (ref 15–41)
Albumin: 2.2 g/dL — ABNORMAL LOW (ref 3.5–5.0)
Alkaline Phosphatase: 125 U/L (ref 38–126)
Anion gap: 6 (ref 5–15)
BUN: 15 mg/dL (ref 6–20)
CO2: 23 mmol/L (ref 22–32)
Calcium: 8.5 mg/dL — ABNORMAL LOW (ref 8.9–10.3)
Chloride: 105 mmol/L (ref 98–111)
Creatinine, Ser: 0.64 mg/dL (ref 0.61–1.24)
GFR, Estimated: >60 mL/min (ref >60)
Glucose, Bld: 107 mg/dL — ABNORMAL HIGH (ref 70–99)
Potassium: 3.4 mmol/L — ABNORMAL LOW (ref 3.5–5.1)
Sodium: 134 mmol/L — ABNORMAL LOW (ref 135–145)
Total Bilirubin: 1.3 mg/dL — ABNORMAL HIGH (ref 0.3–1.2)
Total Protein: 6.5 g/dL (ref 6.5–8.1)

## 2020-09-28 LAB — CULTURE, BLOOD (ROUTINE X 2)
Culture: NO GROWTH
Culture: NO GROWTH
Special Requests: ADEQUATE

## 2020-09-28 LAB — C-REACTIVE PROTEIN: CRP: 0.5 mg/dL (ref <1.0)

## 2020-09-28 LAB — GLUCOSE, CAPILLARY: Glucose-Capillary: 89 mg/dL (ref 70–99)

## 2020-09-28 LAB — D-DIMER, QUANTITATIVE: D-Dimer, Quant: 0.65 ug/mL-FEU — ABNORMAL HIGH (ref 0.00–0.50)

## 2020-09-28 MED ORDER — PREDNISONE 20 MG PO TABS
20.0000 mg | ORAL_TABLET | Freq: Every day | ORAL | Status: DC
  Administered 2020-09-28: 20 mg via ORAL
  Filled 2020-09-28: qty 1

## 2020-09-28 MED ORDER — POTASSIUM CHLORIDE CRYS ER 20 MEQ PO TBCR
40.0000 meq | EXTENDED_RELEASE_TABLET | Freq: Once | ORAL | Status: AC
  Administered 2020-09-28: 40 meq via ORAL
  Filled 2020-09-28: qty 2

## 2020-09-28 MED ORDER — BLOOD GLUCOSE METER KIT: PACK | 0 refills | Status: DC

## 2020-09-28 MED ORDER — IPRATROPIUM-ALBUTEROL 20-100 MCG/ACT IN AERS
1.0000 | INHALATION_SPRAY | Freq: Four times a day (QID) | RESPIRATORY_TRACT | Status: DC | PRN
  Filled 2020-09-28: qty 4

## 2020-09-28 NOTE — Progress Notes (Signed)
Went over d/c directions with the help of a Spanish interpreter.  Patient verbalized an understanding.  Patient is ready for d/c, awaiting Taxi.

## 2020-09-28 NOTE — Plan of Care (Signed)
  Problem: Education: Goal: Knowledge of General Education information will improve Description: Including pain rating scale, medication(s)/side effects and non-pharmacologic comfort measures Outcome: Progressing   Problem: Health Behavior/Discharge Planning: Goal: Ability to manage health-related needs will improve Outcome: Progressing   Problem: Clinical Measurements: Goal: Ability to maintain clinical measurements within normal limits will improve Outcome: Progressing Goal: Will remain free from infection Outcome: Progressing  No significant events took place overnight. Pt vital signs remained stable. Will continue to monitor.

## 2020-09-28 NOTE — Discharge Summary (Signed)
PATIENT DETAILS Name: Ian Pham Age: 55 y.o. Sex: male Date of Birth: [birth year] MRN: [id number]. Admitting Physician: Lequita Halt, MD PPJ:[id number], No Pcp Per  Admit Date: 09/23/2020 Discharge date: 09/28/2020  Recommendations for Outpatient Follow-up:  1. Follow up with PCP in 1-2 weeks 2. Please obtain CMP/CBC in one week 3. Repeat two-view chest x-ray in 4 to 6 weeks to document resolution of pneumonia 4. Optimize DM-2 medication/control 5. Needs further outpatient work-up/GI referral for radiological evidence of cirrhosis 6. Continue counseling regarding importance of avoiding alcohol  Admitted From:  Home  Disposition: [place]: No  Equipment/Devices: None  Discharge Condition: Stable  CODE STATUS: FULL CODE  Diet recommendation:  Diet Order            Diet - low sodium heart healthy           Diet Carb Modified Fluid consistency: Thin; Room service appropriate? No  Diet effective now                 Brief Narrative: Patient is a 55 y.o. male no past medical history-claims he had COVID-19 infection approximately 4 weeks before this hospital stay (numerous family members with similar symptoms) presented to the hospital with 2-week history of exertional dyspnea.  Significant events: 2/28>> admit with SOB-multifocal pneumonia on chest x-ray  Significant studies: 2/28>>CTA Chest: no PE, extensive ground glass opacities  Antimicrobial therapy: None  Microbiology data: 2/28>> microbiology: No growth 2/28>>Influenza/Covid PCR:neg 3/1>>Covid PCR:neg 3/2>>Resp virus panel: Negative  Procedures : None  Consults: None  Brief Hospital Course: Acute hypoxic respiratory failure due to multifocal pneumonia:  Has significantly improved-was requiring approximately 5 L of oxygen when he first came in-has been titrated down to room air.  Was treated with a combination of Rocephin/Zithromax and tapering steroids.  Did  also receive as needed doses of Lasix.  He acknowledges significant clinical improvement-minimal shortness of breath with exertion-significantly better than how he first presented to the hospital.  He was ambulated by the RN staff-does not require home O2.  Continue antibiotics for a few more days.  HTN: Controlled-continue Lopressor on discharge.  PCP/primary MD to consider adding ACE inhibitor at next visit.  Newly diagnosed DM-2 (A1c 7.2 on 2/28) with uncontrolled hyperglycemia due to steroids:  Was on SQ Lantus/SSI during this hospital stay-transitioning to Metformin on discharge.  Follow with PCP for further optimization.   Liver Cirrhoses: seen on CTA chest-hepatitis B/hepatitis C serology negative-suspect EtOH related.  Stable for further work-up to be done in the outpatient setting by PCP.  Has been counseled regarding importance of complete abstinence from alcohol.  EtOH use: Claims that he has been drinking 1-2 cans of beer daily on a regular basis.  Not sure how accurate his history is.  Counseled extensively.  Morbid Obesity: Estimated body mass index is 55.66 kg/m as calculated from the following:   Height as of this encounter: 5' (1.524 m).   Weight as of this encounter: 129.3 kg.   Discharge Diagnoses:  Active Problems:   PNA (pneumonia)   COVID-19   Discharge Instructions:  Activity:  As tolerated Discharge Instructions    Diet - low sodium heart healthy   Complete by: As directed    Discharge instructions   Complete by: As directed    Follow with Primary  in 1-2 weeks  STOP DRINKING ETOH/ALCOHOL  Please get a complete blood count and chemistry panel checked by your Primary MD at your next  visit, and again as instructed by your Primary MD.  Get Medicines reviewed and adjusted: Please take all your medications with you for your next visit with your Primary MD  Laboratory/radiological data: Please request your Primary MD to go over all hospital tests and  procedure/radiological results at the follow up, please ask your Primary MD to get all Hospital records sent to his/her office.  In some cases, they will be blood work, cultures and biopsy results pending at the time of your discharge. Please request that your primary care M.D. follows up on these results.  Also Note the following: If you experience worsening of your admission symptoms, develop shortness of breath, life threatening emergency, suicidal or homicidal thoughts you must seek medical attention immediately by calling 911 or calling your MD immediately  if symptoms less severe.  You must read complete instructions/literature along with all the possible adverse reactions/side effects for all the Medicines you take and that have been prescribed to you. Take any new Medicines after you have completely understood and accpet all the possible adverse reactions/side effects.   Do not drive when taking Pain medications or sleeping medications (Benzodaizepines)  Do not take more than prescribed Pain, Sleep and Anxiety Medications. It is not advisable to combine anxiety,sleep and pain medications without talking with your primary care practitioner  Special Instructions: If you have smoked or chewed Tobacco  in the last 2 yrs please stop smoking, stop any regular Alcohol  and or any Recreational drug use.  Wear Seat belts while driving.  Please note: You were cared for by a hospitalist during your hospital stay. Once you are discharged, your primary care physician will handle any further medical issues. Please note that NO REFILLS for any discharge medications will be authorized once you are discharged, as it is imperative that you return to your primary care physician (or establish a relationship with a primary care physician if you do not have one) for your post hospital discharge needs so that they can reassess your need for medications and monitor your lab values.   Stop all alcohol use  You  have radiological evidence of cirrhosis-please ask your primary care practitioner to initiate further work-up and suggest a outpatient GI referral  You have diabetes-please take metformin-keep a record of your CBGs-and take it to your next appointment with your PCP  Please ask your primary care practitioner to repeat a two-view chest x-ray in 4 to 6 weeks.   Increase activity slowly   Complete by: As directed      Allergies as of 09/28/2020   No Known Allergies     Medication List    STOP taking these medications   naproxen 500 MG tablet Commonly known as: Naprosyn     TAKE these medications   amoxicillin-clavulanate 875-125 MG tablet Commonly known as: Augmentin Take 1 tablet by mouth 2 (two) times daily for 3 days.   blood glucose meter kit and supplies Dispense based on patient and insurance preference. Use up to four times daily as directed. (FOR ICD-10 E10.9, E11.9).   metFORMIN 500 MG tablet Commonly known as: Glucophage Take 1 tablet (500 mg total) by mouth 2 (two) times daily with a meal.   metoprolol tartrate 25 MG tablet Commonly known as: LOPRESSOR Take 1 tablet (25 mg total) by mouth 2 (two) times daily.       Follow-up Information    [place]. Go on 10/21/2020.   Why: 2:15pm with Geryl Rankins.  Vaya a esta cita para establecer un proveedor de atencin primaria. Sheela Stack algn tipo de identificacin y Judene Companion facial. Habr intrprete disponible. Luego puede usar su farmacia para Physicist, medical. Contact information: [place] [phone]             No Known Allergies   Other Procedures/Studies: DG Chest 2 View  Result Date: 09/23/2020 CLINICAL DATA:  Fever, chest pain and shortness of breath for 1 week EXAM: CHEST - 2 VIEW COMPARISON:  None FINDINGS: Borderline enlargement of cardiac silhouette. Mediastinal contours normal. Patchy BILATERAL pulmonary  infiltrates consistent with multifocal pneumonia. No pleural effusion or pneumothorax. IMPRESSION: Patchy BILATERAL pulmonary infiltrates favor multifocal pneumonia. Electronically Signed   By: Lavonia Dana M.D.   On: 09/23/2020 13:06   CT Angio Chest PE W and/or Wo Contrast  Result Date: 09/23/2020 CLINICAL DATA:  Evaluate for acute pulmonary embolus. Short of breath. EXAM: CT ANGIOGRAPHY CHEST WITH CONTRAST TECHNIQUE: Multidetector CT imaging of the chest was performed using the standard protocol during bolus administration of intravenous contrast. Multiplanar CT image reconstructions and MIPs were obtained to evaluate the vascular anatomy. CONTRAST:  36m OMNIPAQUE IOHEXOL 350 MG/ML SOLN COMPARISON:  None. FINDINGS: Cardiovascular: The main pulmonary artery is patent. No central obstructing pulmonary embolus. Respiratory motion artifact results in image degradation at the level of the lower lobe segmental pulmonary arteries. Within this limitation there are no signs of lobar or segmental pulmonary artery embolus. Normal heart size. No pericardial effusion. Mediastinum/Nodes: Normal appearance of the thyroid gland. The trachea appears patent and is midline. Normal appearance of the esophagus. No enlarged axillary or supraclavicular lymph nodes. Prominent calcified and noncalcified mediastinal lymph nodes are identified. None of these meet CT criteria for adenopathy. There also prominent bilateral hilar nodes measuring up to 1 cm. Lungs/Pleura: No pleural effusion. Extensive bilateral ground-glass opacities with scattered areas of consolidation within both upper and lower lung zones. Upper Abdomen: No acute findings within the upper abdomen. Contour of the liver is slightly irregular. Relative hypertrophy of the lateral segment of left hepatic lobe and caudate lobe noted. Musculoskeletal: No chest wall abnormality. No acute or significant osseous findings. Review of the MIP images confirms the above findings.  IMPRESSION: 1. Respiratory motion artifact results in image degradation at the level of the lower lobe segmental pulmonary arteries. Within this limitation there are no signs of lobar or segmental pulmonary artery embolus. 2. Extensive ground-glass opacities with scattered areas of consolidation noted throughout both lungs. Imaging findings concerning for multifocal pneumonia. Atypical viral infection not excluded. 3. Prominent mediastinal and hilar lymph nodes are likely reactive. 4. Morphologic features of the liver suggestive of early cirrhosis. Electronically Signed   By: TKerby MoorsM.D.   On: 09/23/2020 15:44     TODAY-DAY OF DISCHARGE:  Subjective:   SScot Shiraishitoday has no headache,no chest abdominal pain,no new weakness tingling or numbness, feels much better wants to go home today.   Objective:   Blood pressure 111/71, pulse 63, temperature 98.4 F (36.9 C), temperature source Oral, resp. rate 20, height 5' (1.524 m), weight 99.8 kg, SpO2 92 %.  Intake/Output Summary (Last 24 hours) at 09/28/2020 1010 Last data filed at 09/28/2020 0900 Gross per 24 hour  Intake 440 ml  Output 3600 ml  Net -3160 ml   Filed Weights   09/23/20 1715 09/28/20 0439  Weight: 129.3 kg 99.8 kg    Exam: Awake Alert, Oriented *3, No new F.N deficits,  Normal affect [place].AT,PERRAL Supple Neck,No JVD, No cervical lymphadenopathy appriciated.  Symmetrical Chest wall movement, Good air movement bilaterally, CTAB RRR,No Gallops,Rubs or new Murmurs, No Parasternal Heave +ve B.Sounds, Abd Soft, Non tender, No organomegaly appriciated, No rebound -guarding or rigidity. No Cyanosis, Clubbing or edema, No new Rash or bruise   PERTINENT RADIOLOGIC STUDIES: No results found.   PERTINENT LAB RESULTS: CBC: Recent Labs    09/27/20 0224 09/28/20 0321  WBC 7.2 8.5  HGB 12.2* 12.9*  HCT 34.6* 36.6*  PLT 116* 112*   CMET CMP     Component Value Date/Time   NA 134 (L) 09/28/2020 0321   K  3.4 (L) 09/28/2020 0321   CL 105 09/28/2020 0321   CO2 23 09/28/2020 0321   GLUCOSE 107 (H) 09/28/2020 0321   BUN 15 09/28/2020 0321   CREATININE 0.64 09/28/2020 0321   CALCIUM 8.5 (L) 09/28/2020 0321   PROT 6.5 09/28/2020 0321   ALBUMIN 2.2 (L) 09/28/2020 0321   AST 38 09/28/2020 0321   ALT 32 09/28/2020 0321   ALKPHOS 125 09/28/2020 0321   BILITOT 1.3 (H) 09/28/2020 0321   GFRNONAA >60 09/28/2020 0321   GFRAA >60 09/12/2019 1253    GFR Estimated Creatinine Clearance: 103.2 mL/min (by C-G formula based on SCr of 0.64 mg/dL). No results for input(s): LIPASE, AMYLASE in the last 72 hours. No results for input(s): CKTOTAL, CKMB, CKMBINDEX, TROPONINI in the last 72 hours. Invalid input(s): POCBNP Recent Labs    09/27/20 0224 09/28/20 0321  DDIMER 0.83* 0.65*   No results for input(s): HGBA1C in the last 72 hours. No results for input(s): CHOL, HDL, LDLCALC, TRIG, CHOLHDL, LDLDIRECT in the last 72 hours. No results for input(s): TSH, T4TOTAL, T3FREE, THYROIDAB in the last 72 hours.  Invalid input(s): FREET3 Recent Labs    09/26/20 0041  FERRITIN 251   Coags: No results for input(s): INR in the last 72 hours.  Invalid input(s): PT Microbiology: Recent Results (from the past 240 hour(s))  Blood Culture (routine x 2)     Status: None   Collection Time: 09/23/20  4:27 PM   Specimen: BLOOD LEFT HAND  Result Value Ref Range Status   Specimen Description BLOOD LEFT HAND  Final   Special Requests   Final    BOTTLES DRAWN AEROBIC AND ANAEROBIC Blood Culture results may not be optimal due to an inadequate volume of blood received in culture bottles   Culture   Final    NO GROWTH 5 DAYS Performed at [facility], [place] [address], [place], [place] [address]    Report Status 09/28/2020 FINAL  Final  Resp Panel by RT-PCR (Flu A&B, Covid) Nasopharyngeal Swab     Status: None   Collection Time: 09/23/20  8:14 PM   Specimen: Nasopharyngeal Swab; Nasopharyngeal(NP) swabs in  vial transport medium  Result Value Ref Range Status   SARS Coronavirus 2 by RT PCR NEGATIVE NEGATIVE Final    Comment: (NOTE) SARS-CoV-2 target nucleic acids are NOT DETECTED.  The SARS-CoV-2 RNA is generally detectable in upper respiratory specimens during the acute phase of infection. The lowest concentration of SARS-CoV-2 viral copies this assay can detect is 138 copies/mL. A negative result does not preclude SARS-Cov-2 infection and should not be used as the sole basis for treatment or other patient management decisions. A negative result may occur with  improper specimen collection/handling, submission of specimen other than nasopharyngeal swab, presence of viral mutation(s) within the areas targeted by this assay, and  inadequate number of viral copies(<138 copies/mL). A negative result must be combined with clinical observations, patient history, and epidemiological information. The expected result is Negative.  Fact Sheet for Patients:  EntrepreneurPulse.com.au  Fact Sheet for Healthcare Providers:  IncredibleEmployment.be  This test is no t yet approved or cleared by the [place] FDA and  has been authorized for detection and/or diagnosis of SARS-CoV-2 by FDA under an Emergency Use Authorization (EUA). This EUA will remain  in effect (meaning this test can be used) for the duration of the COVID-19 declaration under Section 564(b)(1) of the Act, 21 U.S.C.section 360bbb-3(b)(1), unless the authorization is terminated  or revoked sooner.       Influenza A by PCR NEGATIVE NEGATIVE Final   Influenza B by PCR NEGATIVE NEGATIVE Final    Comment: (NOTE) The Xpert Xpress SARS-CoV-2/FLU/RSV plus assay is intended as an aid in the diagnosis of influenza from Nasopharyngeal swab specimens and should not be used as a sole basis for treatment. Nasal washings and aspirates are unacceptable for Xpert Xpress SARS-CoV-2/FLU/RSV testing.  Fact  Sheet for Patients: EntrepreneurPulse.com.au  Fact Sheet for Healthcare Providers: IncredibleEmployment.be  This test is not yet approved or cleared by the [place] FDA and has been authorized for detection and/or diagnosis of SARS-CoV-2 by FDA under an Emergency Use Authorization (EUA). This EUA will remain in effect (meaning this test can be used) for the duration of the COVID-19 declaration under Section 564(b)(1) of the Act, 21 U.S.C. section 360bbb-3(b)(1), unless the authorization is terminated or revoked.  Performed at [facility], [place] [address], [place], [place] [address]   Blood Culture (routine x 2)     Status: None   Collection Time: 09/23/20  8:14 PM   Specimen: BLOOD  Result Value Ref Range Status   Specimen Description BLOOD RIGHT FOREARM  Final   Special Requests   Final    BOTTLES DRAWN AEROBIC AND ANAEROBIC Blood Culture adequate volume   Culture   Final    NO GROWTH 5 DAYS Performed at [facility], [place] [address] [place], [place] [address]    Report Status 09/28/2020 FINAL  Final  Respiratory (~20 pathogens) panel by PCR     Status: None   Collection Time: 09/25/20 12:27 PM   Specimen: Nasopharyngeal Swab; Respiratory  Result Value Ref Range Status   Adenovirus NOT DETECTED NOT DETECTED Final   Coronavirus 229E NOT DETECTED NOT DETECTED Final    Comment: (NOTE) The Coronavirus on the Respiratory Panel, DOES NOT test for the novel  Coronavirus (2019 nCoV)    Coronavirus HKU1 NOT DETECTED NOT DETECTED Final   Coronavirus NL63 NOT DETECTED NOT DETECTED Final   Coronavirus OC43 NOT DETECTED NOT DETECTED Final   Metapneumovirus NOT DETECTED NOT DETECTED Final   Rhinovirus / Enterovirus NOT DETECTED NOT DETECTED Final   Influenza A NOT DETECTED NOT DETECTED Final   Influenza B NOT DETECTED NOT DETECTED Final   Parainfluenza Virus 1 NOT DETECTED NOT DETECTED Final   Parainfluenza Virus 2 NOT DETECTED NOT  DETECTED Final   Parainfluenza Virus 3 NOT DETECTED NOT DETECTED Final   Parainfluenza Virus 4 NOT DETECTED NOT DETECTED Final   Respiratory Syncytial Virus NOT DETECTED NOT DETECTED Final   Bordetella pertussis NOT DETECTED NOT DETECTED Final   Bordetella Parapertussis NOT DETECTED NOT DETECTED Final   Chlamydophila pneumoniae NOT DETECTED NOT DETECTED Final   Mycoplasma pneumoniae NOT DETECTED NOT DETECTED Final    Comment: Performed at [facility], [place] [address] [place], [place] [address]  MRSA PCR Screening     Status: None   Collection Time: 09/26/20  7:16 AM   Specimen: Nasopharyngeal  Result Value Ref Range Status   MRSA by PCR NEGATIVE NEGATIVE Final    Comment:        The GeneXpert MRSA Assay (FDA approved for NASAL specimens only), is one component of a comprehensive MRSA colonization surveillance program. It is not intended to diagnose MRSA infection nor to guide or monitor treatment for MRSA infections. Performed at [facility], [place] [address] [place], [place] [address]     FURTHER DISCHARGE INSTRUCTIONS:  Get Medicines reviewed and adjusted: Please take all your medications with you for your next visit with your Primary MD  Laboratory/radiological data: Please request your Primary MD to go over all hospital tests and procedure/radiological results at the follow up, please ask your Primary MD to get all Hospital records sent to his/her office.  In some cases, they will be blood work, cultures and biopsy results pending at the time of your discharge. Please request that your primary care M.D. goes through all the records of your hospital data and follows up on these results.  Also Note the following: If you experience worsening of your admission symptoms, develop shortness of breath, life threatening emergency, suicidal or homicidal thoughts you must seek medical attention immediately by calling 911 or calling your MD immediately  if symptoms less  severe.  You must read complete instructions/literature along with all the possible adverse reactions/side effects for all the Medicines you take and that have been prescribed to you. Take any new Medicines after you have completely understood and accpet all the possible adverse reactions/side effects.   Do not drive when taking Pain medications or sleeping medications (Benzodaizepines)  Do not take more than prescribed Pain, Sleep and Anxiety Medications. It is not advisable to combine anxiety,sleep and pain medications without talking with your primary care practitioner  Special Instructions: If you have smoked or chewed Tobacco  in the last 2 yrs please stop smoking, stop any regular Alcohol  and or any Recreational drug use.  Wear Seat belts while driving.  Please note: You were cared for by a hospitalist during your hospital stay. Once you are discharged, your primary care physician will handle any further medical issues. Please note that NO REFILLS for any discharge medications will be authorized once you are discharged, as it is imperative that you return to your primary care physician (or establish a relationship with a primary care physician if you do not have one) for your post hospital discharge needs so that they can reassess your need for medications and monitor your lab values.  Total Time spent coordinating discharge including counseling, education and face to face time equals 35 minutes.  Signed: Shanker Ghimire 09/28/2020 10:10 AM

## 2020-09-28 NOTE — Plan of Care (Signed)
  Problem: Education: Goal: Knowledge of General Education information will improve Description: Including pain rating scale, medication(s)/side effects and non-pharmacologic comfort measures Outcome: Adequate for Discharge   Problem: Health Behavior/Discharge Planning: Goal: Ability to manage health-related needs will improve Outcome: Adequate for Discharge   Problem: Clinical Measurements: Goal: Ability to maintain clinical measurements within normal limits will improve Outcome: Adequate for Discharge Goal: Will remain free from infection Outcome: Adequate for Discharge   

## 2020-10-14 MED FILL — TRUE METRIX GLUCOSE TEST ST: 25 days supply | Qty: 100 | Fill #0

## 2020-10-14 MED FILL — TRUEplus LANCETS 28G MISC: 25 days supply | Qty: 100 | Fill #0

## 2020-10-14 MED FILL — !TRUE METRIX BLOOD GLUCOSE: 30 days supply | Qty: 1 | Fill #0

## 2020-10-21 ENCOUNTER — Ambulatory Visit: Payer: Self-pay | Attending: Nurse Practitioner | Admitting: Nurse Practitioner

## 2020-10-21 ENCOUNTER — Other Ambulatory Visit: Payer: Self-pay | Admitting: Nurse Practitioner

## 2020-10-21 ENCOUNTER — Encounter (HOSPITAL_COMMUNITY): Payer: Self-pay

## 2020-10-21 ENCOUNTER — Encounter: Payer: Self-pay | Admitting: Nurse Practitioner

## 2020-10-21 DIAGNOSIS — I10 Essential (primary) hypertension: Secondary | ICD-10-CM

## 2020-10-21 DIAGNOSIS — J1282 Pneumonia due to coronavirus disease 2019: Secondary | ICD-10-CM

## 2020-10-21 DIAGNOSIS — Z7689 Persons encountering health services in other specified circumstances: Secondary | ICD-10-CM

## 2020-10-21 DIAGNOSIS — D649 Anemia, unspecified: Secondary | ICD-10-CM

## 2020-10-21 DIAGNOSIS — U071 COVID-19: Secondary | ICD-10-CM

## 2020-10-21 DIAGNOSIS — E1165 Type 2 diabetes mellitus with hyperglycemia: Secondary | ICD-10-CM

## 2020-10-21 MED ORDER — TRUE METRIX BLOOD GLUCOSE TEST VI STRP: ORAL_STRIP | 12 refills | Status: DC

## 2020-10-21 MED ORDER — TRUE METRIX METER W/DEVICE KIT: PACK | 0 refills | Status: DC

## 2020-10-21 MED ORDER — METFORMIN HCL 500 MG PO TABS: 500.0000 mg | ORAL_TABLET | Freq: Two times a day (BID) | ORAL | 3 refills | Status: DC

## 2020-10-21 MED ORDER — METOPROLOL TARTRATE 25 MG PO TABS: 25.0000 mg | ORAL_TABLET | Freq: Two times a day (BID) | ORAL | 3 refills | Status: DC

## 2020-10-21 MED ORDER — TRUEPLUS LANCETS 28G MISC: 3 refills | Status: DC

## 2020-10-21 MED FILL — TRUEplus LANCETS 28G MISC: 50 days supply | Qty: 100 | Fill #0

## 2020-10-21 MED FILL — TRUE METRIX GLUCOSE TEST ST: 50 days supply | Qty: 100 | Fill #0

## 2020-10-21 MED FILL — TRUE METRIX BLOOD GLUCOSE M: W/DEVICE | 1 days supply | Qty: 1 | Fill #0

## 2020-10-21 MED FILL — METOPROLOL TARTRATE 25 MG T: 25 | 30 days supply | Qty: 60 | Fill #0

## 2020-10-21 MED FILL — METFORMIN HCL 500 MG TABS: 500 | 30 days supply | Qty: 60 | Fill #0

## 2020-10-21 NOTE — Progress Notes (Signed)
Virtual Visit via Telephone Note Due to national recommendations of social distancing due to [place] 19, telehealth visit is felt to be most appropriate for this patient at this time.  I discussed the limitations, risks, security and privacy concerns of performing an evaluation and management service by telephone and the availability of in person appointments. I also discussed with the patient that there may be a patient responsible charge related to this service. The patient expressed understanding and agreed to proceed.    I connected with Ian Pham on 10/21/20  at   2:30 PM EDT  EDT by telephone and verified that I am speaking with the correct person using two identifiers.   Consent I discussed the limitations, risks, security and privacy concerns of performing an evaluation and management service by telephone and the availability of in person appointments. I also discussed with the patient that there may be a patient responsible charge related to this service. The patient expressed understanding and agreed to proceed.   Location of Patient: Private Residence   Location of Provider: [place] [facility] [place] Office    Persons participating in Telemedicine visit: Ian Rankins FNP-BC [facility]  Spanish ID Interpreter ID# [id number]   History of Present Illness: Telemedicine visit for: Establish Care and HFU He has a past medical history of DM2, HTN and HPL.   Admitted for 6 days with acute hypoxic respiratory failure due to multifocal PNA. He was treated with IV abx and steroids. Hospital course complicated by HTN and newly diagnosed DM. He was started on metoprolol and newly diagnosed DM. He was transitioned to Metformin on discharge. Due to a long history of ETOH abuse CTA chest also revealed cirrhosis. He will need to see GI for this.  Needs repeat CXR in a few weeks for resolution of PNA. BP Readings from Last 3  Encounters:  09/28/20 111/71  09/01/20 (!) 145/75  09/12/19 132/78   He endorses adherence taking metformin 500 mg BID and lopressor 25 mg BID. Denies chest pain, shortness of breath, palpitations, lightheadedness, dizziness, headaches or BLE edema. LDL not at goal. Will need statin added.  Lab Results  Component Value Date   HGBA1C 7.2 (H) 09/23/2020   Lab Results  Component Value Date   LDLCALC 88 09/23/2020   Past Medical History:  Diagnosis Date  . Diabetes mellitus without complication ([place])   . Hypertension     Past Surgical History:  Procedure Laterality Date  . no past surgery      History reviewed. No pertinent family history.  Social History   Socioeconomic History  . Marital status: Single    Spouse name: Not on file  . Number of children: Not on file  . Years of education: Not on file  . Highest education level: Not on file  Occupational History  . Not on file  Tobacco Use  . Smoking status: Former Research scientist (life sciences)  . Smokeless tobacco: Former Network engineer and Sexual Activity  . Alcohol use: Yes    Alcohol/week: 2.0 standard drinks    Types: 2 Cans of beer per week  . Drug use: Never  . Sexual activity: Yes  Other Topics Concern  . Not on file  Social History Narrative   ** Merged History Encounter **       Social Determinants of Health   Financial Resource Strain: Not on file  Food Insecurity: Not on file  Transportation Needs: Not on file  Physical Activity:  Not on file  Stress: Not on file  Social Connections: Not on file     Observations/Objective: Awake, alert and oriented x 3   Review of Systems  Constitutional: Negative for fever, malaise/fatigue and weight loss.  HENT: Negative.  Negative for nosebleeds.   Eyes: Negative.  Negative for blurred vision, double vision and photophobia.  Respiratory: Negative.  Negative for cough and shortness of breath.   Cardiovascular: Negative.  Negative for chest pain, palpitations and leg swelling.   Gastrointestinal: Negative.  Negative for heartburn, nausea and vomiting.  Musculoskeletal: Negative.  Negative for myalgias.  Neurological: Negative.  Negative for dizziness, focal weakness, seizures and headaches.  Psychiatric/Behavioral: Negative.  Negative for suicidal ideas.    Assessment and Plan: Yaxiel was seen today for hospitalization follow-up.  Diagnoses and all orders for this visit:  Encounter to establish care  Type 2 diabetes mellitus with hyperglycemia, without long-term current use of insulin ([place]) -     CMP14+EGFR -     Blood Glucose Monitoring Suppl (TRUE METRIX METER) w/Device KIT; Use as instructed. Check blood glucose level by fingerstick twice per day. -     glucose blood (TRUE METRIX BLOOD GLUCOSE TEST) test strip; Use as instructed. Check blood glucose level by fingerstick twice per day. -     TRUEplus Lancets 28G MISC; Use as instructed. Check blood glucose level by fingerstick twice per day. -     metFORMIN (GLUCOPHAGE) 500 MG tablet; Take 1 tablet (500 mg total) by mouth 2 (two) times daily with a meal. Continue blood sugar control as discussed in office today, low carbohydrate diet, and regular physical exercise as tolerated, 150 minutes per week (30 min each day, 5 days per week, or 50 min 3 days per week). Keep blood sugar logs with fasting goal of 90-130 mg/dl, post prandial (after you eat) less than 180.  For Hypoglycemia: BS <60 and Hyperglycemia BS >400; contact the clinic ASAP. Annual eye exams and foot exams are recommended.  Anemia, unspecified type -     CBC  Pneumonia due to COVID-19 virus -     DG Chest 2 View; Future  Primary hypertension -     metoprolol tartrate (LOPRESSOR) 25 MG tablet; Take 1 tablet (25 mg total) by mouth 2 (two) times daily.  =Continue all antihypertensives as prescribed.  Remember to bring in your blood pressure log with you for your follow up appointment.  DASH/Mediterranean Diets are healthier choices for HTN.      Follow Up Instructions Return in about 6 weeks (around 12/02/2020).     I discussed the assessment and treatment plan with the patient. The patient was provided an opportunity to ask questions and all were answered. The patient agreed with the plan and demonstrated an understanding of the instructions.   The patient was advised to call back or seek an in-person evaluation if the symptoms worsen or if the condition fails to improve as anticipated.  I provided 18 minutes of non-face-to-face time during this encounter including median intraservice time, reviewing previous notes, labs, imaging, medications and explaining diagnosis and management.  Gildardo Pounds, FNP-BC

## 2020-10-22 ENCOUNTER — Other Ambulatory Visit: Payer: Self-pay

## 2021-02-24 ENCOUNTER — Emergency Department (HOSPITAL_COMMUNITY): Payer: Self-pay

## 2021-02-24 ENCOUNTER — Other Ambulatory Visit (HOSPITAL_COMMUNITY): Payer: Self-pay

## 2021-02-24 ENCOUNTER — Encounter (HOSPITAL_COMMUNITY): Payer: Self-pay

## 2021-02-24 ENCOUNTER — Other Ambulatory Visit: Payer: Self-pay

## 2021-02-24 ENCOUNTER — Emergency Department (HOSPITAL_COMMUNITY)
Admission: EM | Admit: 2021-02-24 | Discharge: 2021-02-24 | Disposition: A | Discharge status: Home / Self Care | Payer: Self-pay | Attending: Emergency Medicine | Admitting: Emergency Medicine

## 2021-02-24 DIAGNOSIS — Z87891 Personal history of nicotine dependence: Secondary | ICD-10-CM | POA: Insufficient documentation

## 2021-02-24 DIAGNOSIS — E119 Type 2 diabetes mellitus without complications: Secondary | ICD-10-CM | POA: Insufficient documentation

## 2021-02-24 DIAGNOSIS — Y9389 Activity, other specified: Secondary | ICD-10-CM | POA: Insufficient documentation

## 2021-02-24 DIAGNOSIS — Z7984 Long term (current) use of oral hypoglycemic drugs: Secondary | ICD-10-CM | POA: Insufficient documentation

## 2021-02-24 DIAGNOSIS — Z79899 Other long term (current) drug therapy: Secondary | ICD-10-CM | POA: Insufficient documentation

## 2021-02-24 DIAGNOSIS — X500XXA Overexertion from strenuous movement or load, initial encounter: Secondary | ICD-10-CM | POA: Insufficient documentation

## 2021-02-24 DIAGNOSIS — I10 Essential (primary) hypertension: Secondary | ICD-10-CM | POA: Insufficient documentation

## 2021-02-24 DIAGNOSIS — S39012A Strain of muscle, fascia and tendon of lower back, initial encounter: Secondary | ICD-10-CM | POA: Insufficient documentation

## 2021-02-24 DIAGNOSIS — Z8616 Personal history of COVID-19: Secondary | ICD-10-CM | POA: Insufficient documentation

## 2021-02-24 MED ORDER — KETOROLAC TROMETHAMINE 60 MG/2ML IM SOLN
30.0000 mg | Freq: Once | INTRAMUSCULAR | Status: AC
  Administered 2021-02-24: 30 mg via INTRAMUSCULAR
  Filled 2021-02-24: qty 2

## 2021-02-24 MED ORDER — MELOXICAM 15 MG PO TABS: 15.0000 mg | ORAL_TABLET | Freq: Every day | ORAL | 0 refills | Status: DC

## 2021-02-24 MED ORDER — MELOXICAM 15 MG PO TABS
15.0000 mg | ORAL_TABLET | Freq: Every day | ORAL | 0 refills | Status: DC
  Filled 2021-02-24: qty 10, 10d supply, fill #0

## 2021-02-24 NOTE — ED Triage Notes (Signed)
Spanish interpreter used for triage:  Pt reports lower back pain for the past 4 days along with upper back/neck pain. 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 Pt ambulatory

## 2021-02-24 NOTE — Discharge Instructions (Addendum)
Recommend NSAIDs for pain control. You have no red flag symptoms that would warrant further emergent workup

## 2021-02-24 NOTE — ED Provider Notes (Addendum)
[facility] EMERGENCY DEPARTMENT Provider Note   CSN: [id number] Arrival date & time: 02/24/21  1049     History Chief Complaint  Patient presents with   Back Pain    Ian Pham is a 55 y.o. male.  The history is provided by the patient. The history is limited by a language barrier. A language interpreter was used.  Back Pain Location:  Lumbar spine Quality:  Aching and stabbing Radiates to:  Does not radiate Pain severity:  Mild Duration:  3 days Timing:  Constant Progression:  Unchanged Chronicity:  New Context: lifting heavy objects, occupational injury and physical stress   Context: not recent injury   Worsened by:  Bending and movement Associated symptoms: no abdominal pain, no bladder incontinence, no bowel incontinence, no chest pain, no dysuria, no fever, no headaches, no numbness, no paresthesias, no perianal numbness, no tingling and no weakness       Past Medical History:  Diagnosis Date   Diabetes mellitus without complication ([place])    Hypertension     Patient Active Problem List   Diagnosis Date Noted   PNA (pneumonia) 09/23/2020   COVID-19 09/23/2020   Hypoxia     Past Surgical History:  Procedure Laterality Date   no past surgery         No family history on file.  Social History   Tobacco Use   Smoking status: Former   Smokeless tobacco: Former  Substance Use Topics   Alcohol use: Yes    Alcohol/week: 2.0 standard drinks    Types: 2 Cans of beer per week   Drug use: Never    Home Medications Prior to Admission medications   Medication Sig Start Date End Date Taking? Authorizing Provider  amoxicillin-clavulanate (AUGMENTIN) 875-125 MG tablet TAKE 1 TABLET BY MOUTH TWO TIMES DAILY FOR 3 DAYS. 09/27/20 09/27/21  Ghimire, Henreitta Leber, MD  Blood Glucose Monitoring Suppl (TRUE METRIX METER) w/Device KIT Use as instructed. Check blood glucose level by fingerstick twice per day. 10/21/20   Gildardo Pounds,  NP  Blood Glucose Monitoring Suppl (TRUE METRIX METER) w/Device KIT USE AS INSTRUCTED. CHECK BLOOD GLUCOSE LEVEL BY FINGERSTICK TWICE PER DAY. 10/21/20 10/21/21  Gildardo Pounds, NP  glucose blood (TRUE METRIX BLOOD GLUCOSE TEST) test strip Use as instructed. Check blood glucose level by fingerstick twice per day. 10/21/20   Gildardo Pounds, NP  glucose blood test strip USE AS INSTRUCTED. CHECK BLOOD GLUCOSE LEVEL BY FINGERSTICK TWICE PER DAY. 10/21/20 10/21/21  Gildardo Pounds, NP  glucose blood test strip USE AS DIRECTED UP TO 4 TIMES DAILY 09/28/20 09/28/21  Ghimire, Henreitta Leber, MD  meloxicam (MOBIC) 15 MG tablet Take 1 tablet (15 mg total) by mouth daily. 02/24/21   Regan Lemming, MD  metFORMIN (GLUCOPHAGE) 500 MG tablet Take 1 tablet (500 mg total) by mouth 2 (two) times daily with a meal. 10/21/20 11/20/20  Gildardo Pounds, NP  metFORMIN (GLUCOPHAGE) 500 MG tablet TAKE 1 TABLET (500 MG TOTAL) BY MOUTH TWO TIMES DAILY WITH A MEAL. 09/27/20 09/27/21  Ghimire, Henreitta Leber, MD  metFORMIN (GLUCOPHAGE) 500 MG tablet TAKE 1 TABLET (500 MG TOTAL) BY MOUTH 2 (TWO) TIMES DAILY WITH A MEAL. 10/21/20 10/21/21  Gildardo Pounds, NP  metoprolol tartrate (LOPRESSOR) 25 MG tablet Take 1 tablet (25 mg total) by mouth 2 (two) times daily. 10/21/20   Gildardo Pounds, NP  metoprolol tartrate (LOPRESSOR) 25 MG tablet TAKE 1 TABLET (25 MG  TOTAL) BY MOUTH TWO TIMES DAILY. 09/27/20 09/27/21  Ghimire, Henreitta Leber, MD  metoprolol tartrate (LOPRESSOR) 25 MG tablet TAKE 1 TABLET (25 MG TOTAL) BY MOUTH 2 (TWO) TIMES DAILY. 10/21/20 10/21/21  Gildardo Pounds, NP  TRUEplus Lancets 28G MISC Use as instructed. Check blood glucose level by fingerstick twice per day. 10/21/20   Gildardo Pounds, NP  TRUEplus Lancets 28G MISC USE AS INSTRUCTED. CHECK BLOOD GLUCOSE LEVEL BY FINGERSTICK TWICE PER DAY. 10/21/20 10/21/21  Gildardo Pounds, NP  TRUEplus Lancets 28G MISC USE AS DIRECTED UP TO 4 TIMES DAILY 09/28/20 09/28/21  Ghimire, Henreitta Leber, MD    Allergies     Patient has no known allergies.  Review of Systems   Review of Systems  Constitutional:  Negative for chills and fever.  HENT:  Negative for ear pain and sore throat.   Eyes:  Negative for pain and visual disturbance.  Respiratory:  Negative for cough and shortness of breath.   Cardiovascular:  Negative for chest pain and palpitations.  Gastrointestinal:  Negative for abdominal pain, bowel incontinence and vomiting.  Genitourinary:  Negative for bladder incontinence, dysuria and hematuria.  Musculoskeletal:  Positive for back pain. Negative for arthralgias.  Skin:  Negative for color change and rash.  Neurological:  Negative for tingling, seizures, syncope, weakness, numbness, headaches and paresthesias.  All other systems reviewed and are negative.  Physical Exam Updated Vital Signs BP 120/78 (BP Location: Right Arm)   Pulse 72   Temp 97.8 F (36.6 C) (Oral)   Resp 16   Ht 5' (1.524 m)   Wt 99.8 kg   SpO2 97%   BMI 42.97 kg/m   Physical Exam Vitals and nursing note reviewed.  Constitutional:      Appearance: He is well-developed.  HENT:     Head: Normocephalic and atraumatic.  Eyes:     Conjunctiva/sclera: Conjunctivae normal.  Cardiovascular:     Rate and Rhythm: Normal rate and regular rhythm.     Heart sounds: No murmur heard. Pulmonary:     Effort: Pulmonary effort is normal. No respiratory distress.     Breath sounds: Normal breath sounds.  Abdominal:     Palpations: Abdomen is soft.     Tenderness: There is no abdominal tenderness.  Musculoskeletal:     Cervical back: Neck supple.  Skin:    General: Skin is warm and dry.  Neurological:     General: No focal deficit present.     Mental Status: He is alert and oriented to person, place, and time. Mental status is at baseline.     Cranial Nerves: No cranial nerve deficit.     Sensory: No sensory deficit.     Motor: No weakness.     Gait: Gait normal.    ED Results / Procedures / Treatments    Labs (all labs ordered are listed, but only abnormal results are displayed) Labs Reviewed - No data to display  EKG None  Radiology DG Lumbar Spine Complete  Result Date: 02/24/2021 CLINICAL DATA:  Back pain for 4 days EXAM: LUMBAR SPINE - COMPLETE 4+ VIEW COMPARISON:  09/24/2017 mild disc FINDINGS: Five lumbar type vertebral segments. Vertebral body heights and alignment are maintained. No fracture identified. Mild disc height loss of L5-S1. The remaining intervertebral disc spaces are relatively preserved. Minimal degenerative endplate changes. Mild lower lumbar facet arthrosis. IMPRESSION: Mild lower lumbar spondylosis.  No acute findings. Electronically Signed   By: Davina Poke D.O.   On: 02/24/2021  13:44    Procedures Procedures   Medications Ordered in ED Medications  ketorolac (TORADOL) injection 30 mg (30 mg Intramuscular Given 02/24/21 1252)    ED Course  I have reviewed the triage vital signs and the nursing notes.  Pertinent labs & imaging results that were available during my care of the patient were reviewed by me and considered in my medical decision making (see chart for details).    MDM Rules/Calculators/A&P                           55 year old male presenting to the emergency department with lower lumbar back pain.  On exam, the patient had paraspinal muscle tenderness palpation with no significant midline tenderness.  He has no red flag symptoms, specifically no fever or chills, no midline tenderness palpation of the lumbar spine, no recent trauma or falls, no history of IV drug use, no saddle anesthesia, no radicular pain, no numbness or weakness.  Ambulatory in the emergency department without difficulty.  X-ray imaging of the lumbar spine was performed without acute fracture or malalignment.  Symptoms are most consistent with musculoskeletal strain.  Low suspicion for herniated disc or cord compression at this time.  The patient was advised NSAIDs for pain  control and PCP follow-up. Final Clinical Impression(s) / ED Diagnoses Final diagnoses:  Strain of lumbar region, initial encounter    Rx / DC Orders ED Discharge Orders          Ordered    meloxicam (MOBIC) 15 MG tablet  Daily,   Status:  Discontinued        02/24/21 1354    meloxicam (MOBIC) 15 MG tablet  Daily        02/24/21 1415             Regan Lemming, MD 02/24/21 2141    Regan Lemming, MD 02/24/21 2141

## 2021-03-04 ENCOUNTER — Other Ambulatory Visit (HOSPITAL_COMMUNITY): Payer: Self-pay

## 2023-05-04 ENCOUNTER — Emergency Department (HOSPITAL_COMMUNITY)
Admission: EM | Admit: 2023-05-04 | Discharge: 2023-05-04 | Disposition: A | Discharge status: Home / Self Care | Payer: Self-pay | Attending: Emergency Medicine | Admitting: Emergency Medicine

## 2023-05-04 ENCOUNTER — Emergency Department (HOSPITAL_COMMUNITY): Payer: Self-pay

## 2023-05-04 ENCOUNTER — Other Ambulatory Visit (HOSPITAL_COMMUNITY): Payer: Self-pay

## 2023-05-04 ENCOUNTER — Other Ambulatory Visit: Payer: Self-pay

## 2023-05-04 DIAGNOSIS — M545 Low back pain, unspecified: Secondary | ICD-10-CM | POA: Insufficient documentation

## 2023-05-04 DIAGNOSIS — G8929 Other chronic pain: Secondary | ICD-10-CM

## 2023-05-04 DIAGNOSIS — D696 Thrombocytopenia, unspecified: Secondary | ICD-10-CM | POA: Insufficient documentation

## 2023-05-04 DIAGNOSIS — M542 Cervicalgia: Secondary | ICD-10-CM | POA: Insufficient documentation

## 2023-05-04 LAB — CBC WITH DIFFERENTIAL/PLATELET
Abs Immature Granulocytes: 0.01 10*3/uL (ref 0.00–0.07)
Basophils Absolute: 0.1 10*3/uL (ref 0.0–0.1)
Basophils Relative: 1 %
Eosinophils Absolute: 0.3 10*3/uL (ref 0.0–0.5)
Eosinophils Relative: 5 %
HCT: 34.4 % — ABNORMAL LOW (ref 39.0–52.0)
Hemoglobin: 11.8 g/dL — ABNORMAL LOW (ref 13.0–17.0)
Immature Granulocytes: 0 %
Lymphocytes Relative: 29 %
Lymphs Abs: 1.7 10*3/uL (ref 0.7–4.0)
MCH: 33.1 pg (ref 26.0–34.0)
MCHC: 34.3 g/dL (ref 30.0–36.0)
MCV: 96.4 fL (ref 80.0–100.0)
Monocytes Absolute: 0.8 10*3/uL (ref 0.1–1.0)
Monocytes Relative: 13 %
Neutro Abs: 3.1 10*3/uL (ref 1.7–7.7)
Neutrophils Relative %: 52 %
Platelets: 112 10*3/uL — ABNORMAL LOW (ref 150–400)
RBC: 3.57 MIL/uL — ABNORMAL LOW (ref 4.22–5.81)
RDW: 13.5 % (ref 11.5–15.5)
WBC: 5.8 10*3/uL (ref 4.0–10.5)
nRBC: 0 % (ref 0.0–0.2)

## 2023-05-04 LAB — BASIC METABOLIC PANEL
Anion gap: 11 (ref 5–15)
BUN: 8 mg/dL (ref 6–20)
CO2: 20 mmol/L — ABNORMAL LOW (ref 22–32)
Calcium: 8.3 mg/dL — ABNORMAL LOW (ref 8.9–10.3)
Chloride: 101 mmol/L (ref 98–111)
Creatinine, Ser: 0.71 mg/dL (ref 0.61–1.24)
GFR, Estimated: >60 mL/min (ref >60)
Glucose, Bld: 274 mg/dL — ABNORMAL HIGH (ref 70–99)
Potassium: 3.9 mmol/L (ref 3.5–5.1)
Sodium: 132 mmol/L — ABNORMAL LOW (ref 135–145)

## 2023-05-04 MED ORDER — METAXALONE 800 MG PO TABS
800.0000 mg | ORAL_TABLET | Freq: Three times a day (TID) | ORAL | 0 refills | Status: DC
  Filled 2023-05-04: qty 21, 7d supply, fill #0

## 2023-05-04 NOTE — ED Triage Notes (Signed)
Patient states chronic neck, shoulder, and lower back pain from working in [employer]. Taking Tylenol with relief. Also has had multiple episodes of bleeding in his mouth over the last week. States he is not coughing up blood, but the blood just appears in his mouth.

## 2023-05-04 NOTE — ED Provider Triage Note (Signed)
Emergency Medicine Provider Triage Evaluation Note  Ian Pham , a 57 y.o. male  was evaluated in triage.  Pt complains of acute on chronic upper and lower back pain as well as the new development of "bleeding in his mouth" for the last week.  Interpreter services used, as patient is Spanish-speaking.  Patient has a hard time characterizing the blood in his mouth but he states that it is bright red, sometimes "feels his mouth".  He cannot identify if this happens mainly when he brush/floss his his teeth or eats.  He is not coughing or vomiting at the time that the blood feels his mouth.  No clots.  Back pain is described as a soreness, paraspinal from the cervical to lumbar spine, reproducible, worse with movements.  No recent acute injury.  No lower extremity symptoms.  Review of Systems  Positive: Upper and lower back pain, mouth bleeding Negative: Shortness of breath, lower extremity neurosymptoms, hemoptysis, hematemesis  Physical Exam  BP 134/77 (BP Location: Right Arm)   Pulse 86   Temp 98.3 F (36.8 C)   Resp 16   SpO2 95%  Gen:   Awake, no distress   ENT:  No blood in the mouth, poor dentition, no bleeding on the tongue, no blood in the nose or posterior oropharynx Resp:  Normal effort  MSK:   Moves extremities without difficulty, reproducible tenderness to palpation involving the entire paraspinal musculature from the cervical to thoracic region and lateral muscles.  Minimal midline pain that is inconsistent and somewhat reproducible Other:  Well-appearing, no distress  Medical Decision Making  Medically screening exam initiated at 1:52 PM.  Appropriate orders placed.  Ian Pham was informed that the remainder of the evaluation will be completed by another provider, this initial triage assessment does not replace that evaluation, and the importance of remaining in the ED until their evaluation is complete.  Labs and imaging ordered to  evaluate, further workup pending results.  Patient stable for waiting room.   Rozelle Logan, DO 05/04/23 1355

## 2023-05-04 NOTE — ED Notes (Signed)
Patient verbalizes understanding of discharge instructions. Opportunity for questioning and answers were provided. Armband removed by staff, pt discharged from ED. Pt ambulatory to ED waiting room with steady gait.  

## 2023-05-04 NOTE — ED Provider Notes (Signed)
[place] EMERGENCY DEPARTMENT AT [facility] Provider Note   CSN: [id number] Arrival date & time: 05/04/23  1315     History  Chief Complaint  Patient presents with   Back Pain    Ian Pham is a 57 y.o. male.  57 year old male presents with chronic neck and back pain.  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  No bowel or bladder dysfunction.  Also notes periodic bleeding from his mouth.  Unsure of the source of the bleeding.  Characterizes bright red blood that last only for few seconds.  Denies any dark stools.  No bruising appreciated to his body.  No chronic use of alcohol.  No weakness going down his legs.  No treatment use prior to arrival       Home Medications Prior to Admission medications   Medication Sig Start Date End Date Taking? Authorizing Provider  Blood Glucose Monitoring Suppl (TRUE METRIX METER) w/Device KIT Use as instructed. Check blood glucose level by fingerstick twice per day. 10/21/20   Claiborne Rigg, NP  glucose blood (TRUE METRIX BLOOD GLUCOSE TEST) test strip Use as instructed. Check blood glucose level by fingerstick twice per day. 10/21/20   Claiborne Rigg, NP  meloxicam (MOBIC) 15 MG tablet Take 1 tablet (15 mg total) by mouth daily. 02/24/21   Ernie Avena, MD  metFORMIN (GLUCOPHAGE) 500 MG tablet Take 1 tablet (500 mg total) by mouth 2 (two) times daily with a meal. 10/21/20 11/20/20  Claiborne Rigg, NP  metFORMIN (GLUCOPHAGE) 500 MG tablet TAKE 1 TABLET (500 MG TOTAL) BY MOUTH TWO TIMES DAILY WITH A MEAL. 09/27/20 09/27/21  Ghimire, Werner Lean, MD  metFORMIN (GLUCOPHAGE) 500 MG tablet TAKE 1 TABLET (500 MG TOTAL) BY MOUTH 2 (TWO) TIMES DAILY WITH A MEAL. 10/21/20 10/21/21  Claiborne Rigg, NP  metoprolol tartrate (LOPRESSOR) 25 MG tablet Take 1 tablet (25 mg total) by mouth 2 (two) times daily. 10/21/20   Claiborne Rigg, NP  metoprolol tartrate (LOPRESSOR) 25 MG tablet TAKE 1 TABLET (25 MG  TOTAL) BY MOUTH TWO TIMES DAILY. 09/27/20 09/27/21  Ghimire, Werner Lean, MD  metoprolol tartrate (LOPRESSOR) 25 MG tablet TAKE 1 TABLET (25 MG TOTAL) BY MOUTH 2 (TWO) TIMES DAILY. 10/21/20 10/21/21  Claiborne Rigg, NP  TRUEplus Lancets 28G MISC Use as instructed. Check blood glucose level by fingerstick twice per day. 10/21/20   Claiborne Rigg, NP      Allergies    Patient has no known allergies.    Review of Systems   Review of Systems  All other systems reviewed and are negative.   Physical Exam Updated Vital Signs BP 134/77 (BP Location: Right Arm)   Pulse 86   Temp 98.3 F (36.8 C)   Resp 16   SpO2 95%  Physical Exam Vitals and nursing note reviewed.  Constitutional:      General: He is not in acute distress.    Appearance: Normal appearance. He is well-developed. He is not toxic-appearing.  HENT:     Head: Normocephalic and atraumatic.  Eyes:     General: Lids are normal.     Conjunctiva/sclera: Conjunctivae normal.     Pupils: Pupils are equal, round, and reactive to light.  Neck:     Thyroid: No thyroid mass.     Trachea: No tracheal deviation.  Cardiovascular:     Rate and Rhythm: Normal rate and regular rhythm.  Heart sounds: Normal heart sounds. No murmur heard.    No gallop.  Pulmonary:     Effort: Pulmonary effort is normal. No respiratory distress.     Breath sounds: Normal breath sounds. No stridor. No decreased breath sounds, wheezing, rhonchi or rales.  Abdominal:     General: There is no distension.     Palpations: Abdomen is soft.     Tenderness: There is no abdominal tenderness. There is no rebound.  Musculoskeletal:        General: No tenderness. Normal range of motion.       Arms:     Cervical back: Normal range of motion and neck supple.  Skin:    General: Skin is warm and dry.     Findings: No abrasion or rash.  Neurological:     Mental Status: He is alert and oriented to person, place, and time. Mental status is at baseline.     GCS: GCS  eye subscore is 4. GCS verbal subscore is 5. GCS motor subscore is 6.     Cranial Nerves: Cranial nerves are intact. No cranial nerve deficit.     Sensory: No sensory deficit.     Motor: Motor function is intact.  Psychiatric:        Attention and Perception: Attention normal.        Speech: Speech normal.        Behavior: Behavior normal.     ED Results / Procedures / Treatments   Labs (all labs ordered are listed, but only abnormal results are displayed) Labs Reviewed  CBC WITH DIFFERENTIAL/PLATELET - Abnormal; Notable for the following components:      Result Value   RBC 3.57 (*)    Hemoglobin 11.8 (*)    HCT 34.4 (*)    Platelets 112 (*)    All other components within normal limits  BASIC METABOLIC PANEL - Abnormal; Notable for the following components:   Sodium 132 (*)    CO2 20 (*)    Glucose, Bld 274 (*)    Calcium 8.3 (*)    All other components within normal limits    EKG None  Radiology DG Thoracic Spine 2 View  Result Date: 05/04/2023 CLINICAL DATA:  Pain.  No history of trauma provided EXAM: THORACIC SPINE 2 VIEWS COMPARISON:  None Available. FINDINGS: Preserved vertebral body height, alignment and bone mineralization. No listhesis. There is slight disc height loss with osteophytes at T11-12. Of note the upper portion of the thoracic spine is not well seen on the lateral view. Please see other x-rays from same day. IMPRESSION: Degenerative changes of the lower thoracic spine at T11-12. Electronically Signed   By: Karen Kays M.D.   On: 05/04/2023 15:37   DG Lumbar Spine Complete  Result Date: 05/04/2023 CLINICAL DATA:  Pain.  No history of trauma EXAM: LUMBAR SPINE - COMPLETE 5 VIEW COMPARISON:  02/24/2021 x-ray series FINDINGS: Five lumbar-type vertebral bodies. Preserved vertebral body heights. No listhesis or spondylolysis. Preserved bone mineralization. Scattered diffuse endplate osteophytes identified greatest at L4-5 and L5-S1. Mild disc height loss at  L5-S1. Prominent lower lumbar facet degenerative changes. IMPRESSION: Mild degenerative changes greatest at L5-S1. Slightly progressive from previous Electronically Signed   By: Karen Kays M.D.   On: 05/04/2023 15:35   DG Cervical Spine Complete  Result Date: 05/04/2023 CLINICAL DATA:  Pain.  No provided history of trauma EXAM: CERVICAL SPINE - COMPLETE 6 VIEW COMPARISON:  None Available. FINDINGS: Preserved vertebral body height, disc  height, alignment and prevertebral soft tissues. Preserved bone mineralization. No listhesis. The oblique views are under rotated. No severe neural foraminal osseous encroachment. Dental disease identified with potential caries. Please correlate with clinical findings. Please see separate dictation of other portions of the spine IMPRESSION: No acute osseous abnormality. Electronically Signed   By: Karen Kays M.D.   On: 05/04/2023 15:32    Procedures Procedures    Medications Ordered in ED Medications - No data to display  ED Course/ Medical Decision Making/ A&P                                 Medical Decision Making  X-rays of patient's cervical thoracic lumbar spine without evidence of acute fracture.  Patient's CBC shows no evidence of anemia.  Patient had mild thrombocytopenia but this is stable when compared to other studies.  Will be given [facility] as well as prescription for muscle relaxants a video interpreter was used for this encounter        Final Clinical Impression(s) / ED Diagnoses Final diagnoses:  None    Rx / DC Orders ED Discharge Orders     None         Lorre Nick, MD 05/04/23 1738

## 2023-05-05 ENCOUNTER — Other Ambulatory Visit (HOSPITAL_COMMUNITY): Payer: Self-pay

## 2023-06-08 ENCOUNTER — Inpatient Hospital Stay (INDEPENDENT_AMBULATORY_CARE_PROVIDER_SITE_OTHER): Payer: Self-pay | Admitting: Primary Care

## 2023-08-10 ENCOUNTER — Emergency Department (HOSPITAL_COMMUNITY)
Admission: EM | Admit: 2023-08-10 | Discharge: 2023-08-10 | Disposition: A | Discharge status: Home / Self Care | Payer: Self-pay | Attending: Emergency Medicine | Admitting: Emergency Medicine

## 2023-08-10 ENCOUNTER — Emergency Department (HOSPITAL_COMMUNITY): Payer: Self-pay

## 2023-08-10 ENCOUNTER — Encounter (HOSPITAL_COMMUNITY): Payer: Self-pay | Admitting: Emergency Medicine

## 2023-08-10 DIAGNOSIS — Z20822 Contact with and (suspected) exposure to covid-19: Secondary | ICD-10-CM | POA: Insufficient documentation

## 2023-08-10 DIAGNOSIS — M545 Low back pain, unspecified: Secondary | ICD-10-CM | POA: Insufficient documentation

## 2023-08-10 MED ORDER — METHOCARBAMOL 500 MG PO TABS: 500.0000 mg | ORAL_TABLET | Freq: Two times a day (BID) | ORAL | 0 refills | Status: DC

## 2023-08-10 MED ORDER — STERILE WATER FOR INJECTION IJ SOLN
INTRAMUSCULAR | Status: AC
  Filled 2023-08-10: qty 10

## 2023-08-10 MED ORDER — KETOROLAC TROMETHAMINE 15 MG/ML IJ SOLN
15.0000 mg | Freq: Once | INTRAMUSCULAR | Status: AC
  Administered 2023-08-10: 15 mg via INTRAMUSCULAR
  Filled 2023-08-10: qty 1

## 2023-08-10 MED ORDER — DOXYCYCLINE HYCLATE 100 MG PO CAPS: 100.0000 mg | ORAL_CAPSULE | Freq: Two times a day (BID) | ORAL | 0 refills | Status: DC

## 2023-08-10 MED ORDER — CEFTRIAXONE SODIUM 1 G IJ SOLR
1.0000 g | Freq: Once | INTRAMUSCULAR | Status: AC
  Administered 2023-08-10: 1 g via INTRAMUSCULAR
  Filled 2023-08-10: qty 10

## 2023-08-10 MED ORDER — DOXYCYCLINE HYCLATE 100 MG PO TABS
100.0000 mg | ORAL_TABLET | Freq: Once | ORAL | Status: AC
  Administered 2023-08-10: 100 mg via ORAL
  Filled 2023-08-10: qty 1

## 2023-08-10 NOTE — Discharge Instructions (Signed)
 You have pneumonia on x-ray.  I have sent antibiotic into the pharmacy for you.  Please pick this up and take this as directed.  Please call the clinic that I have listed above for you tomorrow to establish care.  Your chest x-ray needs to be repeated in a couple weeks to ensure that it is getting better.  If he noticed any worsening symptoms please return to the emergency room immediately. I have sent a muscle relaxer into the pharmacy for you.  This is for your back pain.  It will make you drowsy.  Do not drive or do anything else dangerous after taking this medication.
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
## 2023-08-10 NOTE — ED Triage Notes (Signed)
 Pt here from home with c/o cough and chronic back pian , has been seen for back pain before, states that he had a fall at work

## 2023-08-10 NOTE — ED Notes (Signed)
Patient verbalizes understanding of discharge instructions. Opportunity for questioning and answers were provided. Armband removed by staff, pt discharged from ED. Ambulated out to lobby  

## 2023-08-11 LAB — RESP PANEL BY RT-PCR (RSV, FLU A&B, COVID)  RVPGX2
Influenza A by PCR: NEGATIVE
Influenza B by PCR: NEGATIVE
Resp Syncytial Virus by PCR: NEGATIVE
SARS Coronavirus 2 by RT PCR: NEGATIVE

## 2023-08-23 ENCOUNTER — Ambulatory Visit: Payer: Self-pay | Admitting: Family Medicine

## 2023-09-07 ENCOUNTER — Ambulatory Visit: Payer: Self-pay | Admitting: Family Medicine

## 2023-10-13 ENCOUNTER — Encounter (HOSPITAL_COMMUNITY): Payer: Self-pay

## 2023-10-13 ENCOUNTER — Emergency Department (HOSPITAL_COMMUNITY)
Admission: EM | Admit: 2023-10-13 | Discharge: 2023-10-13 | Disposition: A | Discharge status: Home / Self Care | Payer: Self-pay | Attending: Emergency Medicine | Admitting: Emergency Medicine

## 2023-10-13 ENCOUNTER — Emergency Department (HOSPITAL_COMMUNITY): Payer: Self-pay

## 2023-10-13 ENCOUNTER — Other Ambulatory Visit: Payer: Self-pay

## 2023-10-13 DIAGNOSIS — D649 Anemia, unspecified: Secondary | ICD-10-CM | POA: Insufficient documentation

## 2023-10-13 DIAGNOSIS — J1000 Influenza due to other identified influenza virus with unspecified type of pneumonia: Secondary | ICD-10-CM | POA: Insufficient documentation

## 2023-10-13 DIAGNOSIS — I10 Essential (primary) hypertension: Secondary | ICD-10-CM | POA: Insufficient documentation

## 2023-10-13 DIAGNOSIS — E119 Type 2 diabetes mellitus without complications: Secondary | ICD-10-CM | POA: Insufficient documentation

## 2023-10-13 DIAGNOSIS — J101 Influenza due to other identified influenza virus with other respiratory manifestations: Secondary | ICD-10-CM

## 2023-10-13 DIAGNOSIS — J189 Pneumonia, unspecified organism: Secondary | ICD-10-CM

## 2023-10-13 LAB — CBC WITH DIFFERENTIAL/PLATELET
Abs Immature Granulocytes: 0.01 10*3/uL (ref 0.00–0.07)
Basophils Absolute: 0 10*3/uL (ref 0.0–0.1)
Basophils Relative: 1 %
Eosinophils Absolute: 0.1 10*3/uL (ref 0.0–0.5)
Eosinophils Relative: 1 %
HCT: 35.3 % — ABNORMAL LOW (ref 39.0–52.0)
Hemoglobin: 11.6 g/dL — ABNORMAL LOW (ref 13.0–17.0)
Immature Granulocytes: 0 %
Lymphocytes Relative: 30 %
Lymphs Abs: 1.2 10*3/uL (ref 0.7–4.0)
MCH: 28.2 pg (ref 26.0–34.0)
MCHC: 32.9 g/dL (ref 30.0–36.0)
MCV: 85.9 fL (ref 80.0–100.0)
Monocytes Absolute: 0.5 10*3/uL (ref 0.1–1.0)
Monocytes Relative: 12 %
Neutro Abs: 2.3 10*3/uL (ref 1.7–7.7)
Neutrophils Relative %: 56 %
Platelets: 73 10*3/uL — ABNORMAL LOW (ref 150–400)
RBC: 4.11 MIL/uL — ABNORMAL LOW (ref 4.22–5.81)
RDW: 17.4 % — ABNORMAL HIGH (ref 11.5–15.5)
WBC: 4.1 10*3/uL (ref 4.0–10.5)
nRBC: 0 % (ref 0.0–0.2)

## 2023-10-13 LAB — BASIC METABOLIC PANEL
Anion gap: 8 (ref 5–15)
BUN: 7 mg/dL (ref 6–20)
CO2: 21 mmol/L — ABNORMAL LOW (ref 22–32)
Calcium: 8 mg/dL — ABNORMAL LOW (ref 8.9–10.3)
Chloride: 108 mmol/L (ref 98–111)
Creatinine, Ser: 0.76 mg/dL (ref 0.61–1.24)
GFR, Estimated: >60 mL/min (ref >60)
Glucose, Bld: 243 mg/dL — ABNORMAL HIGH (ref 70–99)
Potassium: 3.8 mmol/L (ref 3.5–5.1)
Sodium: 137 mmol/L (ref 135–145)

## 2023-10-13 LAB — GROUP A STREP BY PCR: Group A Strep by PCR: NOT DETECTED

## 2023-10-13 LAB — RESP PANEL BY RT-PCR (RSV, FLU A&B, COVID)  RVPGX2
Influenza A by PCR: POSITIVE — AB
Influenza B by PCR: NEGATIVE
Resp Syncytial Virus by PCR: NEGATIVE
SARS Coronavirus 2 by RT PCR: NEGATIVE

## 2023-10-13 LAB — CBG MONITORING, ED: Glucose-Capillary: 228 mg/dL — ABNORMAL HIGH (ref 70–99)

## 2023-10-13 MED ORDER — AMOXICILLIN-POT CLAVULANATE 875-125 MG PO TABS
1.0000 | ORAL_TABLET | Freq: Once | ORAL | Status: AC
  Administered 2023-10-13: 1 via ORAL
  Filled 2023-10-13: qty 1

## 2023-10-13 MED ORDER — AMOXICILLIN-POT CLAVULANATE 875-125 MG PO TABS
1.0000 | ORAL_TABLET | Freq: Two times a day (BID) | ORAL | 0 refills | Status: AC
Start: 2023-10-13 — End: 2023-10-20

## 2023-10-13 MED ORDER — AZITHROMYCIN 250 MG PO TABS: ORAL_TABLET | ORAL | 0 refills | Status: DC

## 2023-10-13 MED ORDER — BENZONATATE 100 MG PO CAPS: 100.0000 mg | ORAL_CAPSULE | Freq: Three times a day (TID) | ORAL | 0 refills | Status: DC
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
## 2023-10-13 NOTE — ED Provider Triage Note (Signed)
 Emergency Medicine Provider Triage Evaluation Note  Adyen Timoteo Madlyn Frankel , a 58 y.o. male  was evaluated in triage.  Pt complains of presents today for hemoptysis, neck and shoulder pain, dry mouth, and polydipsia x 1 week.  History of diabetes and hypertension not on any meds.  Review of Systems  Positive: Fever, polydipsia, neck and shoulder pain, hemoptysis Negative: Nausea, vomiting, diarrhea  Physical Exam  BP (!) 165/73   Pulse (!) 105   Temp 98.2 F (36.8 C) (Oral)   Resp 18   Ht 5' 4.96" (1.65 m)   Wt 97.5 kg   SpO2 97%   BMI 35.82 kg/m  Gen:   Awake, no distress   Resp:  Normal effort  MSK:   Moves extremities without difficulty  Other:    Medical Decision Making  Medically screening exam initiated at 12:07 PM.  Appropriate orders placed.  Hamdan Timoteo Madlyn Frankel was informed that the remainder of the evaluation will be completed by another provider, this initial triage assessment does not replace that evaluation, and the importance of remaining in the ED until their evaluation is complete.  Labs and imaging ordered   Dolphus Jenny, PA-C 10/13/23 1210

## 2023-10-13 NOTE — ED Provider Notes (Signed)
 [place] [facility] DEPARTMENT AT [facility] Provider Note   CSN: [id number] Arrival date & time: 10/13/23  1139     History Chief Complaint  Patient presents with   Flu  PNA    Ian Pham is a 58 y.o. male.  Patient with past history significant for recent vomiting presents emergency department today with concerns of coughing.  He self-reports a history of type 2 diabetes and hypertension but is not on any medications at this time.  He states that he has been experiencing productive coughing with discolored phlegm as well as some blood streaking over the last few days.  No sick contacts as far as he is aware.  Endorsing subjective fevers and chills at home.  No nausea, vomiting, or diarrhea.  Denies any recent feelings of weakness or syncope.  HPI     Home Medications Prior to Admission medications   Medication Sig Start Date End Date Taking? Authorizing Provider  amoxicillin-clavulanate (AUGMENTIN) 875-125 MG tablet Take 1 tablet by mouth every 12 (twelve) hours for 7 days. 10/13/23 10/20/23 Yes Smitty Knudsen, PA-C  azithromycin (ZITHROMAX) 250 MG tablet Take first 2 tablets together, then 1 every day until finished. 10/13/23  Yes Smitty Knudsen, PA-C  benzonatate (TESSALON) 100 MG capsule Take 1 capsule (100 mg total) by mouth every 8 (eight) hours. 10/13/23  Yes Smitty Knudsen, PA-C  Blood Glucose Monitoring Suppl (TRUE METRIX METER) w/Device KIT Use as instructed. Check blood glucose level by fingerstick twice per day. 10/21/20   Claiborne Rigg, NP  doxycycline (VIBRAMYCIN) 100 MG capsule Take 1 capsule (100 mg total) by mouth 2 (two) times daily. 08/10/23   Karie Mainland, Amjad, PA-C  glucose blood (TRUE METRIX BLOOD GLUCOSE TEST) test strip Use as instructed. Check blood glucose level by fingerstick twice per day. 10/21/20   Claiborne Rigg, NP  meloxicam (MOBIC) 15 MG tablet Take 1 tablet (15 mg total) by mouth daily. 02/24/21   Ernie Avena, MD   metaxalone (SKELAXIN) 800 MG tablet Take 1 tablet (800 mg total) by mouth 3 (three) times daily. 05/04/23   Lorre Nick, MD  metFORMIN (GLUCOPHAGE) 500 MG tablet Take 1 tablet (500 mg total) by mouth 2 (two) times daily with a meal. 10/21/20 11/20/20  Claiborne Rigg, NP  metFORMIN (GLUCOPHAGE) 500 MG tablet TAKE 1 TABLET (500 MG TOTAL) BY MOUTH TWO TIMES DAILY WITH A MEAL. 09/27/20 09/27/21  Ghimire, Werner Lean, MD  metFORMIN (GLUCOPHAGE) 500 MG tablet TAKE 1 TABLET (500 MG TOTAL) BY MOUTH 2 (TWO) TIMES DAILY WITH A MEAL. 10/21/20 10/21/21  Claiborne Rigg, NP  methocarbamol (ROBAXIN) 500 MG tablet Take 1 tablet (500 mg total) by mouth 2 (two) times daily. 08/10/23   Marita Kansas, PA-C  metoprolol tartrate (LOPRESSOR) 25 MG tablet Take 1 tablet (25 mg total) by mouth 2 (two) times daily. 10/21/20   Claiborne Rigg, NP  metoprolol tartrate (LOPRESSOR) 25 MG tablet TAKE 1 TABLET (25 MG TOTAL) BY MOUTH TWO TIMES DAILY. 09/27/20 09/27/21  Ghimire, Werner Lean, MD  metoprolol tartrate (LOPRESSOR) 25 MG tablet TAKE 1 TABLET (25 MG TOTAL) BY MOUTH 2 (TWO) TIMES DAILY. 10/21/20 10/21/21  Claiborne Rigg, NP  TRUEplus Lancets 28G MISC Use as instructed. Check blood glucose level by fingerstick twice per day. 10/21/20   Claiborne Rigg, NP      Allergies    Patient has no known allergies.    Review of Systems   Review of  Systems  Constitutional:  Positive for fever.  Respiratory:  Positive for cough.   All other systems reviewed and are negative.   Physical Exam Updated Vital Signs BP 119/83 (BP Location: Right Arm)   Pulse 94   Temp 98.4 F (36.9 C) (Oral)   Resp 19   Ht 5' 4.96" (1.65 m)   Wt 97.5 kg   SpO2 95%   BMI 35.82 kg/m  Physical Exam Vitals and nursing note reviewed.  Constitutional:      General: He is not in acute distress.    Appearance: Normal appearance. He is well-developed. He is not ill-appearing, toxic-appearing or diaphoretic.  HENT:     Head: Normocephalic and atraumatic.   Eyes:     Conjunctiva/sclera: Conjunctivae normal.  Cardiovascular:     Rate and Rhythm: Normal rate and regular rhythm.     Heart sounds: No murmur heard. Pulmonary:     Effort: Pulmonary effort is normal. No respiratory distress.     Breath sounds: Rales present. No wheezing.  Abdominal:     Palpations: Abdomen is soft.     Tenderness: There is no abdominal tenderness.  Musculoskeletal:        General: No swelling.     Cervical back: Neck supple.  Skin:    General: Skin is warm and dry.     Capillary Refill: Capillary refill takes less than 2 seconds.  Neurological:     Mental Status: He is alert.  Psychiatric:        Mood and Affect: Mood normal.     ED Results / Procedures / Treatments   Labs (all labs ordered are listed, but only abnormal results are displayed) Labs Reviewed  RESP PANEL BY RT-PCR (RSV, FLU A&B, COVID)  RVPGX2 - Abnormal; Notable for the following components:      Result Value   Influenza A by PCR POSITIVE (*)    All other components within normal limits  CBC WITH DIFFERENTIAL/PLATELET - Abnormal; Notable for the following components:   RBC 4.11 (*)    Hemoglobin 11.6 (*)    HCT 35.3 (*)    RDW 17.4 (*)    Platelets 73 (*)    All other components within normal limits  BASIC METABOLIC PANEL - Abnormal; Notable for the following components:   CO2 21 (*)    Glucose, Bld 243 (*)    Calcium 8.0 (*)    All other components within normal limits  CBG MONITORING, ED - Abnormal; Notable for the following components:   Glucose-Capillary 228 (*)    All other components within normal limits  GROUP A STREP BY PCR  CBG MONITORING, ED    EKG None  Radiology DG Chest 2 View Result Date: 10/13/2023 CLINICAL DATA:  Cough, hemoptysis EXAM: CHEST - 2 VIEW COMPARISON:  08/10/2023 FINDINGS: Frontal and lateral views of the chest demonstrate an enlarged cardiac silhouette. There is peribronchial cuffing, with patchy bilateral ground-glass airspace disease. No  effusion or pneumothorax. No acute bony abnormalities. IMPRESSION: 1. Patchy bilateral airspace disease, which could reflect multifocal infection or sequela of hemorrhage. Electronically Signed   By: Sharlet Salina M.D.   On: 10/13/2023 14:53    Procedures Procedures    Medications Ordered in ED Medications  amoxicillin-clavulanate (AUGMENTIN) 875-125 MG per tablet 1 tablet (1 tablet Oral Given 10/13/23 2236)    ED Course/ Medical Decision Making/ A&P  Medical Decision Making Risk Prescription drug management.   This patient presents to the ED for concern of cough.  Differential diagnosis includes COVID-19, flu Enza, pneumonia, bronchitis   Lab Tests:  I Ordered, and personally interpreted labs.  The pertinent results include: CBC with baseline anemia with hemoglobin 11.6, BMP unremarkable, respiratory panel positive for influenza A, group A strep negative, CBG elevated to 28   Imaging Studies ordered:  I ordered imaging studies including chest x-ray I independently visualized and interpreted imaging which showed 1. Patchy bilateral airspace disease, which could reflect multifocal infection or sequela of hemorrhage. I agree with the radiologist interpretation   Problem List / ED Course:  Patient is self-reported history of hypertension and diabetes presents emergency department with concerns of coughing and fevers.  Reports has been ongoing for last several days.  He endorses productive coughing with discolored sputum as well as some recent blood streaking in his sputum.  Denies any feelings of shortness of breath, chest pain, or leg swelling.  No recent sick contacts as far she is aware.  Patient evaluated in triage with labs ordered at that time. Physical exam reassuring as patient is otherwise well-appearing however he does have notable rales present in bilateral lower lungs.  No appreciable wheezing or diminished lung sounds. Workup shows  patient is flu positive.  CBC with baseline anemia with hemoglobin 11.6.  BMP unremarkable, the patient have negative, CBG elevated at 228. Chest xray shows patchy bilateral airspace disease which could reflect multifocal pneumonia or sequela of hemorrhage. Given patient self-reported blood streaked sputum with significant coughing and notable rales on exam in middle and lower lung fields, appears more consistent with multifocal pneumonia. Will start on combination of Augmentin and Azithromycin. Advised strict return precautions. Patient otherwise stable for discharge home and outpatient follow up.   Final Clinical Impression(s) / ED Diagnoses Final diagnoses:  Multifocal pneumonia  Influenza A    Rx / DC Orders ED Discharge Orders          Ordered    amoxicillin-clavulanate (AUGMENTIN) 875-125 MG tablet  Every 12 hours        10/13/23 2222    azithromycin (ZITHROMAX) 250 MG tablet        10/13/23 2222    benzonatate (TESSALON) 100 MG capsule  Every 8 hours        10/13/23 2222              Smitty Knudsen, PA-C 10/13/23 2253    Benjiman Core, MD 10/14/23 1445

## 2023-10-13 NOTE — ED Notes (Signed)
 Pt eloped from ed lobby

## 2023-10-13 NOTE — Discharge Instructions (Addendum)
 You were seen in the ER today for concerns of coughing and fevers. You tested positive for influenza A and found to have pneumonia. I have started you on two antibiotics, Augmentin and Azithromycin. Please take these as prescribed.  For any concerns of new or worsening symptoms, return to the ER. Otherwise, please follow up with your primary care provider once you are able to reestablish care with them.

## 2024-08-05 ENCOUNTER — Encounter (HOSPITAL_COMMUNITY): Payer: Self-pay

## 2024-08-05 ENCOUNTER — Observation Stay (HOSPITAL_COMMUNITY): Payer: Self-pay | Admitting: Certified Registered Nurse Anesthetist

## 2024-08-05 ENCOUNTER — Emergency Department (HOSPITAL_COMMUNITY): Payer: Self-pay

## 2024-08-05 ENCOUNTER — Inpatient Hospital Stay (HOSPITAL_COMMUNITY)
Admission: EM | Admit: 2024-08-05 | Discharge: 2024-08-11 | DRG: 432 | Disposition: A | Discharge status: Home / Self Care | Payer: MEDICAID | Attending: Internal Medicine | Admitting: Internal Medicine

## 2024-08-05 ENCOUNTER — Observation Stay (HOSPITAL_COMMUNITY): Payer: MEDICAID | Admitting: Certified Registered Nurse Anesthetist

## 2024-08-05 ENCOUNTER — Encounter (HOSPITAL_COMMUNITY): Admission: EM | Disposition: A | Payer: Self-pay | Source: Home / Self Care | Attending: Family Medicine

## 2024-08-05 DIAGNOSIS — F1010 Alcohol abuse, uncomplicated: Secondary | ICD-10-CM | POA: Diagnosis present

## 2024-08-05 DIAGNOSIS — E119 Type 2 diabetes mellitus without complications: Secondary | ICD-10-CM | POA: Diagnosis present

## 2024-08-05 DIAGNOSIS — K920 Hematemesis: Secondary | ICD-10-CM

## 2024-08-05 DIAGNOSIS — Z781 Physical restraint status: Secondary | ICD-10-CM

## 2024-08-05 DIAGNOSIS — Z87891 Personal history of nicotine dependence: Secondary | ICD-10-CM

## 2024-08-05 DIAGNOSIS — Z515 Encounter for palliative care: Secondary | ICD-10-CM

## 2024-08-05 DIAGNOSIS — K7030 Alcoholic cirrhosis of liver without ascites: Principal | ICD-10-CM | POA: Diagnosis present

## 2024-08-05 DIAGNOSIS — K7460 Unspecified cirrhosis of liver: Secondary | ICD-10-CM | POA: Diagnosis present

## 2024-08-05 DIAGNOSIS — F10239 Alcohol dependence with withdrawal, unspecified: Secondary | ICD-10-CM | POA: Diagnosis present

## 2024-08-05 DIAGNOSIS — D649 Anemia, unspecified: Secondary | ICD-10-CM | POA: Diagnosis present

## 2024-08-05 DIAGNOSIS — I10 Essential (primary) hypertension: Secondary | ICD-10-CM

## 2024-08-05 DIAGNOSIS — K7031 Alcoholic cirrhosis of liver with ascites: Secondary | ICD-10-CM | POA: Insufficient documentation

## 2024-08-05 DIAGNOSIS — Z603 Acculturation difficulty: Secondary | ICD-10-CM | POA: Diagnosis present

## 2024-08-05 DIAGNOSIS — K766 Portal hypertension: Secondary | ICD-10-CM | POA: Diagnosis present

## 2024-08-05 DIAGNOSIS — K2920 Alcoholic gastritis without bleeding: Secondary | ICD-10-CM

## 2024-08-05 DIAGNOSIS — Z7984 Long term (current) use of oral hypoglycemic drugs: Secondary | ICD-10-CM

## 2024-08-05 DIAGNOSIS — D6959 Other secondary thrombocytopenia: Secondary | ICD-10-CM | POA: Diagnosis present

## 2024-08-05 DIAGNOSIS — Z79899 Other long term (current) drug therapy: Secondary | ICD-10-CM

## 2024-08-05 DIAGNOSIS — E872 Acidosis, unspecified: Secondary | ICD-10-CM | POA: Diagnosis present

## 2024-08-05 DIAGNOSIS — R1310 Dysphagia, unspecified: Secondary | ICD-10-CM | POA: Diagnosis present

## 2024-08-05 DIAGNOSIS — K254 Chronic or unspecified gastric ulcer with hemorrhage: Secondary | ICD-10-CM | POA: Diagnosis present

## 2024-08-05 DIAGNOSIS — K922 Gastrointestinal hemorrhage, unspecified: Principal | ICD-10-CM | POA: Diagnosis present

## 2024-08-05 DIAGNOSIS — K7682 Hepatic encephalopathy: Secondary | ICD-10-CM | POA: Diagnosis present

## 2024-08-05 DIAGNOSIS — I864 Gastric varices: Secondary | ICD-10-CM | POA: Diagnosis present

## 2024-08-05 DIAGNOSIS — K2921 Alcoholic gastritis with bleeding: Secondary | ICD-10-CM | POA: Diagnosis present

## 2024-08-05 DIAGNOSIS — R791 Abnormal coagulation profile: Secondary | ICD-10-CM | POA: Diagnosis present

## 2024-08-05 DIAGNOSIS — Z791 Long term (current) use of non-steroidal anti-inflammatories (NSAID): Secondary | ICD-10-CM

## 2024-08-05 DIAGNOSIS — Z66 Do not resuscitate: Secondary | ICD-10-CM | POA: Diagnosis present

## 2024-08-05 DIAGNOSIS — I8511 Secondary esophageal varices with bleeding: Secondary | ICD-10-CM | POA: Diagnosis present

## 2024-08-05 DIAGNOSIS — Z6831 Body mass index (BMI) 31.0-31.9, adult: Secondary | ICD-10-CM

## 2024-08-05 HISTORY — PX: SCHLEROTHERAPY: SHX5440

## 2024-08-05 HISTORY — PX: GASTRIC VARICES BANDING: SHX5519

## 2024-08-05 HISTORY — PX: HOT HEMOSTASIS: SHX5433

## 2024-08-05 HISTORY — PX: ESOPHAGOGASTRODUODENOSCOPY: SHX5428

## 2024-08-05 LAB — COMPREHENSIVE METABOLIC PANEL WITH GFR
ALT: 35 U/L (ref 0–44)
AST: 60 U/L — ABNORMAL HIGH (ref 15–41)
Albumin: 3 g/dL — ABNORMAL LOW (ref 3.5–5.0)
Alkaline Phosphatase: 213 U/L — ABNORMAL HIGH (ref 38–126)
Anion gap: 10 (ref 5–15)
BUN: 28 mg/dL — ABNORMAL HIGH (ref 6–20)
CO2: 23 mmol/L (ref 22–32)
Calcium: 8.9 mg/dL (ref 8.9–10.3)
Chloride: 107 mmol/L (ref 98–111)
Creatinine, Ser: 0.69 mg/dL (ref 0.61–1.24)
GFR, Estimated: >60 mL/min
Glucose, Bld: 155 mg/dL — ABNORMAL HIGH (ref 70–99)
Potassium: 3.8 mmol/L (ref 3.5–5.1)
Sodium: 140 mmol/L (ref 135–145)
Total Bilirubin: 4.1 mg/dL — ABNORMAL HIGH (ref 0.0–1.2)
Total Protein: 7.5 g/dL (ref 6.5–8.1)

## 2024-08-05 LAB — HEMOGLOBIN AND HEMATOCRIT, BLOOD
HCT: 29 % — ABNORMAL LOW (ref 39.0–52.0)
HCT: 28.9 % — ABNORMAL LOW (ref 39.0–52.0)
Hemoglobin: 9.9 g/dL — ABNORMAL LOW (ref 13.0–17.0)
Hemoglobin: 10.1 g/dL — ABNORMAL LOW (ref 13.0–17.0)

## 2024-08-05 LAB — CBC WITH DIFFERENTIAL/PLATELET
Abs Immature Granulocytes: 0.05 K/uL (ref 0.00–0.07)
Basophils Absolute: 0.1 K/uL (ref 0.0–0.1)
Basophils Relative: 1 %
Eosinophils Absolute: 0.1 K/uL (ref 0.0–0.5)
Eosinophils Relative: 1 %
HCT: 33.2 % — ABNORMAL LOW (ref 39.0–52.0)
Hemoglobin: 11.8 g/dL — ABNORMAL LOW (ref 13.0–17.0)
Immature Granulocytes: 1 %
Lymphocytes Relative: 23 %
Lymphs Abs: 2 K/uL (ref 0.7–4.0)
MCH: 34.7 pg — ABNORMAL HIGH (ref 26.0–34.0)
MCHC: 35.5 g/dL (ref 30.0–36.0)
MCV: 97.6 fL (ref 80.0–100.0)
Monocytes Absolute: 1 K/uL (ref 0.1–1.0)
Monocytes Relative: 12 %
Neutro Abs: 5.3 K/uL (ref 1.7–7.7)
Neutrophils Relative %: 62 %
Platelets: 86 K/uL — ABNORMAL LOW (ref 150–400)
RBC: 3.4 MIL/uL — ABNORMAL LOW (ref 4.22–5.81)
RDW: 14.7 % (ref 11.5–15.5)
WBC: 8.5 K/uL (ref 4.0–10.5)
nRBC: 0 % (ref 0.0–0.2)

## 2024-08-05 LAB — HEMOGLOBIN A1C
Hgb A1c MFr Bld: 5.5 % (ref 4.8–5.6)
Mean Plasma Glucose: 111.15 mg/dL

## 2024-08-05 LAB — LIPASE, BLOOD: Lipase: 20 U/L (ref 11–51)

## 2024-08-05 LAB — GLUCOSE, CAPILLARY
Glucose-Capillary: 138 mg/dL — ABNORMAL HIGH (ref 70–99)
Glucose-Capillary: 132 mg/dL — ABNORMAL HIGH (ref 70–99)
Glucose-Capillary: 141 mg/dL — ABNORMAL HIGH (ref 70–99)

## 2024-08-05 LAB — CBG MONITORING, ED
Glucose-Capillary: 130 mg/dL — ABNORMAL HIGH (ref 70–99)
Glucose-Capillary: 139 mg/dL — ABNORMAL HIGH (ref 70–99)

## 2024-08-05 LAB — ETHANOL: Alcohol, Ethyl (B): <15 mg/dL

## 2024-08-05 LAB — PROTIME-INR
INR: 1.5 — ABNORMAL HIGH (ref 0.8–1.2)
Prothrombin Time: 18.7 s — ABNORMAL HIGH (ref 11.4–15.2)

## 2024-08-05 LAB — TYPE AND SCREEN
ABO/RH(D): O POS
Antibody Screen: NEGATIVE

## 2024-08-05 LAB — PHOSPHORUS: Phosphorus: 2.6 mg/dL (ref 2.5–4.6)

## 2024-08-05 LAB — MAGNESIUM: Magnesium: 2.1 mg/dL (ref 1.7–2.4)

## 2024-08-05 LAB — ABO/RH: ABO/RH(D): O POS

## 2024-08-05 LAB — AMMONIA: Ammonia: 145 umol/L — ABNORMAL HIGH (ref 9–35)

## 2024-08-05 MED ORDER — ACETAMINOPHEN 650 MG RE SUPP: 650.0000 mg | Freq: Four times a day (QID) | RECTAL | Status: DC | PRN

## 2024-08-05 MED ORDER — HYDROMORPHONE HCL 1 MG/ML IJ SOLN: 0.5000 mg | INTRAMUSCULAR | Status: DC | PRN

## 2024-08-05 MED ORDER — SODIUM CHLORIDE 0.9 % IV SOLN: INTRAVENOUS | Status: DC | PRN

## 2024-08-05 MED ORDER — ONDANSETRON HCL 4 MG/2ML IJ SOLN
INTRAMUSCULAR | Status: DC | PRN
  Administered 2024-08-05: 4 mg via INTRAVENOUS

## 2024-08-05 MED ORDER — ACETAMINOPHEN 325 MG PO TABS
650.0000 mg | ORAL_TABLET | Freq: Four times a day (QID) | ORAL | Status: DC | PRN
  Administered 2024-08-05: 650 mg via ORAL
  Filled 2024-08-05: qty 2

## 2024-08-05 MED ORDER — DEXAMETHASONE SOD PHOSPHATE PF 10 MG/ML IJ SOLN
INTRAMUSCULAR | Status: DC | PRN
  Administered 2024-08-05: 10 mg via INTRAVENOUS

## 2024-08-05 MED ORDER — SODIUM CHLORIDE 0.9 % IV SOLN: INTRAVENOUS | Status: AC

## 2024-08-05 MED ORDER — NALTREXONE HCL 50 MG PO TABS
50.0000 mg | ORAL_TABLET | Freq: Every day | ORAL | Status: DC
  Administered 2024-08-05 – 2024-08-11 (×5): 50 mg via ORAL
  Filled 2024-08-05 (×8): qty 1

## 2024-08-05 MED ORDER — SODIUM CHLORIDE 0.9 % IV BOLUS
1000.0000 mL | Freq: Once | INTRAVENOUS | Status: AC
  Administered 2024-08-05: 1000 mL via INTRAVENOUS

## 2024-08-05 MED ORDER — LACTATED RINGERS IV BOLUS
250.0000 mL | Freq: Once | INTRAVENOUS | Status: AC
  Administered 2024-08-05: 250 mL via INTRAVENOUS

## 2024-08-05 MED ORDER — LORAZEPAM 1 MG PO TABS
1.0000 mg | ORAL_TABLET | ORAL | Status: AC | PRN
  Administered 2024-08-05: 1 mg via ORAL
  Filled 2024-08-05: qty 1

## 2024-08-05 MED ORDER — METOCLOPRAMIDE HCL 5 MG/ML IJ SOLN
10.0000 mg | Freq: Once | INTRAMUSCULAR | Status: AC
  Administered 2024-08-05: 10 mg via INTRAVENOUS
  Filled 2024-08-05: qty 2

## 2024-08-05 MED ORDER — PROPOFOL 10 MG/ML IV BOLUS
INTRAVENOUS | Status: DC | PRN
  Administered 2024-08-05: 160 mg via INTRAVENOUS

## 2024-08-05 MED ORDER — EPHEDRINE SULFATE (PRESSORS) 25 MG/5ML IV SOSY
PREFILLED_SYRINGE | INTRAVENOUS | Status: DC | PRN
  Administered 2024-08-05: 10 mg via INTRAVENOUS
  Administered 2024-08-05 (×3): 5 mg via INTRAVENOUS

## 2024-08-05 MED ORDER — THIAMINE MONONITRATE 100 MG PO TABS
100.0000 mg | ORAL_TABLET | Freq: Every day | ORAL | Status: DC
  Administered 2024-08-05 – 2024-08-09 (×4): 100 mg via ORAL
  Filled 2024-08-05 (×5): qty 1

## 2024-08-05 MED ORDER — SENNOSIDES-DOCUSATE SODIUM 8.6-50 MG PO TABS
1.0000 | ORAL_TABLET | Freq: Every day | ORAL | Status: DC
  Administered 2024-08-05 – 2024-08-08 (×2): 1 via ORAL
  Filled 2024-08-05 (×2): qty 1

## 2024-08-05 MED ORDER — ONDANSETRON HCL 4 MG/2ML IJ SOLN
4.0000 mg | Freq: Four times a day (QID) | INTRAMUSCULAR | Status: DC | PRN
  Administered 2024-08-05: 4 mg via INTRAVENOUS
  Filled 2024-08-05: qty 2

## 2024-08-05 MED ORDER — ACETAMINOPHEN 325 MG PO TABS: 650.0000 mg | ORAL_TABLET | Freq: Four times a day (QID) | ORAL | Status: DC | PRN

## 2024-08-05 MED ORDER — OCTREOTIDE LOAD VIA INFUSION
50.0000 ug | Freq: Once | INTRAVENOUS | Status: AC
  Administered 2024-08-05: 50 ug via INTRAVENOUS
  Filled 2024-08-05: qty 25

## 2024-08-05 MED ORDER — HYDRALAZINE HCL 20 MG/ML IJ SOLN: 10.0000 mg | INTRAMUSCULAR | Status: DC | PRN

## 2024-08-05 MED ORDER — OXYCODONE HCL 5 MG PO TABS: 5.0000 mg | ORAL_TABLET | ORAL | Status: DC | PRN

## 2024-08-05 MED ORDER — PHENYLEPHRINE HCL-NACL 20-0.9 MG/250ML-% IV SOLN
INTRAVENOUS | Status: DC | PRN
  Administered 2024-08-05: 50 ug/min via INTRAVENOUS

## 2024-08-05 MED ORDER — SUCRALFATE 1 GM/10ML PO SUSP
1.0000 g | Freq: Three times a day (TID) | ORAL | Status: DC
  Administered 2024-08-05 – 2024-08-11 (×15): 1 g via ORAL
  Filled 2024-08-05 (×18): qty 10

## 2024-08-05 MED ORDER — MORPHINE SULFATE (PF) 4 MG/ML IV SOLN
4.0000 mg | Freq: Once | INTRAVENOUS | Status: AC
  Administered 2024-08-05: 4 mg via INTRAVENOUS
  Filled 2024-08-05: qty 1

## 2024-08-05 MED ORDER — VASOPRESSIN 20 UNIT/ML IV SOLN
INTRAVENOUS | Status: DC | PRN
  Administered 2024-08-05 (×3): 1 [IU] via INTRAVENOUS

## 2024-08-05 MED ORDER — SODIUM CHLORIDE 0.9% FLUSH
3.0000 mL | Freq: Two times a day (BID) | INTRAVENOUS | Status: DC
  Administered 2024-08-05 – 2024-08-11 (×10): 3 mL via INTRAVENOUS

## 2024-08-05 MED ORDER — METHOCARBAMOL 500 MG PO TABS: 500.0000 mg | ORAL_TABLET | Freq: Three times a day (TID) | ORAL | Status: DC | PRN

## 2024-08-05 MED ORDER — FLEET ENEMA RE ENEM: 1.0000 | ENEMA | Freq: Once | RECTAL | Status: DC | PRN

## 2024-08-05 MED ORDER — SODIUM CHLORIDE 0.9 % IV SOLN: INTRAVENOUS | Status: DC

## 2024-08-05 MED ORDER — SODIUM CHLORIDE 0.9 % IV SOLN
2.0000 g | Freq: Once | INTRAVENOUS | Status: AC
  Administered 2024-08-05: 2 g via INTRAVENOUS
  Filled 2024-08-05: qty 20

## 2024-08-05 MED ORDER — PHENYLEPHRINE 80 MCG/ML (10ML) SYRINGE FOR IV PUSH (FOR BLOOD PRESSURE SUPPORT)
PREFILLED_SYRINGE | INTRAVENOUS | Status: DC | PRN
  Administered 2024-08-05 (×5): 160 ug via INTRAVENOUS

## 2024-08-05 MED ORDER — LACTULOSE ENEMA
300.0000 mL | ORAL | Status: DC
  Filled 2024-08-05 (×2): qty 300

## 2024-08-05 MED ORDER — ONDANSETRON HCL 4 MG PO TABS: 4.0000 mg | ORAL_TABLET | Freq: Four times a day (QID) | ORAL | Status: DC | PRN

## 2024-08-05 MED ORDER — ROCURONIUM BROMIDE 100 MG/10ML IV SOLN
INTRAVENOUS | Status: DC | PRN
  Administered 2024-08-05: 30 mg via INTRAVENOUS

## 2024-08-05 MED ORDER — THIAMINE HCL 100 MG/ML IJ SOLN
100.0000 mg | Freq: Every day | INTRAMUSCULAR | Status: DC
  Administered 2024-08-07: 100 mg via INTRAVENOUS
  Filled 2024-08-05: qty 2

## 2024-08-05 MED ORDER — CARVEDILOL 25 MG PO TABS
25.0000 mg | ORAL_TABLET | Freq: Two times a day (BID) | ORAL | Status: DC
  Administered 2024-08-05: 25 mg via ORAL
  Filled 2024-08-05: qty 2

## 2024-08-05 MED ORDER — ADULT MULTIVITAMIN W/MINERALS CH
1.0000 | ORAL_TABLET | Freq: Every day | ORAL | Status: DC
  Administered 2024-08-05 – 2024-08-11 (×6): 1 via ORAL
  Filled 2024-08-05 (×7): qty 1

## 2024-08-05 MED ORDER — ONDANSETRON HCL 4 MG/2ML IJ SOLN: 4.0000 mg | Freq: Four times a day (QID) | INTRAMUSCULAR | Status: DC | PRN

## 2024-08-05 MED ORDER — SUGAMMADEX SODIUM 200 MG/2ML IV SOLN
INTRAVENOUS | Status: DC | PRN
  Administered 2024-08-05: 400 mg via INTRAVENOUS

## 2024-08-05 MED ORDER — CARVEDILOL 25 MG PO TABS
25.0000 mg | ORAL_TABLET | Freq: Two times a day (BID) | ORAL | Status: DC
  Administered 2024-08-06 – 2024-08-08 (×3): 25 mg via ORAL
  Filled 2024-08-05 (×5): qty 1

## 2024-08-05 MED ORDER — SENNOSIDES-DOCUSATE SODIUM 8.6-50 MG PO TABS: 1.0000 | ORAL_TABLET | Freq: Every evening | ORAL | Status: DC | PRN

## 2024-08-05 MED ORDER — SODIUM CHLORIDE 0.9 % IV SOLN
50.0000 ug/h | INTRAVENOUS | Status: DC
  Administered 2024-08-05 – 2024-08-08 (×6): 50 ug/h via INTRAVENOUS
  Filled 2024-08-05 (×6): qty 1

## 2024-08-05 MED ORDER — IPRATROPIUM BROMIDE 0.02 % IN SOLN: 0.5000 mg | Freq: Four times a day (QID) | RESPIRATORY_TRACT | Status: DC | PRN

## 2024-08-05 MED ORDER — PANTOPRAZOLE SODIUM 40 MG IV SOLR
40.0000 mg | Freq: Two times a day (BID) | INTRAVENOUS | Status: DC
  Administered 2024-08-05 – 2024-08-11 (×12): 40 mg via INTRAVENOUS
  Filled 2024-08-05 (×12): qty 10

## 2024-08-05 MED ORDER — MIDAZOLAM HCL 2 MG/2ML IJ SOLN
INTRAMUSCULAR | Status: AC
  Filled 2024-08-05: qty 2

## 2024-08-05 MED ORDER — LIDOCAINE 2% (20 MG/ML) 5 ML SYRINGE
INTRAMUSCULAR | Status: DC | PRN
  Administered 2024-08-05: 60 mg via INTRAVENOUS

## 2024-08-05 MED ORDER — IOHEXOL 350 MG/ML SOLN
75.0000 mL | Freq: Once | INTRAVENOUS | Status: AC | PRN
  Administered 2024-08-05: 75 mL via INTRAVENOUS

## 2024-08-05 MED ORDER — PANTOPRAZOLE SODIUM 40 MG IV SOLR
40.0000 mg | Freq: Once | INTRAVENOUS | Status: AC
  Administered 2024-08-05: 40 mg via INTRAVENOUS
  Filled 2024-08-05: qty 10

## 2024-08-05 MED ORDER — VASOPRESSIN 20 UNIT/ML IV SOLN
INTRAVENOUS | Status: AC
  Filled 2024-08-05: qty 1

## 2024-08-05 MED ORDER — SODIUM CHLORIDE 0.9 % IV SOLN
2.0000 g | INTRAVENOUS | Status: DC
  Administered 2024-08-06: 2 g via INTRAVENOUS
  Filled 2024-08-05: qty 20

## 2024-08-05 MED ORDER — SODIUM CHLORIDE 0.9% FLUSH: 3.0000 mL | Freq: Two times a day (BID) | INTRAVENOUS | Status: DC

## 2024-08-05 MED ORDER — LACTULOSE 10 GM/15ML PO SOLN
30.0000 g | Freq: Three times a day (TID) | ORAL | Status: DC
  Administered 2024-08-06 – 2024-08-11 (×12): 30 g via ORAL
  Filled 2024-08-05 (×15): qty 45

## 2024-08-05 MED ORDER — DIPHENHYDRAMINE HCL 50 MG/ML IJ SOLN
25.0000 mg | Freq: Once | INTRAMUSCULAR | Status: AC
  Administered 2024-08-05: 25 mg via INTRAVENOUS
  Filled 2024-08-05: qty 1

## 2024-08-05 MED ORDER — LORAZEPAM 2 MG/ML IJ SOLN
1.0000 mg | INTRAMUSCULAR | Status: AC | PRN
  Administered 2024-08-05 – 2024-08-06 (×3): 2 mg via INTRAVENOUS
  Administered 2024-08-06: 4 mg via INTRAVENOUS
  Administered 2024-08-06: 1 mg via INTRAVENOUS
  Administered 2024-08-06 – 2024-08-08 (×5): 2 mg via INTRAVENOUS
  Filled 2024-08-05: qty 1
  Filled 2024-08-05: qty 2
  Filled 2024-08-05 (×5): qty 1
  Filled 2024-08-05 (×2): qty 2
  Filled 2024-08-05: qty 1

## 2024-08-05 MED ORDER — INSULIN ASPART 100 UNIT/ML IJ SOLN
0.0000 [IU] | Freq: Three times a day (TID) | INTRAMUSCULAR | Status: DC
  Administered 2024-08-05 – 2024-08-06 (×4): 2 [IU] via SUBCUTANEOUS
  Administered 2024-08-06 – 2024-08-08 (×2): 3 [IU] via SUBCUTANEOUS
  Administered 2024-08-09: 2 [IU] via SUBCUTANEOUS
  Administered 2024-08-09: 8 [IU] via SUBCUTANEOUS
  Administered 2024-08-10: 2 [IU] via SUBCUTANEOUS
  Administered 2024-08-10: 3 [IU] via SUBCUTANEOUS
  Administered 2024-08-10 – 2024-08-11 (×2): 2 [IU] via SUBCUTANEOUS
  Filled 2024-08-05 (×9): qty 1
  Filled 2024-08-05: qty 3
  Filled 2024-08-05: qty 1
  Filled 2024-08-05: qty 2

## 2024-08-05 MED ORDER — MIDAZOLAM HCL (PF) 2 MG/2ML IJ SOLN
INTRAMUSCULAR | Status: DC | PRN
  Administered 2024-08-05: 2 mg via INTRAVENOUS

## 2024-08-05 MED ORDER — BISACODYL 5 MG PO TBEC: 5.0000 mg | DELAYED_RELEASE_TABLET | Freq: Every day | ORAL | Status: DC | PRN

## 2024-08-05 MED ORDER — LACTULOSE ENEMA
300.0000 mL | Freq: Two times a day (BID) | ORAL | Status: DC
  Administered 2024-08-06: 300 mL via RECTAL
  Filled 2024-08-05 (×3): qty 300

## 2024-08-05 MED ORDER — SODIUM CHLORIDE (PF) 0.9 % IJ SOLN
PREFILLED_SYRINGE | INTRAVENOUS | Status: DC | PRN
  Administered 2024-08-05: 5.5 mL

## 2024-08-05 MED ORDER — FOLIC ACID 1 MG PO TABS
1.0000 mg | ORAL_TABLET | Freq: Every day | ORAL | Status: DC
  Administered 2024-08-05 – 2024-08-11 (×6): 1 mg via ORAL
  Filled 2024-08-05 (×7): qty 1

## 2024-08-05 MED ORDER — ZOLPIDEM TARTRATE 5 MG PO TABS: 5.0000 mg | ORAL_TABLET | Freq: Every evening | ORAL | Status: DC | PRN

## 2024-08-05 MED ORDER — SUCCINYLCHOLINE CHLORIDE 200 MG/10ML IV SOSY
PREFILLED_SYRINGE | INTRAVENOUS | Status: DC | PRN
  Administered 2024-08-05: 160 mg via INTRAVENOUS

## 2024-08-05 NOTE — Progress Notes (Signed)
 Patient arrived to 4E, sleeping comfortably, and CCMD notified. BP (!) 95/51   Pulse 67   Temp 98.1 F (36.7 C) (Axillary)   Resp 14   SpO2 95%

## 2024-08-05 NOTE — Op Note (Addendum)
[facility] Patient Name: Ian Pham [facility] Procedure Date : 08/05/2024 MRN: [id number] Attending MD: Glendia BRAVO. Stacia , MD, [id number] Date of Birth: 1966/03/11 CSN: [id number] Age: 59 Admit Type: Inpatient Procedure:                Upper GI endoscopy Indications:              Hematemesis Providers:                Glendia E. Stacia, MD, Collene Edu, RN, Fairy Marina, Technician Referring MD:              Medicines:                General Anesthesia Complications:            No immediate complications. Estimated Blood Loss:     Estimated blood loss was minimal. Procedure:                Pre-Anesthesia Assessment:                           - Prior to the procedure, a History and Physical                            was performed, and patient medications and                            allergies were reviewed. The patient's tolerance of                            previous anesthesia was also reviewed. The risks                            and benefits of the procedure and the sedation                            options and risks were discussed with the patient.                            All questions were answered, and informed consent                            was obtained. Prior Anticoagulants: The patient has                            taken no anticoagulant or antiplatelet agents. ASA                            Grade Assessment: III - A patient with severe                            systemic disease. After reviewing the risks and  benefits, the patient was deemed in satisfactory                            condition to undergo the procedure.                           After obtaining informed consent, the endoscope was                            passed under direct vision. Throughout the                            procedure, the patient's blood pressure, pulse, and                            oxygen  saturations were monitored continuously. The                            [id number]-H190 ([id number]) Olympus endoscope was introduced                            through the mouth, and advanced to the second part                            of duodenum. The upper GI endoscopy was                            accomplished without difficulty. The patient                            tolerated the procedure well. Scope In: Scope Out: Findings:      Grade II varices were found in the lower third of the esophagus. They       were medium in size. Two bands were successfully placed with complete       eradication, resulting in deflation of varices. Bleeding had stopped at       the end of the maneuver.      The exam of the esophagus was otherwise normal.      Type 2 gastroesophageal varices (GOV2, esophageal varices which extend       along the fundus) with no bleeding were found in the gastric fundus.       There were no stigmata of recent bleeding. They were large in largest       diameter.      One non-bleeding cratered gastric ulcer with an adherent clot was found       at the pylorus. The lesion was 10 mm in largest dimension. The borders       of the ulcer were successfully injected with 5 mL of a 0.1 mg/mL       solution of epinephrine  for hemostasis. Appropriate blanching was       observed. The adherent was removed with suction from the endoscope,       revealing a protuberant vessel.. Coagulation for hemostasis using       bipolar probe was successful. After the first application, there was       slight oozing, prompting a  second application. There was no oozing       following the second bipolar application. The defect was felt to be to       large/deep for hemoclip closure. To reduce risk of recurrent bleeding, 3       mL hemostatic gel was applied over the treated lesion.      Diffuse mild inflammation characterized by erythema and friability was       found in the gastric body.      The  examined duodenum was normal. Impression:               - Grade II esophageal varices. Completely                            eradicated. Banded.                           - Type 2 gastroesophageal varices (GOV2, esophageal                            varices which extend along the fundus), without                            bleeding.                           - Non-bleeding gastric ulcer with a visible vessel.                            Injected. Treated with bipolar cautery. hemostatic                            spray applied.                           - Alcoholic gastritis.                           - Normal examined duodenum.                           - No specimens collected.                           - The patient had multiple possible bleeding                            sources. The pyloric ulcer had definitely recently                            bled and was the most likely source of the                            presenting GI bleed. The esophageal varices had                            some erythematous streaks which may have  represented reflux esophagitis, but given their                            size and his presentation, banding was performed.                           - The large gastric varices did not have bleeding                            stigmata, but the mucosa in the gastric cardia was                            very erythematous and inflamed. Low suspicion that                            the patiet bled from the Moderate Sedation:      N/A Recommendation:           - Return patient to hospital ward for ongoing care.                           - NPO except meds today. Can advance to clear                            liquid diet tomorrow if no bleeding overnight.                           - Continue octreotide  x 72 hours                           - Continue ceftriaxone  while hospitalized. Plan for                            5 days total  empiric antibiotics.                           - Continue PPI IV BID                           - Start rifaximin  550 mg PO BID when alert.                           - Start carvedilol  6.25 mg daily prior to discharge                            for secondary bleeding prophylaxis.                           - Administer lactulose  enema 300 ml BID until                            patient alert enough to reliably take PO                           - Avoid  all NSAIDs                           - Check H.pylori stool antigen.                           - Recommend eventual IR evaluation for                            consideration of TIPS given present of large                            gastric varices. Currently, patient would not be                            good candidate for prophylactic TIPS because of                            encephalopathy. Procedure Code(s):        --- Professional ---                           [phone], Esophagogastroduodenoscopy, flexible,                            transoral; with band ligation of esophageal/gastric                            varices                           43255, 59, Esophagogastroduodenoscopy, flexible,                            transoral; with control of bleeding, any method Diagnosis Code(s):        --- Professional ---                           I85.00, Esophageal varices without bleeding                           I86.4, Gastric varices                           K25.4, Chronic or unspecified gastric ulcer with                            hemorrhage                           K29.20, Alcoholic gastritis without bleeding                           K92.0, Hematemesis CPT copyright 2022 American [employer] Association. All rights reserved. The codes documented in this report are preliminary and upon coder review may  be revised to meet current compliance requirements. Ian Pham E. Stacia, MD 08/05/2024 1:44:31 PM This report has been signed  electronically. Number of Addenda:  0 °

## 2024-08-05 NOTE — Transfer of Care (Signed)
 Immediate Anesthesia Transfer of Care Note  Patient: Ian Pham  Procedure(s) Performed: EGD (ESOPHAGOGASTRODUODENOSCOPY) EGD, WITH ARGON PLASMA COAGULATION SCLEROTHERAPY, VARICOSE VEINS, ESOPHAGUS BAND LIGATION, GASTRIC VARICES  Patient Location: PACU  Anesthesia Type:General  Level of Consciousness: drowsy and patient cooperative  Airway & Oxygen Therapy: Patient Spontanous Breathing and Patient connected to face mask oxygen  Post-op Assessment: Report given to RN, Post -op Vital signs reviewed and stable, Patient moving all extremities, and Patient moving all extremities X 4  Post vital signs: Reviewed and stable  Last Vitals:  Vitals Value Taken Time  BP 105/54 08/05/24 13:39  Temp    Pulse 66 08/05/24 13:42  Resp 20 08/05/24 13:42  SpO2 97 % 08/05/24 13:42  Vitals shown include unfiled device data.  Last Pain:  Vitals:   08/05/24 1233  TempSrc: Temporal  PainSc: 8          Complications: No notable events documented.

## 2024-08-05 NOTE — ED Triage Notes (Signed)
 EMS from home. Pt report blurry vision then bloody vomit. HX: ETOH. 2-3 beers per day. 132/palp, 120 hr, 18 RR, 96% RA.

## 2024-08-05 NOTE — Anesthesia Procedure Notes (Signed)
 Procedure Name: Intubation Date/Time: 08/05/2024 12:50 PM  Performed by: Arvell Edsel HERO, CRNAPre-anesthesia Checklist: Emergency Drugs available, Suction available, Patient being monitored, Timeout performed and Patient identified Patient Re-evaluated:Patient Re-evaluated prior to induction Oxygen Delivery Method: Circle system utilized Preoxygenation: Pre-oxygenation with 100% oxygen Induction Type: IV induction and Rapid sequence Laryngoscope Size: Glidescope and 4 Grade View: Grade I Tube type: Oral Tube size: 7.5 mm Number of attempts: 1 Airway Equipment and Method: Patient positioned with wedge pillow and Video-laryngoscopy Placement Confirmation: ETT inserted through vocal cords under direct vision, positive ETCO2 and breath sounds checked- equal and bilateral Secured at: 23 cm Tube secured with: Tape Dental Injury: Teeth and Oropharynx as per pre-operative assessment

## 2024-08-05 NOTE — Assessment & Plan Note (Signed)
 Fairly controlled with last A1c 7.2 -withholding home medication of metformin  -Checking CBG, covering with SSI insulin  coverage

## 2024-08-05 NOTE — Progress Notes (Signed)
" °  Carryover admission to the Day Admitter.  I discussed this case with the EDP, Dr. Emil.  Per these discussions:   This is a 59 year old male with history of chronic alcohol abuse, who presents complaining of 4 episodes of hematemesis, all of which occurred 24 hours ago.  In the ensuing 24 hours, he notes some intermittent nausea, but no additional vomiting.  Per my discussions with the EDP, vital signs are stable, no evidence of hypotension.  Additionally, hemoglobin stable, although INR is slightly elevated as is total bilirubin.  CT abdomen/pelvis is reported to show evidence of new diagnosis of cirrhosis.   He has received dose of IV Protonix , Rocephin  for SBP prophylaxis, and EDP has contacted on-call [facility] gastroenterology requesting consult for the morning.  Type and screen is already been ordered.   I have placed an order for observation for further evaluation management of the above.  I have placed some additional preliminary admit orders via the adult multi-morbid admission order set. I have also ordered prn IV Zofran , n.p.o., and placed order for Protonix  40 mg IV twice daily.    Eva Pore, DO Hospitalist  "

## 2024-08-05 NOTE — Progress Notes (Signed)
" °  PROGRESS NOTE    Patient: Ian Pham                            PCP: Pcp, No                    DOB: Aug 14, 1965            DOA: 08/05/2024 FMW:[id number]             DOS: 08/05/2024, 3:56 PM   LOS: 0 days   Date of Service: The patient was seen and examined on 08/05/2024    Brief Narrative:    Mr. Jovaun Levene is a 59 year old male with a significant medical history of Type 2 Diabetes (DM2), Hypertension (HTN), and Liver Cirrhosis (secondary to chronic alcohol use) who presents with a 24-hour history of hematemesis. The patient reports four distinct episodes of vomiting bright red blood over the past day.  Currently, the patient reports a limited oral intake; he is able to tolerate liquids but has been unable to tolerate solid food. He describes diffuse abdominal pain, which he characterizes as non-radiating; he denies any associated abdominal distention. Denies having any fever, chills nausea or vomiting.    Assessment & Plan:   Principal Problem:   Acute upper GI bleed Active Problems:   Liver cirrhosis (HCC)   Essential hypertension   DM II (diabetes mellitus, type II), controlled (HCC)   Alcohol abuse   Gastric ulcer with hemorrhage   Encephalopathy, hepatic (HCC)   Secondary esophageal varices with bleeding (HCC)     Assessment and Plan: * Acute upper GI bleed - Esophageal varices - Gastric ulcer Hemoptysis -    Blood pressure (!) 95/51, pulse 67, temperature 98.1 F (36.7 C), temperature source Axillary, resp. rate 14, SpO2 95%.  08/05/2024 Status post EGD, will continue hemoptysis due to another episode of hemoptysis, patient was taken to EGD.  Status post EGD-tolerated procedure EGD findings: Grade 2 varices in the lower third of esophagus, 2 bands were successfully placed, deflated varices, bleeding has stopped No bleeding was found in the gastric fundus negative for any stigmata, 1 nonbleeding gastric ulcer clot was found at the  pylorus.  The borders of the ulcer was injected with epinephrine ,   - Continue IV octreotide  -Continue IV Protonix  - Continue IV fluids   - Holding Coreg  tonight -as blood pressure is running soft      SIGNED: Adriana DELENA Grams, MD, FHM. FAAFP. Jolynn Pack - [facility] hospitalist Time spent - 55 min.  In seeing, evaluating and examining the patient. Reviewing medical records, labs, drawn plan of care. [facility],  Pager (please use [email or web address] to page/ text) Please use Epic Secure Chat for non-urgent communication (7AM-7PM)  If 7PM-7AM, please contact night-coverage [email or web address], 08/05/2024, 3:56 PM     "

## 2024-08-05 NOTE — Assessment & Plan Note (Signed)
 History of hepatic cirrhosis due to chronic alcohol use/abuse -Monitoring LFTs closely -Currently stable, with lipase of 20, AST 60, ALT of 35, INR 1.5

## 2024-08-05 NOTE — Progress Notes (Signed)
 RN contacted me. Patient having hematemesis ago. I tried to reach  significant other in Epic to discuss EGD. Used [employer] (phone) but neither phone number functioning at the moment. Will continue to try and reach significant other for emergent EGD. Will probably need to be intubated for EGD due to blood being in stomach.   RN is starting a 2nd IV , patient already typed and crossmatched.

## 2024-08-05 NOTE — Assessment & Plan Note (Signed)
 Patient reporting alcohol use for past 24 hours -Monitoring for withdrawal -To reduce craving, adding naltrexone  -Initiating CIWA protocol

## 2024-08-05 NOTE — Hospital Course (Signed)
" °  Mr. 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 The patient reports four distinct episodes of vomiting bright red blood over the past day.  Currently, the patient reports a limited oral intake; he is able to tolerate liquids but has been unable to tolerate solid food. He describes diffuse abdominal pain, which he characterizes as non-radiating; he denies any associated abdominal distention. Denies having any fever, chills nausea or vomiting. "

## 2024-08-05 NOTE — Assessment & Plan Note (Signed)
 Hemoptysis In the setting of chronic alcohol use/abuse, liver cirrhosis, -ruling out esophageal varices versus tear -Monitoring H&H closely -N.p.o. -Continue IV fluid - Continue Protonix  4 mg twice daily, Carafate  - GI consulted, appreciate further evaluation recommendations

## 2024-08-05 NOTE — Consult Note (Addendum)
 "                                               Consultation Note   Referring Provider:   [facility] PCP: Pcp, No Primary Gastroenterologist: [facility]        Reason for Consultation:  Glendia Holt, MD DOA: 08/05/2024         Hospital Day: 1   Assessment and Plan:  59 year old male with   Alcohol use disorder Upper GI bleed / hematemesis, dark stool Altered mental status (suspect hepatic encephalopathy) Cirrhosis on CT scan MELD 3.0: 17 at 08/05/2024  3:32 AM MELD-Na: 16 at 08/05/2024  3:32 AM MDF 20.2 ( with 15.2 PT control)  No evidence for portal hypertension on CT scan.  But if altered mental status represents hepatic encephalopathy which he likely does given asterixis and no other immediate calls for confusion and then this would be considered decompensated cirrhosis.   Not acutely intoxicated with alcohol level < 10 .   Upper GI bleed possibly secondary to portal hypertensive gastropathy, PUD Continue empiric Rocephin  Continue empiric octreotide  Twice daily IV PPI Lactulose  enemas ordered.  Patient is falling asleep I do not think he can safely take p.o. right now CIWA protocol in place Monitor H&H Continue Thiamine  supplement NPO for now. Continue maintenance IVF( has no ascites or edema) Patient will need EGD. Would prefer to wait until mental status improves and also give stomach time to  clear out blood to minimize risk of aspiration. If mental status not improving with lactulose  will need to look for other etiologies of altered mental status and also will need to consent wife for the procedure .    Chronic thrombocytopenia Possibly secondary to bone marrow suppression from chronic alcoholism.  No splenomegaly on CT scan  Abnormal gallbladder on CT scan Markedly distended gallbladder, no definite gallstones.  Patient is not tender on exam.  Normal white count.   See PMH for any additional medical history  / medical problems   Principal Problem:    Acute upper GI bleed Active Problems:   Liver cirrhosis (HCC)   Essential hypertension   DM II (diabetes mellitus, type II), controlled (HCC)   Alcohol abuse   History of Present Illness:  Spanish interpreter used for this interview.   Patient is a 59 year old non-English-speaking male with a history of alcohol use disorder admitted with hematemesis.  Patient kept falling asleep during this interview, also with periodic confusion so details may not be reliable.  He began having bright red hematemesis yesterday afternoon around 3 PM he had about 4 episodes.  He cannot tell me the last time he had an episode of hematemesis but believes it was around 2:00 this morning.  He also endorses blood in his stools yesterday.  He has not had any associated abdominal pain.  He very occasionally takes ibuprofen.  No prior history of peptic ulcer disease.  Patient says he drinks a 40 ounce beer most days of the week.   Patient is married with children.  He lives in [place].   Evaluation :  In the ED patient is hemodynamically stable, WBC 8.5, hemoglobin 11.8( At baselin), MCV 97 , platelets 86 .  BUN 28, alk phos 213, albumin 3, AST 60, ALT 85, total bilirubin 4.1.  Sodium 140  CT with contrast 1.Markedly nodular liver contour is compatible  with cirrhosis, new in the interval. Associated vascular sequelae of portal venous hypertension. 2. Markedly distended gallbladder with some ill definition of the gallbladder wall but no evidence for gallstones. If there is clinical concern for acute cholecystitis, right upper quadrant ultrasound could be used to further evaluate. 3. Left colonic diverticulosis without diverticulitis.   Most recent Endoscopic Procedures:  None  Recent Labs    08/05/24 0332  PROT 7.5  ALBUMIN 3.0*  AST 60*  ALT 35  ALKPHOS 213*  BILITOT 4.1*   Recent Labs    08/05/24 0332  WBC 8.5  HGB 11.8*  HCT 33.2*  MCV 97.6  PLT 86*   Recent Labs    08/05/24 0332  NA 140   K 3.8  CL 107  CO2 23  GLUCOSE 155*  BUN 28*  CREATININE 0.69  CALCIUM 8.9    Review of Systems: Unable to be contained secondary to patient's mental status  Physical Exam: Vital signs in last 24 hours: Temp:  [97.6 F (36.4 C)] 97.6 F (36.4 C) (01/10 0322) Pulse Rate:  [95-102] 96 (01/10 0750) Resp:  [16-19] 19 (01/10 0750) BP: (118-128)/(66-74) 121/66 (01/10 0750) SpO2:  [94 %-100 %] 100 % (01/10 0750)   General: Well-developed male in NAD Psych:  Cooperative. Normal mood and affect Eyes: Pupils equal Ears:  Normal auditory acuity Nose: No deformity, discharge or lesions Neck:  Supple, no masses felt Lungs:  Clear to auscultation.  Heart:  Regular rate, regular rhythm.  Abdomen:  Soft, nondistended, nontender, active bowel sounds, no masses felt Rectal : Black liquid stool in vault eferred Msk: Symmetrical without gross deformities.  Neurologic:  Alert, oriented to place and year, speech slightly slurred, + asterixis Extremities : No edema Skin:  Intact without significant lesions.   OUTPATIENT MEDICATIONS Prior to Admission medications  Medication Sig Start Date End Date Taking? Authorizing Provider  azithromycin  (ZITHROMAX ) 250 MG tablet Take first 2 tablets together, then 1 every day until finished. 10/13/23   Zelaya, Oscar A, PA-C  benzonatate  (TESSALON ) 100 MG capsule Take 1 capsule (100 mg total) by mouth every 8 (eight) hours. 10/13/23   Zelaya, Oscar A, PA-C  Blood Glucose Monitoring Suppl (TRUE METRIX METER) w/Device KIT Use as instructed. Check blood glucose level by fingerstick twice per day. 10/21/20   Fleming, Zelda W, NP  doxycycline  (VIBRAMYCIN ) 100 MG capsule Take 1 capsule (100 mg total) by mouth 2 (two) times daily. 08/10/23   Hildegard, Amjad, PA-C  glucose blood (TRUE METRIX BLOOD GLUCOSE TEST) test strip Use as instructed. Check blood glucose level by fingerstick twice per day. 10/21/20   Fleming, Zelda W, NP  meloxicam  (MOBIC ) 15 MG tablet Take 1 tablet  (15 mg total) by mouth daily. 02/24/21   Jerrol Agent, MD  metaxalone  (SKELAXIN ) 800 MG tablet Take 1 tablet (800 mg total) by mouth 3 (three) times daily. 05/04/23   Dasie Faden, MD  metFORMIN  (GLUCOPHAGE ) 500 MG tablet Take 1 tablet (500 mg total) by mouth 2 (two) times daily with a meal. 10/21/20 11/20/20  Fleming, Zelda W, NP  metFORMIN  (GLUCOPHAGE ) 500 MG tablet TAKE 1 TABLET (500 MG TOTAL) BY MOUTH TWO TIMES DAILY WITH A MEAL. 09/27/20 09/27/21  Ghimire, Donalda HERO, MD  metFORMIN  (GLUCOPHAGE ) 500 MG tablet TAKE 1 TABLET (500 MG TOTAL) BY MOUTH 2 (TWO) TIMES DAILY WITH A MEAL. 10/21/20 10/21/21  Fleming, Zelda W, NP  methocarbamol  (ROBAXIN ) 500 MG tablet Take 1 tablet (500 mg total) by mouth 2 (two) times daily.  08/10/23   Hildegard Loge, PA-C  metoprolol  tartrate (LOPRESSOR ) 25 MG tablet Take 1 tablet (25 mg total) by mouth 2 (two) times daily. 10/21/20   Fleming, Zelda W, NP  metoprolol  tartrate (LOPRESSOR ) 25 MG tablet TAKE 1 TABLET (25 MG TOTAL) BY MOUTH TWO TIMES DAILY. 09/27/20 09/27/21  Ghimire, Donalda HERO, MD  metoprolol  tartrate (LOPRESSOR ) 25 MG tablet TAKE 1 TABLET (25 MG TOTAL) BY MOUTH 2 (TWO) TIMES DAILY. 10/21/20 10/21/21  Fleming, Zelda W, NP  TRUEplus Lancets 28G MISC Use as instructed. Check blood glucose level by fingerstick twice per day. 10/21/20   Theotis Haze ORN, NP    Allergies as of 08/05/2024   (No Known Allergies)    INPATIENT MEDICATIONS Current Facility-Administered Medications  Medication Dose Route Frequency Provider Last Rate Last Admin   0.9 %  sodium chloride  infusion   Intravenous Continuous Willette Jest A, MD 100 mL/hr at 08/05/24 0829 New Bag at 08/05/24 0829   acetaminophen  (TYLENOL ) tablet 650 mg  650 mg Oral Q6H PRN Shahmehdi, Jest LABOR, MD       Or   acetaminophen  (TYLENOL ) suppository 650 mg  650 mg Rectal Q6H PRN Shahmehdi, Seyed A, MD       bisacodyl  (DULCOLAX) EC tablet 5 mg  5 mg Oral Daily PRN Shahmehdi, Seyed A, MD       carvedilol  (COREG ) tablet 25 mg  25  mg Oral BID WC Shahmehdi, Seyed A, MD   25 mg at 08/05/24 0827   folic acid  (FOLVITE ) tablet 1 mg  1 mg Oral Daily Shahmehdi, Seyed A, MD       hydrALAZINE  (APRESOLINE ) injection 10 mg  10 mg Intravenous Q4H PRN Shahmehdi, Seyed A, MD       HYDROmorphone  (DILAUDID ) injection 0.5-1 mg  0.5-1 mg Intravenous Q2H PRN Shahmehdi, Seyed A, MD       insulin  aspart (novoLOG ) injection 0-15 Units  0-15 Units Subcutaneous TID WC Shahmehdi, Seyed A, MD   2 Units at 08/05/24 0824   ipratropium (ATROVENT ) nebulizer solution 0.5 mg  0.5 mg Nebulization Q6H PRN Shahmehdi, Seyed A, MD       LORazepam  (ATIVAN ) tablet 1-4 mg  1-4 mg Oral Q1H PRN Shahmehdi, Seyed A, MD       Or   LORazepam  (ATIVAN ) injection 1-4 mg  1-4 mg Intravenous Q1H PRN Shahmehdi, Seyed A, MD       methocarbamol  (ROBAXIN ) tablet 500 mg  500 mg Oral Q8H PRN Shahmehdi, Seyed A, MD       multivitamin with minerals tablet 1 tablet  1 tablet Oral Daily Shahmehdi, Seyed A, MD       naltrexone  (DEPADE) tablet 50 mg  50 mg Oral Daily Shahmehdi, Seyed A, MD       ondansetron  (ZOFRAN ) tablet 4 mg  4 mg Oral Q6H PRN Shahmehdi, Seyed A, MD       Or   ondansetron  (ZOFRAN ) injection 4 mg  4 mg Intravenous Q6H PRN Shahmehdi, Seyed A, MD       oxyCODONE  (Oxy IR/ROXICODONE ) immediate release tablet 5 mg  5 mg Oral Q4H PRN Shahmehdi, Seyed A, MD       pantoprazole  (PROTONIX ) injection 40 mg  40 mg Intravenous Q12H Howerter, Justin B, DO       senna-docusate (Senokot-S) tablet 1 tablet  1 tablet Oral QHS Shahmehdi, Seyed A, MD   1 tablet at 08/05/24 0831   sodium chloride  flush (NS) 0.9 % injection 3 mL  3 mL Intravenous Q12H  Shahmehdi, Adriana LABOR, MD       sucralfate  (CARAFATE ) 1 GM/10ML suspension 1 g  1 g Oral TID WC & HS Shahmehdi, Seyed A, MD   1 g at 08/05/24 0827   thiamine  (VITAMIN B1) tablet 100 mg  100 mg Oral Daily Shahmehdi, Seyed A, MD       Or   thiamine  (VITAMIN B1) injection 100 mg  100 mg Intravenous Daily Shahmehdi, Seyed A, MD       zolpidem   (AMBIEN ) tablet 5 mg  5 mg Oral QHS PRN,MR X 1 Shahmehdi, Seyed A, MD       Current Outpatient Medications  Medication Sig Dispense Refill   azithromycin  (ZITHROMAX ) 250 MG tablet Take first 2 tablets together, then 1 every day until finished. 6 tablet 0   benzonatate  (TESSALON ) 100 MG capsule Take 1 capsule (100 mg total) by mouth every 8 (eight) hours. 21 capsule 0   Blood Glucose Monitoring Suppl (TRUE METRIX METER) w/Device KIT Use as instructed. Check blood glucose level by fingerstick twice per day. 1 kit 0   doxycycline  (VIBRAMYCIN ) 100 MG capsule Take 1 capsule (100 mg total) by mouth 2 (two) times daily. 20 capsule 0   glucose blood (TRUE METRIX BLOOD GLUCOSE TEST) test strip Use as instructed. Check blood glucose level by fingerstick twice per day. 100 each 12   meloxicam  (MOBIC ) 15 MG tablet Take 1 tablet (15 mg total) by mouth daily. 10 tablet 0   metaxalone  (SKELAXIN ) 800 MG tablet Take 1 tablet (800 mg total) by mouth 3 (three) times daily. 21 tablet 0   metFORMIN  (GLUCOPHAGE ) 500 MG tablet Take 1 tablet (500 mg total) by mouth 2 (two) times daily with a meal. 60 tablet 3   metFORMIN  (GLUCOPHAGE ) 500 MG tablet TAKE 1 TABLET (500 MG TOTAL) BY MOUTH TWO TIMES DAILY WITH A MEAL. 60 tablet 11   metFORMIN  (GLUCOPHAGE ) 500 MG tablet TAKE 1 TABLET (500 MG TOTAL) BY MOUTH 2 (TWO) TIMES DAILY WITH A MEAL. 60 tablet 3   methocarbamol  (ROBAXIN ) 500 MG tablet Take 1 tablet (500 mg total) by mouth 2 (two) times daily. 20 tablet 0   metoprolol  tartrate (LOPRESSOR ) 25 MG tablet Take 1 tablet (25 mg total) by mouth 2 (two) times daily. 60 tablet 3   metoprolol  tartrate (LOPRESSOR ) 25 MG tablet TAKE 1 TABLET (25 MG TOTAL) BY MOUTH TWO TIMES DAILY. 60 tablet 0   metoprolol  tartrate (LOPRESSOR ) 25 MG tablet TAKE 1 TABLET (25 MG TOTAL) BY MOUTH 2 (TWO) TIMES DAILY. 60 tablet 3   TRUEplus Lancets 28G MISC Use as instructed. Check blood glucose level by fingerstick twice per day. 100 each 3     Past  Medical History:  Diagnosis Date   Diabetes mellitus without complication (HCC)    Hypertension     Past Surgical History:  Procedure Laterality Date   no past surgery      History reviewed. No pertinent family history.  Social History   Socioeconomic History   Marital status: Single    Spouse name: Not on file   Number of children: Not on file   Years of education: Not on file   Highest education level: Not on file  Occupational History   Not on file  Tobacco Use   Smoking status: Former   Smokeless tobacco: Former  Substance and Sexual Activity   Alcohol use: Yes    Alcohol/week: 2.0 standard drinks of alcohol    Types: 2 Cans of beer per  week   Drug use: Never   Sexual activity: Yes  Other Topics Concern   Not on file  Social History Narrative   ** Merged History Encounter **       Social Drivers of Health   Tobacco Use: Medium Risk (08/05/2024)   Patient History    Smoking Tobacco Use: Former    Smokeless Tobacco Use: Former    Passive Exposure: Not on Stage Manager: Not on [employer] Insecurity: Not on file  Transportation Needs: Not on file  Physical Activity: Not on file  Stress: Not on file  Social Connections: Not on file  Intimate Partner Violence: Not on file  Depression (PHQ2-9): Not on file  Alcohol Screen: Not on file  Housing: Not on file  Utilities: Not on file  Health Literacy: Not on file    Code Status   Code Status: Full Code   Vina Dasen, NP-C   08/05/2024, 8:58 AM  ------------------------------------------------------------------------------  I have taken a history, reviewed the chart and examined the patient. I performed a substantive portion of this encounter, including complete performance of at least one of the key components, in conjunction with the APP. I agree with the APP's note, impression and recommendations  59 year old male with history of alcohol abuse presented with reported hematemesis and  altered mental status.  Reportedly several episodes of bright red hematemesis yesterday, presented with elevated BUN but hemoglobin at baseline.  Elevated INR, elevated bilirubin.  CT with cirrhotic appearing liver and features of portal hypertension.  Patient was started on octreotide , antibiotics and PPI.  He experienced a large-volume episode of hematemesis this morning in the ED.  Patient is encephalopathic, most likely hepatic encephalopathy, ammonia pending.  We were able to reach the patient's wife Mariam) via phone.  I explained the details risks and benefits of an upper endoscopy, and the suspicion for variceal bleeding.  I talked about different interventions we may use to stop bleeding.  We talked about his diagnosis of cirrhosis and hepatic encephalopathy. The patient's wife states that he has never been altered like this before, and this is the first time he is ever vomited up blood, but she was aware that he had cirrhosis.   She consented to proceed with the upper endoscopy.  Will plan for urgent EGD today.  Suspect variceal bleed.  Further recommendations based on endoscopic findings.  Laci Frenkel E. Stacia, MD [facility]    "

## 2024-08-05 NOTE — H&P (Signed)
 " History and Physical   Patient: Ian Pham                            PCP: Pcp, No                    DOB: [birth year]            DOA: 08/05/2024 FMW:[id number]             DOS: 08/05/2024, 7:34 AM  Pcp, No  Patient coming from:   HOME  I have personally reviewed patient's medical records, in electronic medical records, including:  [place] link, and care everywhere.    Chief Complaint:   Chief Complaint  Patient presents with   Emesis    History of present illness:     Mr. 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 The patient reports four distinct episodes of vomiting bright red blood over the past day.  Currently, the patient reports a limited oral intake; he is able to tolerate liquids but has been unable to tolerate solid food. He describes diffuse abdominal pain, which he characterizes as non-radiating; he denies any associated abdominal distention. Denies having any fever, chills nausea or vomiting.   Patient Denies having: Fever, Chills, Cough, SOB, Chest Pain, Abd pain, N/V/D, headache, dizziness, lightheadedness,  Dysuria, Joint pain, rash, open wounds  (History obtained through the interpreter)  ED Course:   Blood pressure 118/66, pulse 95, temperature 97.6 F (36.4 C), resp. rate 19, SpO2 94%. Abnormal labs; hemoglobin 11.8, HCT 33.2, platelets 86, BUN 28, INR 1.5, glucose 155, albumin 3.0, AST 60, ALT 35, CT abdomen/pelvis, nodular liver contour, cirrhosis, distended gallbladder no evidence of stones, colonic diverticulosis without diverticulitis gallbladder  Review of Systems: As per HPI, otherwise 10 point review of systems were negative.    ----------------------------------------------------------------------------------------------------------------------  Allergies[1]  Home MEDs:  Prior to Admission medications  Medication Sig Start Date End Date Taking? Authorizing Provider  azithromycin  (ZITHROMAX ) 250 MG tablet Take first 2 tablets together, then 1 every day until finished. 10/13/23   Zelaya, Oscar A, PA-C  benzonatate  (TESSALON ) 100 MG capsule Take 1 capsule (100 mg total) by mouth every 8 (eight) hours. 10/13/23   Zelaya, Oscar A, PA-C  Blood Glucose Monitoring Suppl (TRUE METRIX METER) w/Device KIT Use as instructed. Check blood glucose level by fingerstick twice per day. 10/21/20   Fleming, Zelda W, NP  doxycycline  (VIBRAMYCIN ) 100 MG capsule Take 1 capsule (100 mg total) by mouth 2 (two) times daily. 08/10/23   Hildegard, Amjad, PA-C  glucose blood (TRUE METRIX BLOOD GLUCOSE TEST) test strip Use as instructed. Check blood glucose level by fingerstick twice per day. 10/21/20   Fleming, Zelda W, NP  meloxicam  (MOBIC ) 15 MG tablet Take 1 tablet (15 mg total) by mouth daily. 02/24/21   Jerrol Agent, MD  metaxalone  (SKELAXIN ) 800 MG tablet Take 1 tablet (800 mg total) by mouth 3 (three) times daily. 05/04/23   Dasie Faden, MD  metFORMIN  (GLUCOPHAGE ) 500 MG tablet Take 1 tablet (500 mg total) by mouth 2 (two) times daily with a meal. 10/21/20 11/20/20  Fleming, Zelda W, NP  metFORMIN  (GLUCOPHAGE ) 500 MG tablet TAKE 1 TABLET (500 MG TOTAL) BY MOUTH TWO TIMES DAILY WITH A MEAL. 09/27/20 09/27/21  Ghimire,  Donalda HERO, MD  metFORMIN  (GLUCOPHAGE ) 500 MG tablet TAKE 1 TABLET (500 MG TOTAL) BY MOUTH 2 (TWO) TIMES DAILY WITH A MEAL. 10/21/20 10/21/21  Fleming, Zelda W, NP  methocarbamol  (ROBAXIN ) 500 MG tablet Take 1 tablet (500 mg total) by mouth 2 (two) times daily. 08/10/23   Hildegard Loge, PA-C  metoprolol  tartrate (LOPRESSOR ) 25 MG tablet Take 1 tablet (25 mg total) by mouth 2 (two) times daily. 10/21/20   Fleming, Zelda W, NP  metoprolol  tartrate  (LOPRESSOR ) 25 MG tablet TAKE 1 TABLET (25 MG TOTAL) BY MOUTH TWO TIMES DAILY. 09/27/20 09/27/21  Ghimire, Donalda HERO, MD  metoprolol  tartrate (LOPRESSOR ) 25 MG tablet TAKE 1 TABLET (25 MG TOTAL) BY MOUTH 2 (TWO) TIMES DAILY. 10/21/20 10/21/21  Fleming, Zelda W, NP  TRUEplus Lancets 28G MISC Use as instructed. Check blood glucose level by fingerstick twice per day. 10/21/20   Fleming, Zelda W, NP    PRN MEDs: acetaminophen  **OR** acetaminophen , bisacodyl , hydrALAZINE , HYDROmorphone  (DILAUDID ) injection, ipratropium, methocarbamol , ondansetron  **OR** ondansetron  (ZOFRAN ) IV, oxyCODONE , zolpidem   Past Medical History:  Diagnosis Date   Diabetes mellitus without complication (HCC)    Hypertension     Past Surgical History:  Procedure Laterality Date   no past surgery       reports that he has quit smoking. He has quit using smokeless tobacco. He reports current alcohol use of about 2.0 standard drinks of alcohol per week. He reports that he does not use drugs.   History reviewed. No pertinent family history.  Physical Exam:   Vitals:   08/05/24 0322 08/05/24 0415 08/05/24 0430 08/05/24 0445  BP: 128/74 121/66 120/68 118/66  Pulse: (!) 102 100 97 95  Resp: 16 18 19 19   Temp: 97.6 F (36.4 C)     SpO2: 95% 94% 95% 94%   Constitutional: NAD, calm, comfortable Eyes: PERRL, lids and conjunctivae normal ENMT: Mucous membranes are moist. Posterior pharynx clear of any exudate or lesions.Normal dentition.  Neck: normal, supple, no masses, no thyromegaly Respiratory: clear to auscultation bilaterally, no wheezing, no crackles. Normal respiratory effort. No accessory muscle use.  Cardiovascular: Regular rate and rhythm, no murmurs / rubs / gallops. No extremity edema. 2+ pedal pulses. No carotid bruits.  Abdomen: no tenderness, no masses palpated. No hepatosplenomegaly. Bowel sounds positive.  Musculoskeletal: no clubbing / cyanosis. No joint deformity upper and lower extremities. Good ROM, no  contractures. Normal muscle tone.  Neurologic: CN II-XII grossly intact. Sensation intact, DTR normal. Strength 5/5 in all 4.  Psychiatric: Normal judgment and insight. Alert and oriented x 3. Normal mood.  Skin: no rashes, lesions, ulcers. No induration Decubitus/ulcers:  Wounds: per nursing documentation         Labs on admission:    I have personally reviewed following labs and imaging studies  CBC: Recent Labs  Lab 08/05/24 0332  WBC 8.5  NEUTROABS 5.3  HGB 11.8*  HCT 33.2*  MCV 97.6  PLT 86*   Basic Metabolic Panel: Recent Labs  Lab 08/05/24 0332  NA 140  K 3.8  CL 107  CO2 23  GLUCOSE 155*  BUN 28*  CREATININE 0.69  CALCIUM 8.9   GFR: CrCl cannot be calculated (Unknown ideal weight.). Liver Function Tests: Recent Labs  Lab 08/05/24 0332  AST 60*  ALT 35  ALKPHOS 213*  BILITOT 4.1*  PROT 7.5  ALBUMIN 3.0*   Recent Labs  Lab 08/05/24 0332  LIPASE 20   No results for input(s): AMMONIA in the last 168  hours. Coagulation Profile: Recent Labs  Lab 08/05/24 0332  INR 1.5*    Urine analysis:    Component Value Date/Time   COLORURINE YELLOW 09/12/2019 1330   APPEARANCEUR CLEAR 09/12/2019 1330   LABSPEC 1.016 09/12/2019 1330   PHURINE 7.0 09/12/2019 1330   GLUCOSEU >=500 (A) 09/12/2019 1330   HGBUR NEGATIVE 09/12/2019 1330   BILIRUBINUR NEGATIVE 09/12/2019 1330   KETONESUR NEGATIVE 09/12/2019 1330   PROTEINUR NEGATIVE 09/12/2019 1330   NITRITE NEGATIVE 09/12/2019 1330   LEUKOCYTESUR NEGATIVE 09/12/2019 1330    Last A1C:  Lab Results  Component Value Date   HGBA1C 7.2 (H) 09/23/2020     Radiologic Exams on Admission:   CT ABDOMEN PELVIS W CONTRAST Result Date: 08/05/2024 CLINICAL DATA:  Abdominal pain.  Vomiting. EXAM: CT ABDOMEN AND PELVIS WITH CONTRAST TECHNIQUE: Multidetector CT imaging of the abdomen and pelvis was performed using the standard protocol following bolus administration of intravenous contrast. RADIATION DOSE  REDUCTION: This exam was performed according to the departmental dose-optimization program which includes automated exposure control, adjustment of the mA and/or kV according to patient size and/or use of iterative reconstruction technique. CONTRAST:  75mL OMNIPAQUE  IOHEXOL  350 MG/ML SOLN COMPARISON:  09/24/2017 FINDINGS: Lower chest: Dependent atelectasis noted in the lung bases. Hepatobiliary: Markedly nodular liver contour is compatible with cirrhosis, new in the interval. Gallbladder is markedly distended with some ill definition of the gallbladder wall but no evidence for gallstones. No intrahepatic or extrahepatic biliary dilation. Pancreas: No focal mass lesion. No dilatation of the main duct. No intraparenchymal cyst. No peripancreatic edema. Spleen: No splenomegaly. No suspicious focal mass lesion. Adrenals/Urinary Tract: No adrenal nodule or mass. Kidneys unremarkable. No evidence for hydroureter. The urinary bladder appears normal for the degree of distention. Stomach/Bowel: Stomach is unremarkable. No gastric wall thickening. No evidence of outlet obstruction. Duodenum is normally positioned as is the ligament of Treitz. No small bowel wall thickening. No small bowel dilatation. The terminal ileum is normal. The appendix is normal. No gross colonic mass. No colonic wall thickening. Diverticular changes are noted in the left colon without evidence of diverticulitis. 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 Extensive collateralization noted in the left upper quadrant. Paraumbilical vein is been recanalized. No abdominal aortic aneurysm. No abdominal aortic atherosclerotic calcification. There is no gastrohepatic or hepatoduodenal ligament lymphadenopathy. No retroperitoneal or mesenteric lymphadenopathy. No pelvic sidewall lymphadenopathy. Reproductive: The prostate gland and seminal vesicles are unremarkable. Other: No substantial intraperitoneal free fluid.  Musculoskeletal: No worrisome lytic or sclerotic osseous abnormality. IMPRESSION: 1. Markedly nodular liver contour is compatible with cirrhosis, new in the interval. Associated vascular sequelae of portal venous hypertension. 2. Markedly distended gallbladder with some ill definition of the gallbladder wall but no evidence for gallstones. If there is clinical concern for acute cholecystitis, right upper quadrant ultrasound could be used to further evaluate. 3. Left colonic diverticulosis without diverticulitis. Electronically Signed   By: Camellia Candle M.D.   On: 08/05/2024 05:52    EKG:   Independently reviewed.  Orders placed or performed during the hospital encounter of 08/05/24   EKG 12-Lead   ---------------------------------------------------------------------------------------------------------------------------------------    Assessment / Plan:   Principal Problem:   Acute upper GI bleed Active Problems:   Liver cirrhosis (HCC)   Essential hypertension   DM II (diabetes mellitus, type II), controlled (HCC)   Alcohol abuse   Assessment and Plan: * Acute upper GI bleed Hemoptysis In the setting of chronic alcohol use/abuse, liver cirrhosis, -ruling out esophageal varices  versus tear -Monitoring H&H closely -N.p.o. -Continue IV fluid - Continue Protonix  4 mg twice daily, Carafate  - GI consulted, appreciate further evaluation recommendations   Alcohol abuse Patient reporting alcohol use for past 24 hours -Monitoring for withdrawal -To reduce craving, adding naltrexone  -Initiating CIWA protocol  DM II (diabetes mellitus, type II), controlled (HCC) Fairly controlled with last A1c 7.2 -withholding home medication of metformin  -Checking CBG, covering with SSI insulin  coverage  Essential hypertension Changing home medications of metoprolol  to carvedilol  25 mg twice daily (As recommended beta-blocker for patient with liver cirrhosis, with Varices over Propranolol and  metoprolol ) - As needed IV hydralazine   Liver cirrhosis (HCC) History of hepatic cirrhosis due to chronic alcohol use/abuse -Monitoring LFTs closely -Currently stable, with lipase of 20, AST 60, ALT of 35, INR 1.5     Consults called: Gastroenterologist -------------------------------------------------------------------------------------------------------------------------------------------- DVT prophylaxis:  Place TED hose Start: 08/05/24 0718 SCDs Start: 08/05/24 0717 SCDs Start: 08/05/24 0636   Code Status:   Code Status: Full Code   Admission status: Patient will be admitted as Observation, with a greater than 2 midnight length of stay. Level of care: Progressive   Family Communication:  none at bedside  (The above findings and plan of care has been discussed with patient in detail, the patient expressed understanding and agreement of above plan)  --------------------------------------------------------------------------------------------------------------------------------------------------  Disposition Plan:  Anticipated 1-2 days Status is: Observation The patient remains OBS appropriate and will d/c before 2 midnights.  ----------------------------------------------------------------------------------------------------------------------------------------------------  Time spent:  4  Min.  Was spent seeing and evaluating the patient, reviewing all medical records, drawn plan of care.  SIGNED: Adriana DELENA Grams, MD, FHM. FAAFP. [facility], Pager  (Please use [email or web address] to page/ or secure chat through epic) If 7PM-7AM, please contact night-coverage [email or web address],  08/05/2024, 7:34 AM     [1] No Known Allergies  "

## 2024-08-05 NOTE — Progress Notes (Signed)
 MD made aware of pt low BPs.

## 2024-08-05 NOTE — Assessment & Plan Note (Addendum)
 Changing home medications of metoprolol  to carvedilol  25 mg twice daily (As recommended beta-blocker for patient with liver cirrhosis, with Varices over Propranolol and metoprolol ) - As needed IV hydralazine 

## 2024-08-05 NOTE — ED Provider Notes (Signed)
 "  EMERGENCY DEPARTMENT AT [facility] Provider Note   CSN: [id number] Arrival date & time: 08/05/24  9685     Patient presents with: Emesis   Ian Pham is a 59 y.o. male.   59 yo M with a cc of nausea and vomiting.  This started yesterday morning.  He said he had 4 episodes where they looked bloody.  He has been able to drink since then but does not feel like eating.  He hoped that maybe it would go away and when it did not he decided to come in at 3 in the morning.  He denies any dark stool or blood in the stool.  Denies diarrhea.  Has pain diffusely about the abdomen.  No known sick contacts no recent travel.  A language interpreter was used.  Emesis      Prior to Admission medications  Medication Sig Start Date End Date Taking? Authorizing Provider  azithromycin  (ZITHROMAX ) 250 MG tablet Take first 2 tablets together, then 1 every day until finished. 10/13/23   Zelaya, Oscar A, PA-C  benzonatate  (TESSALON ) 100 MG capsule Take 1 capsule (100 mg total) by mouth every 8 (eight) hours. 10/13/23   Zelaya, Oscar A, PA-C  Blood Glucose Monitoring Suppl (TRUE METRIX METER) w/Device KIT Use as instructed. Check blood glucose level by fingerstick twice per day. 10/21/20   Fleming, Zelda W, NP  doxycycline  (VIBRAMYCIN ) 100 MG capsule Take 1 capsule (100 mg total) by mouth 2 (two) times daily. 08/10/23   Hildegard, Amjad, PA-C  glucose blood (TRUE METRIX BLOOD GLUCOSE TEST) test strip Use as instructed. Check blood glucose level by fingerstick twice per day. 10/21/20   Fleming, Zelda W, NP  meloxicam  (MOBIC ) 15 MG tablet Take 1 tablet (15 mg total) by mouth daily. 02/24/21   Jerrol Agent, MD  metaxalone  (SKELAXIN ) 800 MG tablet Take 1 tablet (800 mg total) by mouth 3 (three) times daily. 05/04/23   Dasie Faden, MD  metFORMIN  (GLUCOPHAGE ) 500 MG tablet Take 1 tablet (500 mg total) by mouth 2 (two) times daily with a meal. 10/21/20 11/20/20  Fleming, Zelda W, NP   metFORMIN  (GLUCOPHAGE ) 500 MG tablet TAKE 1 TABLET (500 MG TOTAL) BY MOUTH TWO TIMES DAILY WITH A MEAL. 09/27/20 09/27/21  Ghimire, Donalda HERO, MD  metFORMIN  (GLUCOPHAGE ) 500 MG tablet TAKE 1 TABLET (500 MG TOTAL) BY MOUTH 2 (TWO) TIMES DAILY WITH A MEAL. 10/21/20 10/21/21  Fleming, Zelda W, NP  methocarbamol  (ROBAXIN ) 500 MG tablet Take 1 tablet (500 mg total) by mouth 2 (two) times daily. 08/10/23   Hildegard Loge, PA-C  metoprolol  tartrate (LOPRESSOR ) 25 MG tablet Take 1 tablet (25 mg total) by mouth 2 (two) times daily. 10/21/20   Fleming, Zelda W, NP  metoprolol  tartrate (LOPRESSOR ) 25 MG tablet TAKE 1 TABLET (25 MG TOTAL) BY MOUTH TWO TIMES DAILY. 09/27/20 09/27/21  Ghimire, Donalda HERO, MD  metoprolol  tartrate (LOPRESSOR ) 25 MG tablet TAKE 1 TABLET (25 MG TOTAL) BY MOUTH 2 (TWO) TIMES DAILY. 10/21/20 10/21/21  Fleming, Zelda W, NP  TRUEplus Lancets 28G MISC Use as instructed. Check blood glucose level by fingerstick twice per day. 10/21/20   Fleming, Zelda W, NP    Allergies: Patient has no known allergies.    Review of Systems  Gastrointestinal:  Positive for vomiting.    Updated Vital Signs BP 118/66   Pulse 95   Temp 97.6 F (36.4 C)   Resp 19   SpO2 94%   Physical  Exam Vitals and nursing note reviewed.  Constitutional:      Appearance: He is well-developed.  HENT:     Head: Normocephalic and atraumatic.  Eyes:     Pupils: Pupils are equal, round, and reactive to light.  Neck:     Vascular: No JVD.  Cardiovascular:     Rate and Rhythm: Normal rate and regular rhythm.     Heart sounds: No murmur heard.    No friction rub. No gallop.  Pulmonary:     Effort: No respiratory distress.     Breath sounds: No wheezing.  Abdominal:     General: There is no distension.     Tenderness: There is no abdominal tenderness. There is no guarding or rebound.     Comments: Benign abdominal exam  Musculoskeletal:        General: Normal range of motion.     Cervical back: Normal range of motion and  neck supple.  Skin:    Coloration: Skin is not pale.     Findings: No rash.  Neurological:     Mental Status: He is alert and oriented to person, place, and time.  Psychiatric:        Behavior: Behavior normal.     (all labs ordered are listed, but only abnormal results are displayed) Labs Reviewed  CBC WITH DIFFERENTIAL/PLATELET - Abnormal; Notable for the following components:      Result Value   RBC 3.40 (*)    Hemoglobin 11.8 (*)    HCT 33.2 (*)    MCH 34.7 (*)    Platelets 86 (*)    All other components within normal limits  COMPREHENSIVE METABOLIC PANEL WITH GFR - Abnormal; Notable for the following components:   Glucose, Bld 155 (*)    BUN 28 (*)    Albumin 3.0 (*)    AST 60 (*)    Alkaline Phosphatase 213 (*)    Total Bilirubin 4.1 (*)    All other components within normal limits  PROTIME-INR - Abnormal; Notable for the following components:   Prothrombin Time 18.7 (*)    INR 1.5 (*)    All other components within normal limits  LIPASE, BLOOD  ETHANOL  TYPE AND SCREEN  ABO/RH    EKG: None  Radiology: CT ABDOMEN PELVIS W CONTRAST Result Date: 08/05/2024 CLINICAL DATA:  Abdominal pain.  Vomiting. EXAM: CT ABDOMEN AND PELVIS WITH CONTRAST TECHNIQUE: Multidetector CT imaging of the abdomen and pelvis was performed using the standard protocol following bolus administration of intravenous contrast. RADIATION DOSE REDUCTION: This exam was performed according to the departmental dose-optimization program which includes automated exposure control, adjustment of the mA and/or kV according to patient size and/or use of iterative reconstruction technique. CONTRAST:  75mL OMNIPAQUE  IOHEXOL  350 MG/ML SOLN COMPARISON:  09/24/2017 FINDINGS: Lower chest: Dependent atelectasis noted in the lung bases. Hepatobiliary: Markedly nodular liver contour is compatible with cirrhosis, new in the interval. Gallbladder is markedly distended with some ill definition of the gallbladder wall but  no evidence for gallstones. No intrahepatic or extrahepatic biliary dilation. Pancreas: No focal mass lesion. No dilatation of the main duct. No intraparenchymal cyst. No peripancreatic edema. Spleen: No splenomegaly. No suspicious focal mass lesion. Adrenals/Urinary Tract: No adrenal nodule or mass. Kidneys unremarkable. No evidence for hydroureter. The urinary bladder appears normal for the degree of distention. Stomach/Bowel: Stomach is unremarkable. No gastric wall thickening. No evidence of outlet obstruction. Duodenum is normally positioned as is the ligament of Treitz. No small bowel  wall thickening. No small bowel dilatation. The terminal ileum is normal. The appendix is normal. No gross colonic mass. No colonic wall thickening. Diverticular changes are noted in the left colon without evidence of diverticulitis. 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 Extensive collateralization noted in the left upper quadrant. Paraumbilical vein is been recanalized. No abdominal aortic aneurysm. No abdominal aortic atherosclerotic calcification. There is no gastrohepatic or hepatoduodenal ligament lymphadenopathy. No retroperitoneal or mesenteric lymphadenopathy. No pelvic sidewall lymphadenopathy. Reproductive: The prostate gland and seminal vesicles are unremarkable. Other: No substantial intraperitoneal free fluid. Musculoskeletal: No worrisome lytic or sclerotic osseous abnormality. IMPRESSION: 1. Markedly nodular liver contour is compatible with cirrhosis, new in the interval. Associated vascular sequelae of portal venous hypertension. 2. Markedly distended gallbladder with some ill definition of the gallbladder wall but no evidence for gallstones. If there is clinical concern for acute cholecystitis, right upper quadrant ultrasound could be used to further evaluate. 3. Left colonic diverticulosis without diverticulitis. Electronically Signed   By: Camellia Candle M.D.   On:  08/05/2024 05:52     Procedures   Medications Ordered in the ED  cefTRIAXone  (ROCEPHIN ) 2 g in sodium chloride  0.9 % 100 mL IVPB (2 g Intravenous New Bag/Given 08/05/24 0659)  acetaminophen  (TYLENOL ) tablet 650 mg (has no administration in time range)    Or  acetaminophen  (TYLENOL ) suppository 650 mg (has no administration in time range)  pantoprazole  (PROTONIX ) injection 40 mg (has no administration in time range)  ondansetron  (ZOFRAN ) injection 4 mg (has no administration in time range)  sodium chloride  0.9 % bolus 1,000 mL (1,000 mLs Intravenous New Bag/Given 08/05/24 0411)  metoCLOPramide  (REGLAN ) injection 10 mg (10 mg Intravenous Given 08/05/24 0404)  diphenhydrAMINE  (BENADRYL ) injection 25 mg (25 mg Intravenous Given 08/05/24 0403)  morphine  (PF) 4 MG/ML injection 4 mg (4 mg Intravenous Given 08/05/24 0402)  iohexol  (OMNIPAQUE ) 350 MG/ML injection 75 mL (75 mLs Intravenous Contrast Given 08/05/24 0514)  pantoprazole  (PROTONIX ) injection 40 mg (40 mg Intravenous Given 08/05/24 0657)                                    Medical Decision Making Amount and/or Complexity of Data Reviewed Labs: ordered. Radiology: ordered.  Risk Prescription drug management. Decision regarding hospitalization.   59 yo M with a chief complaints of nausea and vomiting.  He tells me that he had 4 episodes of bloody emesis that occurred yesterday morning.  He hoped that it would get better but still had some ongoing nausea and decided to come into the ED to be evaluated.  He has been able to drink some at home but has not really felt like eating.  Denies diarrhea.  Will obtain a laboratory evaluation.  Bolus of IV fluids.  Antiemetics.  Reassess.  Patient with hyperbilirubinemia, INR is mildly elevated.  I wonder if the patient has new liver dysfunction.  Will obtain CT imaging of the abdomen pelvis.   CT with concern for new cirrhosis.  Given protonix , rocephin .  Plan to admit to medicine.  Secure chat  message sent to Dr. Abran on call for [facility] per protocol.   The patients results and plan were reviewed and discussed.   Any x-rays performed were independently reviewed by myself.   Differential diagnosis were considered with the presenting HPI.  Medications  cefTRIAXone  (ROCEPHIN ) 2 g in sodium chloride  0.9 % 100 mL IVPB (2 g Intravenous  New Bag/Given 08/05/24 0659)  acetaminophen  (TYLENOL ) tablet 650 mg (has no administration in time range)    Or  acetaminophen  (TYLENOL ) suppository 650 mg (has no administration in time range)  pantoprazole  (PROTONIX ) injection 40 mg (has no administration in time range)  ondansetron  (ZOFRAN ) injection 4 mg (has no administration in time range)  sodium chloride  0.9 % bolus 1,000 mL (1,000 mLs Intravenous New Bag/Given 08/05/24 0411)  metoCLOPramide  (REGLAN ) injection 10 mg (10 mg Intravenous Given 08/05/24 0404)  diphenhydrAMINE  (BENADRYL ) injection 25 mg (25 mg Intravenous Given 08/05/24 0403)  morphine  (PF) 4 MG/ML injection 4 mg (4 mg Intravenous Given 08/05/24 0402)  iohexol  (OMNIPAQUE ) 350 MG/ML injection 75 mL (75 mLs Intravenous Contrast Given 08/05/24 0514)  pantoprazole  (PROTONIX ) injection 40 mg (40 mg Intravenous Given 08/05/24 0657)    Vitals:   08/05/24 0322 08/05/24 0415 08/05/24 0430 08/05/24 0445  BP: 128/74 121/66 120/68 118/66  Pulse: (!) 102 100 97 95  Resp: 16 18 19 19   Temp: 97.6 F (36.4 C)     SpO2: 95% 94% 95% 94%    Final diagnoses:  Upper GI bleed  Alcoholic cirrhosis of liver without ascites (HCC)    Admission/ observation were discussed with the admitting physician, patient and/or family and they are comfortable with the plan.        Final diagnoses:  Upper GI bleed  Alcoholic cirrhosis of liver without ascites [facility])    ED Discharge Orders     None          Emil Share, DO 08/05/24 9297  "

## 2024-08-06 ENCOUNTER — Encounter (HOSPITAL_COMMUNITY): Payer: Self-pay | Admitting: Gastroenterology

## 2024-08-06 DIAGNOSIS — R7989 Other specified abnormal findings of blood chemistry: Secondary | ICD-10-CM

## 2024-08-06 DIAGNOSIS — K921 Melena: Secondary | ICD-10-CM

## 2024-08-06 DIAGNOSIS — K7290 Hepatic failure, unspecified without coma: Secondary | ICD-10-CM

## 2024-08-06 DIAGNOSIS — K7682 Hepatic encephalopathy: Secondary | ICD-10-CM

## 2024-08-06 DIAGNOSIS — K922 Gastrointestinal hemorrhage, unspecified: Secondary | ICD-10-CM

## 2024-08-06 LAB — COMPREHENSIVE METABOLIC PANEL WITH GFR
ALT: 31 U/L (ref 0–44)
AST: 66 U/L — ABNORMAL HIGH (ref 15–41)
Albumin: 2.6 g/dL — ABNORMAL LOW (ref 3.5–5.0)
Alkaline Phosphatase: 169 U/L — ABNORMAL HIGH (ref 38–126)
Anion gap: 11 (ref 5–15)
BUN: 30 mg/dL — ABNORMAL HIGH (ref 6–20)
CO2: 17 mmol/L — ABNORMAL LOW (ref 22–32)
Calcium: 8.2 mg/dL — ABNORMAL LOW (ref 8.9–10.3)
Chloride: 113 mmol/L — ABNORMAL HIGH (ref 98–111)
Creatinine, Ser: 0.85 mg/dL (ref 0.61–1.24)
GFR, Estimated: >60 mL/min
Glucose, Bld: 172 mg/dL — ABNORMAL HIGH (ref 70–99)
Potassium: 4.6 mmol/L (ref 3.5–5.1)
Sodium: 141 mmol/L (ref 135–145)
Total Bilirubin: 2.4 mg/dL — ABNORMAL HIGH (ref 0.0–1.2)
Total Protein: 7 g/dL (ref 6.5–8.1)

## 2024-08-06 LAB — CBC
HCT: 30.6 % — ABNORMAL LOW (ref 39.0–52.0)
Hemoglobin: 10.7 g/dL — ABNORMAL LOW (ref 13.0–17.0)
MCH: 35 pg — ABNORMAL HIGH (ref 26.0–34.0)
MCHC: 35 g/dL (ref 30.0–36.0)
MCV: 100 fL (ref 80.0–100.0)
Platelets: 71 K/uL — ABNORMAL LOW (ref 150–400)
RBC: 3.06 MIL/uL — ABNORMAL LOW (ref 4.22–5.81)
RDW: 15.3 % (ref 11.5–15.5)
WBC: 6.3 K/uL (ref 4.0–10.5)
nRBC: 0 % (ref 0.0–0.2)

## 2024-08-06 LAB — PROTIME-INR
INR: 1.5 — ABNORMAL HIGH (ref 0.8–1.2)
Prothrombin Time: 18.6 s — ABNORMAL HIGH (ref 11.4–15.2)

## 2024-08-06 LAB — GLUCOSE, CAPILLARY
Glucose-Capillary: 149 mg/dL — ABNORMAL HIGH (ref 70–99)
Glucose-Capillary: 151 mg/dL — ABNORMAL HIGH (ref 70–99)
Glucose-Capillary: 141 mg/dL — ABNORMAL HIGH (ref 70–99)
Glucose-Capillary: 135 mg/dL — ABNORMAL HIGH (ref 70–99)
Glucose-Capillary: 93 mg/dL (ref 70–99)

## 2024-08-06 LAB — HEMOGLOBIN AND HEMATOCRIT, BLOOD
HCT: 28.7 % — ABNORMAL LOW (ref 39.0–52.0)
Hemoglobin: 10 g/dL — ABNORMAL LOW (ref 13.0–17.0)

## 2024-08-06 LAB — AMMONIA: Ammonia: 110 umol/L — ABNORMAL HIGH (ref 9–35)

## 2024-08-06 LAB — APTT: aPTT: 30 s (ref 24–36)

## 2024-08-06 MED ORDER — STERILE WATER FOR INJECTION IV SOLN
INTRAVENOUS | Status: DC
  Filled 2024-08-06: qty 1000
  Filled 2024-08-06 (×2): qty 150

## 2024-08-06 MED ORDER — LACTULOSE ENEMA
300.0000 mL | Freq: Two times a day (BID) | ORAL | Status: DC | PRN
  Administered 2024-08-07 – 2024-08-08 (×2): 300 mL via RECTAL
  Filled 2024-08-06 (×3): qty 300

## 2024-08-06 MED ORDER — RIFAXIMIN 550 MG PO TABS
550.0000 mg | ORAL_TABLET | Freq: Two times a day (BID) | ORAL | Status: DC
  Administered 2024-08-06 – 2024-08-11 (×8): 550 mg via ORAL
  Filled 2024-08-06 (×9): qty 1

## 2024-08-06 MED ORDER — SODIUM CHLORIDE 0.9 % IV SOLN
1.0000 g | INTRAVENOUS | Status: AC
  Administered 2024-08-07 – 2024-08-11 (×5): 1 g via INTRAVENOUS
  Filled 2024-08-06 (×5): qty 10

## 2024-08-06 NOTE — Plan of Care (Signed)
  Problem: Metabolic: Goal: Ability to maintain appropriate glucose levels will improve Outcome: Progressing   Problem: Skin Integrity: Goal: Risk for impaired skin integrity will decrease Outcome: Progressing   Problem: Clinical Measurements: Goal: Ability to maintain clinical measurements within normal limits will improve Outcome: Progressing Goal: Will remain free from infection Outcome: Progressing Goal: Diagnostic test results will improve Outcome: Progressing Goal: Respiratory complications will improve Outcome: Progressing Goal: Cardiovascular complication will be avoided Outcome: Progressing   Problem: Elimination: Goal: Will not experience complications related to bowel motility Outcome: Progressing Goal: Will not experience complications related to urinary retention Outcome: Progressing

## 2024-08-06 NOTE — Progress Notes (Addendum)
 " PROGRESS NOTE  Ian Pham  FMW:[id number] DOB: 1966/03/19 DOA: 08/05/2024 PCP: Pcp, No   Brief Narrative: Patient is a 59 year old male with history of chronic alcoholism, type 2  diabetes, hypertension, liver cirrhosis who presented with multiple episodes of hematemesis, diffuse abdominal pain.  Hemoglobin was 11.8 on presentation, mild elevated liver enzymes, elevated ammonia level.  He was encephalopathic on presentation.  CT abdomen/pelvis showed nodular liver contour, cirrhosis, distended gallbladder without evidence of stones.  GI consulted.  Underwent EGD with finding of grade 2 esophageal varices, type II gastric varices, nonbleeding gastric ulcer, alcoholic gastritis.Status post banding.  Assessment & Plan:  Principal Problem:   Acute upper GI bleed Active Problems:   Liver cirrhosis (HCC)   Essential hypertension   DM II (diabetes mellitus, type II), controlled (HCC)   Alcohol abuse   Gastric ulcer with hemorrhage   Encephalopathy, hepatic (HCC)   Secondary esophageal varices with bleeding (HCC)  Acute upper GI bleed/hematemesis: History of chronic alcohol use, liver cirrhosis.  Hemoglobin was stable on presentation.  Underwent EGD with finding of grade 2 esophageal varices, type II gastric varices, nonbleeding gastric ulcer, alcoholic gastritis.Status post banding.  Continue PPI.  Continue ceftriaxone , octreotide . Started on clear liquid.  Also on Coreg   Elevated ammonia level/hepatic encephalopathy: Has history of alcoholic liver cirrhosis.  Continue lactulose , rifaximin .  Might need eventual IR evaluation for consideration of TIPS given presence of large gastric varices on nonurgent basis  Non-anion gap metabolic acidosis: Continue gentle bicarb drip today.  Check renal function tomorrow.  Chronic alcohol abuse: Noted to be withdrawing on admission.  Continue CIWA protocol.  Also added naltrexone .  Continue thiamine  and folic acid   Type 2 diabetes:  Recent A1c of 7.2.  Takes metformin  at home.  Currently on sliding scale  Hypertension: Takes metoprolol  at home, changed to Coreg   Liver cirrhosis: Needs to follow-up with hepatology/GI as an outpatient.  Mildly elevated liver enzymes.  Morbid obesity: BMI of 31  He lives with his wife            DVT prophylaxis:Place TED hose Start: 08/05/24 0718 SCDs Start: 08/05/24 0717 SCDs Start: 08/05/24 0636     Code Status: Full Code  Family Communication: Called and discussed with wife Elaine on phone on 1/11  Patient status:Inpatient  Patient is from :home  Anticipated discharge un:ynfz  Estimated DC date:2-3 days   Consultants: GI  Procedures:EGD  Antimicrobials:  Anti-infectives (From admission, onward)    Start     Dose/Rate Route Frequency Ordered Stop   08/06/24 0700  cefTRIAXone  (ROCEPHIN ) 2 g in sodium chloride  0.9 % 100 mL IVPB        2 g 200 mL/hr over 30 Minutes Intravenous Every 24 hours 08/05/24 1006     08/05/24 0615  cefTRIAXone  (ROCEPHIN ) 2 g in sodium chloride  0.9 % 100 mL IVPB        2 g 200 mL/hr over 30 Minutes Intravenous  Once 08/05/24 0606 08/05/24 0729       Subjective: Patient seen and examined at bedside today.  Sitter at bedside too.  Overall appears comfortable.  Lying in bed.  Communicated with the help of video Spanish interpreter.  He complains of headache.  He told me correct month.  Knows that he is in the hospital.  Does not appear to be in acute distress.  Still on alcohol withdrawal, slightly confused.  Objective: Vitals:   08/06/24 0000 08/06/24 0442 08/06/24 0607 08/06/24 0717  BP: (!) 133/103  118/71  123/60  Pulse: 82 84  80  Resp: 18 18  19   Temp:  97.6 F (36.4 C)  98 F (36.7 C)  TempSrc: Axillary Oral  Oral  SpO2: 98% 94%  96%  Weight:   84.4 kg     Intake/Output Summary (Last 24 hours) at 08/06/2024 0736 Last data filed at 08/05/2024 2236 Gross per 24 hour  Intake 1590.76 ml  Output --  Net 1590.76 ml    Filed Weights   08/06/24 9392  Weight: 84.4 kg    Examination:  General exam: Obese, lying in bed, anxious HEENT: PERRL Respiratory system:  no wheezes or crackles  Cardiovascular system: Sinus tachycardia.  Gastrointestinal system: Abdomen is nondistended, soft and nontender. Central nervous system: Alert and awake, oriented to place, tells correct month Extremities: No edema, no clubbing ,no cyanosis Skin: No rashes, no ulcers,no icterus     Data Reviewed: I have personally reviewed following labs and imaging studies  CBC: Recent Labs  Lab 08/05/24 0332 08/05/24 1606 08/05/24 2005 08/06/24 0009 08/06/24 0317  WBC 8.5  --   --   --  6.3  NEUTROABS 5.3  --   --   --   --   HGB 11.8* 9.9* 10.1* 10.0* 10.7*  HCT 33.2* 29.0* 28.9* 28.7* 30.6*  MCV 97.6  --   --   --  100.0  PLT 86*  --   --   --  71*   Basic Metabolic Panel: Recent Labs  Lab 08/05/24 0332 08/06/24 0317  NA 140 141  K 3.8 4.6  CL 107 113*  CO2 23 17*  GLUCOSE 155* 172*  BUN 28* 30*  CREATININE 0.69 0.85  CALCIUM 8.9 8.2*  MG 2.1  --   PHOS 2.6  --      No results found for this or any previous visit (from the past 240 hours).   Radiology Studies: CT ABDOMEN PELVIS W CONTRAST Result Date: 08/05/2024 CLINICAL DATA:  Abdominal pain.  Vomiting. EXAM: CT ABDOMEN AND PELVIS WITH CONTRAST TECHNIQUE: Multidetector CT imaging of the abdomen and pelvis was performed using the standard protocol following bolus administration of intravenous contrast. RADIATION DOSE REDUCTION: This exam was performed according to the departmental dose-optimization program which includes automated exposure control, adjustment of the mA and/or kV according to patient size and/or use of iterative reconstruction technique. CONTRAST:  75mL OMNIPAQUE  IOHEXOL  350 MG/ML SOLN COMPARISON:  09/24/2017 FINDINGS: Lower chest: Dependent atelectasis noted in the lung bases. Hepatobiliary: Markedly nodular liver contour is compatible with  cirrhosis, new in the interval. Gallbladder is markedly distended with some ill definition of the gallbladder wall but no evidence for gallstones. No intrahepatic or extrahepatic biliary dilation. Pancreas: No focal mass lesion. No dilatation of the main duct. No intraparenchymal cyst. No peripancreatic edema. Spleen: No splenomegaly. No suspicious focal mass lesion. Adrenals/Urinary Tract: No adrenal nodule or mass. Kidneys unremarkable. No evidence for hydroureter. The urinary bladder appears normal for the degree of distention. Stomach/Bowel: Stomach is unremarkable. No gastric wall thickening. No evidence of outlet obstruction. Duodenum is normally positioned as is the ligament of Treitz. No small bowel wall thickening. No small bowel dilatation. The terminal ileum is normal. The appendix is normal. No gross colonic mass. No colonic wall thickening. Diverticular changes are noted in the left colon without evidence of diverticulitis. 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 Extensive collateralization noted in the left upper quadrant. Paraumbilical vein is been recanalized. No abdominal aortic aneurysm. No abdominal  aortic atherosclerotic calcification. There is no gastrohepatic or hepatoduodenal ligament lymphadenopathy. No retroperitoneal or mesenteric lymphadenopathy. No pelvic sidewall lymphadenopathy. Reproductive: The prostate gland and seminal vesicles are unremarkable. Other: No substantial intraperitoneal free fluid. Musculoskeletal: No worrisome lytic or sclerotic osseous abnormality. IMPRESSION: 1. Markedly nodular liver contour is compatible with cirrhosis, new in the interval. Associated vascular sequelae of portal venous hypertension. 2. Markedly distended gallbladder with some ill definition of the gallbladder wall but no evidence for gallstones. If there is clinical concern for acute cholecystitis, right upper quadrant ultrasound could be used to further  evaluate. 3. Left colonic diverticulosis without diverticulitis. Electronically Signed   By: Camellia Candle M.D.   On: 08/05/2024 05:52    Scheduled Meds:  carvedilol   25 mg Oral BID WC   folic acid   1 mg Oral Daily   insulin  aspart  0-15 Units Subcutaneous TID WC   lactulose   30 g Oral TID   lactulose   300 mL Rectal BID   multivitamin with minerals  1 tablet Oral Daily   naltrexone   50 mg Oral Daily   pantoprazole  (PROTONIX ) IV  40 mg Intravenous Q12H   senna-docusate  1 tablet Oral QHS   sodium chloride  flush  3 mL Intravenous Q12H   sucralfate   1 g Oral TID WC & HS   thiamine   100 mg Oral Daily   Or   thiamine   100 mg Intravenous Daily   Continuous Infusions:  cefTRIAXone  (ROCEPHIN )  IV     octreotide  (SANDOSTATIN ) 500 mcg in sodium chloride  0.9 % 250 mL (2 mcg/mL) infusion 50 mcg/hr (08/06/24 0735)     LOS: 0 days   Ivonne Mustache, MD [facility] P1/05/2025, 7:36 AM  "

## 2024-08-06 NOTE — Progress Notes (Addendum)
 "    Daily Progress Note  DOA: 08/05/2024 Hospital Day: 2  Cc:  Cirrhosis, variceal bleed  Assessment and Plan:   59 yo male with   AUD Newly diagnosed decompensated cirrhosis with hepatic encephalopathy. Admitted with upper GI bleeding.  Multiple possible bleeding sources on EGD. Gastric ulcer with visible vessel , Grade II esophageal varices and Type I gastric varices  MELD 3.0: 15 at 08/06/2024  3:17 AM MELD-Na: 14 at 08/06/2024  3:17 AM MDF 20.2 ( with 15.2 PT control) on admit  Today:  *Attempted to connect with Spanish interpreter ( video) but internet is down so I couldn't communicate with patient today  Passing black stool today per Nurse Assistant in room, likely residual as hgb is stable. No further hematemesis. Patient is restless, thrashing about in bed, attempting to pull out IV. Etoh withdrawal?   Continue BID IV pantoprazole  Continue empiric antibiotics Continue Octreotide  Continue TID lactulose .  If patient requiring Ativan  per CIWA and becomes somnolent and unable to take PO lactulose  then can  as enema .   Start Rifaximin  BID Start Carvedilol  6.25 mg daily prior to discharge Spoke with Wife over phone yesterday and explained that patient will need to abstain from etoh indefinitely going forward.  Out labs to look for additional / competing causes of cirrhosis other than Etoh. Can update viral hepatitis studies at that time and check  HAV / HBV immunity status.  PUD Gastric ulcer , as above H.pylori stool antigen ( only been on PPI for 24 hours)  No NSAIDS  Chronic thrombocytopenia Possibly secondary to bone marrow suppression from chronic alcoholism.  No splenomegaly on CT scan   Abnormal gallbladder on CT scan Markedly distended gallbladder, no definite gallstones.  Patient is not tender on exam.  Normal white count.   Abnormal gallbladder on imaging Markedly distended gallbladder with some ill definition of thegallbladder wall but no evidence for  gallstones.  Principal Problem:   Acute upper GI bleed Active Problems:   Liver cirrhosis (HCC)   Essential hypertension   DM II (diabetes mellitus, type II), controlled (HCC)   Alcohol abuse   Gastric ulcer with hemorrhage   Encephalopathy, hepatic (HCC)   Secondary esophageal varices with bleeding (HCC)    Objective  EGD 1/10 - Grade II esophageal varices. Completely eradicated. Banded. -  Type 2 gastroesophageal varices (GOV2, esophageal varices which extend along the fundus), without bleeding. - Non-bleeding gastric ulcer with a visible vessel. Injected. Treated with bipolar cautery. hemostatic spray applied. - Alcoholic gastritis. - Normal examined duodenum.  - No specimens collected.  - The patient had multiple possible bleeding sources. The pyloric ulcer had definitely recently bled and was the most likely source of the presenting GI bleed. The esophageal varices had some erythematous streaks which may have represented reflux esophagitis, but given their size and his presentation, banding was performed. - The large gastric varices did not have bleeding stigmata, but the mucosa in the gastric cardia was very erythematous and inflamed. Low suspicion that the patiet bled from them   Recent Labs    08/05/24 0332 08/05/24 1606 08/05/24 2005 08/06/24 0009 08/06/24 0317  WBC 8.5  --   --   --  6.3  HGB 11.8*   < > 10.1* 10.0* 10.7*  HCT 33.2*   < > 28.9* 28.7* 30.6*  MCV 97.6  --   --   --  100.0  PLT 86*  --   --   --  71*   < > =  values in this interval not displayed.   No results for input(s): FOLATE, VITAMINB12, FERRITIN, TIBC, IRONPCTSAT in the last 72 hours. Recent Labs    08/05/24 0332 08/06/24 0317  NA 140 141  K 3.8 4.6  CL 107 113*  CO2 23 17*  GLUCOSE 155* 172*  BUN 28* 30*  CREATININE 0.69 0.85  CALCIUM 8.9 8.2*     Imaging:  CT ABDOMEN PELVIS W CONTRAST CLINICAL DATA:  Abdominal pain.  Vomiting.  EXAM: CT ABDOMEN AND PELVIS WITH  CONTRAST  TECHNIQUE: Multidetector CT imaging of the abdomen and pelvis was performed using the standard protocol following bolus administration of intravenous contrast.  RADIATION DOSE REDUCTION: This exam was performed according to the departmental dose-optimization program which includes automated exposure control, adjustment of the mA and/or kV according to patient size and/or use of iterative reconstruction technique.  CONTRAST:  75mL OMNIPAQUE  IOHEXOL  350 MG/ML SOLN  COMPARISON:  09/24/2017  FINDINGS: Lower chest: Dependent atelectasis noted in the lung bases.  Hepatobiliary: Markedly nodular liver contour is compatible with cirrhosis, new in the interval. Gallbladder is markedly distended with some ill definition of the gallbladder wall but no evidence for gallstones. No intrahepatic or extrahepatic biliary dilation.  Pancreas: No focal mass lesion. No dilatation of the main duct. No intraparenchymal cyst. No peripancreatic edema.  Spleen: No splenomegaly. No suspicious focal mass lesion.  Adrenals/Urinary Tract: No adrenal nodule or mass. Kidneys unremarkable. No evidence for hydroureter. The urinary bladder appears normal for the degree of distention.  Stomach/Bowel: Stomach is unremarkable. No gastric wall thickening. No evidence of outlet obstruction. Duodenum is normally positioned as is the ligament of Treitz. No small bowel wall thickening. No small bowel dilatation. The terminal ileum is normal. The appendix is normal. No gross colonic mass. No colonic wall thickening. Diverticular changes are noted in the left colon without evidence of diverticulitis.  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 Extensive collateralization noted in the left upper quadrant. Paraumbilical vein is been recanalized. No abdominal aortic aneurysm. No abdominal aortic atherosclerotic calcification. There is no gastrohepatic or  hepatoduodenal ligament lymphadenopathy. No retroperitoneal or mesenteric lymphadenopathy. No pelvic sidewall lymphadenopathy.  Reproductive: The prostate gland and seminal vesicles are unremarkable.  Other: No substantial intraperitoneal free fluid.  Musculoskeletal: No worrisome lytic or sclerotic osseous abnormality.  IMPRESSION: 1. Markedly nodular liver contour is compatible with cirrhosis, new in the interval. Associated vascular sequelae of portal venous hypertension. 2. Markedly distended gallbladder with some ill definition of the gallbladder wall but no evidence for gallstones. If there is clinical concern for acute cholecystitis, right upper quadrant ultrasound could be used to further evaluate. 3. Left colonic diverticulosis without diverticulitis.  Electronically Signed   By: Camellia Candle M.D.   On: 08/05/2024 05:52     Scheduled inpatient medications:   carvedilol   25 mg Oral BID WC   folic acid   1 mg Oral Daily   insulin  aspart  0-15 Units Subcutaneous TID WC   lactulose   30 g Oral TID   lactulose   300 mL Rectal BID   multivitamin with minerals  1 tablet Oral Daily   naltrexone   50 mg Oral Daily   pantoprazole  (PROTONIX ) IV  40 mg Intravenous Q12H   rifaximin   550 mg Oral BID   senna-docusate  1 tablet Oral QHS   sodium chloride  flush  3 mL Intravenous Q12H   sucralfate   1 g Oral TID WC & HS   thiamine   100 mg Oral Daily  Or   thiamine   100 mg Intravenous Daily   Continuous inpatient infusions:   cefTRIAXone  (ROCEPHIN )  IV 2 g (08/06/24 0743)   octreotide  (SANDOSTATIN ) 500 mcg in sodium chloride  0.9 % 250 mL (2 mcg/mL) infusion 50 mcg/hr (08/06/24 0735)   PRN inpatient medications: acetaminophen  **OR** acetaminophen , hydrALAZINE , HYDROmorphone  (DILAUDID ) injection, ipratropium, LORazepam  **OR** LORazepam , methocarbamol , ondansetron  **OR** ondansetron  (ZOFRAN ) IV, oxyCODONE , zolpidem   Vital signs in last 24 hours: Temp:  [97.6 F (36.4 C)-98.1 F  (36.7 C)] 98 F (36.7 C) (01/11 0717) Pulse Rate:  [65-97] 80 (01/11 0717) Resp:  [13-22] 19 (01/11 0717) BP: (94-133)/(49-103) 123/60 (01/11 0717) SpO2:  [92 %-100 %] 96 % (01/11 0717) Weight:  [84.4 kg] 84.4 kg (01/11 0607) Last BM Date : 08/06/24  Intake/Output Summary (Last 24 hours) at 08/06/2024 1111 Last data filed at 08/05/2024 2236 Gross per 24 hour  Intake 1590.76 ml  Output --  Net 1590.76 ml    Intake/Output from previous day: 01/10 0701 - 01/11 0700 In: 2683.1 [I.V.:1340.6; IV Piggyback:1342.5] Out: -  Intake/Output this shift: No intake/output data recorded.   Physical Exam:  General: Alert , restless in NAD Heart:  Regular rate and rhythm.  Pulmonary: Normal respiratory effort Abdomen: Soft, nondistended, nontender. Normal bowel sounds. Extremities: No lower extremity edema  Neurologic: Alert . Unable to assess for asterixis , will not participate in exam     LOS: 0 days   Vina Dasen ,NP 08/06/2024, 11:11 AM  -----------------------------------------------------------------------------------------------------------  I have taken a history, reviewed the chart and examined the patient. I performed a substantive portion of this encounter, including complete performance of at least one of the key components, in conjunction with the APP. I agree with the APP's note, impression and recommendations  Patient was quite somnolent and encephalopathic this morning, but has become more agitated this afternoon.  Agitation likely represents early alcohol withdrawal.  CIWA protocol started.    Difficult to manage both hepatic encephalopathy and withdrawal simultaneously.  Currently patient is taking oral lactulose  and rifaximin , not requiring enemas, but if patient requires significant sedation because of agitation/withdrawal, enemas may be again required.  Important not to hold lactulose  due to sedation. Ammonia was significantly elevated 145, slightly improved to 110.   Would recommend trending ammonia level given multiple factors contributing to his altered mental status (encephalopathy, withdrawal).  Patient passed a dark stool today, but his hemoglobin was stable.  BUN still elevated. Patient at high risk for rebleeding from gastric ulcer with stigmata and gastric and esophageal varices.  Keep patient on clear liquid diet for today, continue PPI, octreotide , empiric antibiotics.  Patient has a large gastric varices, but these were not felt to be the source of his hematemesis.  Should patient have rebleeding, repeat upper endoscopy would be recommended to reassess for evidence of bleeding source (ulcer versus varices).  If varices are felt to be bleeding source, would recommend consideration for TIPS.  Dr. Charlanne will be taking over inpatient GI responsibilities tomorrow.   Helmer Dull E. Stacia, MD [facility]  Moderate complex medical decision making (this includes chart review, review of results, face-to-face time used for counseling as well as treatment plan and follow-up. The patient was provided an opportunity to ask questions and all were answered. The patient agreed with the plan and demonstrated an understanding of the instructions     "

## 2024-08-06 NOTE — Plan of Care (Signed)

## 2024-08-06 NOTE — Progress Notes (Addendum)
 Patient remained confused and agitated throughout the day, was not able to eat and drink, CIWA monitored and Ativan  given as needed, he is been trying to pull the lines and EKG monitors, removed 1 IV access. Had BM more than 10 times today, stool is black and watery, GI MD made aware, stool sample sent for H pylori, patient has a recruitment consultant in the room, high risk for fall due to confusion and poor judgement, spoke with the patient's wife in the morning and updated. POC continues. MD made aware regarding increased confusion.

## 2024-08-07 ENCOUNTER — Other Ambulatory Visit (HOSPITAL_COMMUNITY): Payer: Self-pay

## 2024-08-07 DIAGNOSIS — F10939 Alcohol use, unspecified with withdrawal, unspecified: Secondary | ICD-10-CM

## 2024-08-07 DIAGNOSIS — I8500 Esophageal varices without bleeding: Secondary | ICD-10-CM

## 2024-08-07 DIAGNOSIS — K7030 Alcoholic cirrhosis of liver without ascites: Secondary | ICD-10-CM

## 2024-08-07 DIAGNOSIS — I864 Gastric varices: Secondary | ICD-10-CM

## 2024-08-07 DIAGNOSIS — K254 Chronic or unspecified gastric ulcer with hemorrhage: Secondary | ICD-10-CM

## 2024-08-07 LAB — GLUCOSE, CAPILLARY
Glucose-Capillary: 125 mg/dL — ABNORMAL HIGH (ref 70–99)
Glucose-Capillary: 112 mg/dL — ABNORMAL HIGH (ref 70–99)
Glucose-Capillary: 105 mg/dL — ABNORMAL HIGH (ref 70–99)

## 2024-08-07 LAB — COMPREHENSIVE METABOLIC PANEL WITH GFR
ALT: 33 U/L (ref 0–44)
AST: 67 U/L — ABNORMAL HIGH (ref 15–41)
Albumin: 2.7 g/dL — ABNORMAL LOW (ref 3.5–5.0)
Alkaline Phosphatase: 153 U/L — ABNORMAL HIGH (ref 38–126)
Anion gap: 10 (ref 5–15)
BUN: 30 mg/dL — ABNORMAL HIGH (ref 6–20)
CO2: 21 mmol/L — ABNORMAL LOW (ref 22–32)
Calcium: 7.9 mg/dL — ABNORMAL LOW (ref 8.9–10.3)
Chloride: 111 mmol/L (ref 98–111)
Creatinine, Ser: 0.79 mg/dL (ref 0.61–1.24)
GFR, Estimated: >60 mL/min
Glucose, Bld: 144 mg/dL — ABNORMAL HIGH (ref 70–99)
Potassium: 3.4 mmol/L — ABNORMAL LOW (ref 3.5–5.1)
Sodium: 142 mmol/L (ref 135–145)
Total Bilirubin: 2.5 mg/dL — ABNORMAL HIGH (ref 0.0–1.2)
Total Protein: 6.5 g/dL (ref 6.5–8.1)

## 2024-08-07 LAB — CBC
HCT: 29 % — ABNORMAL LOW (ref 39.0–52.0)
Hemoglobin: 10.1 g/dL — ABNORMAL LOW (ref 13.0–17.0)
MCH: 34.7 pg — ABNORMAL HIGH (ref 26.0–34.0)
MCHC: 34.8 g/dL (ref 30.0–36.0)
MCV: 99.7 fL (ref 80.0–100.0)
Platelets: 77 K/uL — ABNORMAL LOW (ref 150–400)
RBC: 2.91 MIL/uL — ABNORMAL LOW (ref 4.22–5.81)
RDW: 15.2 % (ref 11.5–15.5)
WBC: 9.8 K/uL (ref 4.0–10.5)
nRBC: 0 % (ref 0.0–0.2)

## 2024-08-07 LAB — MAGNESIUM: Magnesium: 2.2 mg/dL (ref 1.7–2.4)

## 2024-08-07 LAB — AMMONIA: Ammonia: 69 umol/L — ABNORMAL HIGH (ref 9–35)

## 2024-08-07 LAB — PROTIME-INR
INR: 1.5 — ABNORMAL HIGH (ref 0.8–1.2)
Prothrombin Time: 19.3 s — ABNORMAL HIGH (ref 11.4–15.2)

## 2024-08-07 MED ORDER — POTASSIUM CHLORIDE CRYS ER 20 MEQ PO TBCR
40.0000 meq | EXTENDED_RELEASE_TABLET | Freq: Once | ORAL | Status: DC
  Filled 2024-08-07: qty 2

## 2024-08-07 NOTE — Anesthesia Preprocedure Evaluation (Signed)
"                                    Anesthesia Evaluation  Patient identified by MRN, date of birth, ID band Patient confused    Reviewed: Allergy & Precautions, Patient's Chart, lab work & pertinent test results, Unable to perform ROS - Chart review onlyPreop documentation limited or incomplete due to emergent nature of procedure.  Airway Mallampati: Unable to assess       Dental   Pulmonary former smoker   breath sounds clear to auscultation       Cardiovascular hypertension,  Rhythm:Regular     Neuro/Psych    GI/Hepatic PUD,,,(+)     substance abuse  alcohol useGI bleed   Endo/Other  diabetes    Renal/GU      Musculoskeletal   Abdominal   Peds  Hematology  (+) Blood dyscrasia, anemia   Anesthesia Other Findings   Reproductive/Obstetrics                              Anesthesia Physical Anesthesia Plan  ASA: 4 and emergent  Anesthesia Plan: General   Post-op Pain Management:    Induction: Intravenous, Cricoid pressure planned and Rapid sequence  PONV Risk Score and Plan: 2 and Ondansetron  and Dexamethasone   Airway Management Planned: Oral ETT  Additional Equipment:   Intra-op Plan:   Post-operative Plan: Possible Post-op intubation/ventilation  Informed Consent:      History available from chart only and Only emergency history available  Plan Discussed with: CRNA and Surgeon  Anesthesia Plan Comments:         Anesthesia Quick Evaluation  "

## 2024-08-07 NOTE — Progress Notes (Signed)
 Restraints in situ

## 2024-08-07 NOTE — Progress Notes (Signed)
 " PROGRESS NOTE  Ian Pham  FMW:[id number] DOB: Mar 10, 1966 DOA: 08/05/2024 PCP: Pcp, No   Brief Narrative: Patient is a 59 year old male with history of chronic alcoholism, type 2  diabetes, hypertension, liver cirrhosis who presented with multiple episodes of hematemesis, diffuse abdominal pain.  Hemoglobin was 11.8 on presentation, mild elevated liver enzymes, elevated ammonia level.  He was encephalopathic on presentation.  CT abdomen/pelvis showed nodular liver contour, cirrhosis, distended gallbladder without evidence of stones.  GI consulted.  Underwent EGD with finding of grade 2 esophageal varices, type II gastric varices, nonbleeding gastric ulcer, alcoholic gastritis.Status post banding.  Hospital course remarkable for persistent encephalopathy likely from alcohol withdrawal/hepatic encephalopathy.  Currently on CIWA protocol.  Assessment & Plan:  Principal Problem:   Acute upper GI bleed Active Problems:   Liver cirrhosis (HCC)   Essential hypertension   DM II (diabetes mellitus, type II), controlled (HCC)   Alcohol abuse   Gastric ulcer with hemorrhage   Encephalopathy, hepatic (HCC)   Secondary esophageal varices with bleeding (HCC)  Acute upper GI bleed/hematemesis: History of chronic alcohol use, liver cirrhosis.  Hemoglobin was stable on presentation.  Underwent EGD with finding of grade 2 esophageal varices, type II gastric varices, nonbleeding gastric ulcer, alcoholic gastritis.Status post banding.  Continue PPI.  Continue ceftriaxone , octreotide . Started on clear liquid.  Also on Coreg .  Hemoglobin stable  Elevated ammonia level/hepatic encephalopathy acute alcohol withdrawal: Has history of alcoholic liver cirrhosis.  Continue lactulose , rifaximin .  Might need eventual IR evaluation for consideration of TIPS given presence of large gastric varices on nonurgent basis.  Remains encephalopathic/sedated today.  Having regular bowel movements.  Continue CIWA  protocol but minimize narcotics because he is sedated.  Ammonia level better today.  If not able to take by mouth, continue lactulose  enema  Non-anion gap metabolic acidosis: Started on gentle bicarb drip.  Continue today  Chronic alcohol abuse: Noted to be withdrawing on admission.  Continue CIWA protocol.  Continue thiamine  and folic acid   Type 2 diabetes: Recent A1c of 7.2.  Takes metformin  at home.  Currently on sliding scale  Hypertension: Takes metoprolol  at home, changed to Coreg   Liver cirrhosis: Needs to follow-up with hepatology/GI as an outpatient.  Mildly elevated liver enzymes.  Morbid obesity: BMI of 31  He lives with his wife            DVT prophylaxis:Place TED hose Start: 08/05/24 0718 SCDs Start: 08/05/24 0717 SCDs Start: 08/05/24 0636     Code Status: Full Code  Family Communication: Called and discussed with wife Elaine on phone on 1/11 Called again today, call could not be connected  Patient status:Inpatient  Patient is from :home  Anticipated discharge un:ynfz  Estimated DC date:2-3 days   Consultants: GI  Procedures:EGD  Antimicrobials:  Anti-infectives (From admission, onward)    Start     Dose/Rate Route Frequency Ordered Stop   08/07/24 0700  cefTRIAXone  (ROCEPHIN ) 1 g in sodium chloride  0.9 % 100 mL IVPB        1 g 200 mL/hr over 30 Minutes Intravenous Every 24 hours 08/06/24 1424     08/06/24 1000  rifaximin  (XIFAXAN ) tablet 550 mg        550 mg Oral 2 times daily 08/06/24 0744     08/06/24 0700  cefTRIAXone  (ROCEPHIN ) 2 g in sodium chloride  0.9 % 100 mL IVPB  Status:  Discontinued        2 g 200 mL/hr over 30 Minutes Intravenous Every 24  hours 08/05/24 1006 08/06/24 1424   08/05/24 0615  cefTRIAXone  (ROCEPHIN ) 2 g in sodium chloride  0.9 % 100 mL IVPB        2 g 200 mL/hr over 30 Minutes Intravenous  Once 08/05/24 0606 08/05/24 0729       Subjective: Seen and examined at bedside today.  Lying in bed.  Hemodynamically  stable.  Hemoglobin stable.  No new episodes of hematochezia or melena.  Having bowel movements.  Very confused, sedated today.  Objective: Vitals:   08/07/24 0315 08/07/24 0342 08/07/24 0500 08/07/24 0807  BP: 120/62   103/87  Pulse: 74  63 76  Resp: 15   17  Temp: 98 F (36.7 C)   98.1 F (36.7 C)  TempSrc: Axillary   Axillary  SpO2: 100%   99%  Weight:  81 kg      Intake/Output Summary (Last 24 hours) at 08/07/2024 1109 Last data filed at 08/07/2024 0728 Gross per 24 hour  Intake 1776.63 ml  Output 1500 ml  Net 276.63 ml   Filed Weights   08/06/24 0607 08/07/24 0342  Weight: 84.4 kg 81 kg    Examination:  General exam: Sedated, lying on bed, obese HEENT: PERRL Respiratory system:  no wheezes or crackles  Cardiovascular system: S1 & S2 heard, RRR.  Gastrointestinal system: Abdomen is nondistended, soft and nontender. Central nervous system: Not alert or oriented Extremities: No edema, no clubbing ,no cyanosis Skin: No rashes, no ulcers,no icterus     Data Reviewed: I have personally reviewed following labs and imaging studies  CBC: Recent Labs  Lab 08/05/24 0332 08/05/24 1606 08/05/24 2005 08/06/24 0009 08/06/24 0317 08/07/24 0259  WBC 8.5  --   --   --  6.3 9.8  NEUTROABS 5.3  --   --   --   --   --   HGB 11.8* 9.9* 10.1* 10.0* 10.7* 10.1*  HCT 33.2* 29.0* 28.9* 28.7* 30.6* 29.0*  MCV 97.6  --   --   --  100.0 99.7  PLT 86*  --   --   --  71* 77*   Basic Metabolic Panel: Recent Labs  Lab 08/05/24 0332 08/06/24 0317 08/07/24 0259  NA 140 141 142  K 3.8 4.6 3.4*  CL 107 113* 111  CO2 23 17* 21*  GLUCOSE 155* 172* 144*  BUN 28* 30* 30*  CREATININE 0.69 0.85 0.79  CALCIUM 8.9 8.2* 7.9*  MG 2.1  --  2.2  PHOS 2.6  --   --      No results found for this or any previous visit (from the past 240 hours).   Radiology Studies: No results found.   Scheduled Meds:  carvedilol   25 mg Oral BID WC   folic acid   1 mg Oral Daily   insulin  aspart   0-15 Units Subcutaneous TID WC   lactulose   30 g Oral TID   multivitamin with minerals  1 tablet Oral Daily   naltrexone   50 mg Oral Daily   pantoprazole  (PROTONIX ) IV  40 mg Intravenous Q12H   potassium chloride   40 mEq Oral Once   rifaximin   550 mg Oral BID   senna-docusate  1 tablet Oral QHS   sodium chloride  flush  3 mL Intravenous Q12H   sucralfate   1 g Oral TID WC & HS   thiamine   100 mg Oral Daily   Or   thiamine   100 mg Intravenous Daily   Continuous Infusions:  cefTRIAXone  (ROCEPHIN )  IV  octreotide  (SANDOSTATIN ) 500 mcg in sodium chloride  0.9 % 250 mL (2 mcg/mL) infusion 50 mcg/hr (08/07/24 0544)   sodium bicarbonate  150 mEq in sterile water  1,150 mL infusion 75 mL/hr at 08/07/24 0542     LOS: 1 day   Ivonne Mustache, MD [facility] P1/06/2025, 11:09 AM  "

## 2024-08-07 NOTE — Plan of Care (Signed)

## 2024-08-07 NOTE — Anesthesia Postprocedure Evaluation (Signed)
"   Anesthesia Post Note  Patient: Ian Pham  Procedure(s) Performed: EGD (ESOPHAGOGASTRODUODENOSCOPY) EGD, WITH ARGON PLASMA COAGULATION SCLEROTHERAPY, VARICOSE VEINS, ESOPHAGUS BAND LIGATION, GASTRIC VARICES     Patient location during evaluation: PACU Anesthesia Type: General Level of consciousness: confused and patient cooperative Pain management: pain level controlled Vital Signs Assessment: post-procedure vital signs reviewed and stable Respiratory status: spontaneous breathing, nonlabored ventilation and respiratory function stable Cardiovascular status: blood pressure returned to baseline and stable Postop Assessment: no apparent nausea or vomiting Anesthetic complications: no   No notable events documented.                  [name]      "

## 2024-08-07 NOTE — Progress Notes (Addendum)
 "    [facility] Progress Note  CC:   Cirrhosis, variceal/GI bleed   Subjective:  Patient still in soft restraints.  Alert but pulling around and trying to pull on restraints, incoherent.  Per nurse, no bowel movement today, 1 overnight that was just a small amount of dark material on the linens.  They have orders for lactulose  enemas and Flexi-Seal, which they will proceed with shortly.  Objective:  Vital signs in last 24 hours: Temp:  [97 F (36.1 C)-98.2 F (36.8 C)] 97 F (36.1 C) (01/12 1111) Pulse Rate:  [62-99] 62 (01/12 1111) Resp:  [15-20] 16 (01/12 1111) BP: (103-159)/(62-100) 133/66 (01/12 1111) SpO2:  [92 %-100 %] 97 % (01/12 1111) Weight:  [81 kg] 81 kg (01/12 0342) Last BM Date : 08/06/24 General:  Alert, but  in NAD, has soft restraints and trying to pull around, not able to communicate/incoherent Heart:  Regular rate and rhythm; no murmurs Pulm:  CTAB. No W/R/R. Abdomen:  Soft, non-distended.  BS present.  Non-tender.  Intake/Output from previous day: 01/11 0701 - 01/12 0700 In: 1776.6 [I.V.:1776.6] Out: 900 [Urine:900] Intake/Output this shift: Total I/O In: -  Out: 600 [Urine:600]  Lab Results: Recent Labs    08/05/24 0332 08/05/24 1606 08/06/24 0009 08/06/24 0317 08/07/24 0259  WBC 8.5  --   --  6.3 9.8  HGB 11.8*   < > 10.0* 10.7* 10.1*  HCT 33.2*   < > 28.7* 30.6* 29.0*  PLT 86*  --   --  71* 77*   < > = values in this interval not displayed.   BMET Recent Labs    08/05/24 0332 08/06/24 0317 08/07/24 0259  NA 140 141 142  K 3.8 4.6 3.4*  CL 107 113* 111  CO2 23 17* 21*  GLUCOSE 155* 172* 144*  BUN 28* 30* 30*  CREATININE 0.69 0.85 0.79  CALCIUM 8.9 8.2* 7.9*   LFT Recent Labs    08/07/24 0259  PROT 6.5  ALBUMIN 2.7*  AST 67*  ALT 33  ALKPHOS 153*  BILITOT 2.5*   PT/INR Recent Labs    08/05/24 0332 08/06/24 0317  LABPROT 18.7* 18.6*  INR 1.5* 1.5*   Assessment / Plan: Newly diagnosed decompensated  cirrhosis with hepatic encephalopathy. Admitted with upper GI bleeding.  Multiple possible bleeding sources on EGD. Gastric ulcer with visible vessel, Grade II esophageal varices and Type I gastric varices.   MELD 3.0: 15 at 08/07/2024  2:59 AM MELD-Na: 14 at 08/07/2024  2:59 AM Calculated from: Serum Creatinine: 0.79 mg/dL (Using min of 1 mg/dL) at 8/87/7973  7:40 AM Serum Sodium: 142 mmol/L (Using max of 137 mmol/L) at 08/07/2024  2:59 AM Total Bilirubin: 2.5 mg/dL at 8/87/7973  7:40 AM Serum Albumin: 2.7 g/dL at 8/87/7973  7:40 AM INR(ratio): 1.5 at 08/06/2024  3:17 AM Age at listing (hypothetical): 58 years Sex: Male at 08/07/2024  2:59 AM    Hgb stable at 10.1 grams this AM.  Ammonia down to 69.   Continue BID IV pantoprazole  Continue empiric antibiotics, ceftriaxone  (5 days total of abx) Continue Octreotide  x 72 hours. Continue TID lactulose  when able to take PO.  Lactulose  enemas and flexi-seal ordered for today. Continue Rifaximin  550 mg BID when able to take PO. Start Carvedilol  6.25 mg daily prior to discharge. Wife made aware that patient will need to abstain from etoh indefinitely going forward.  Monitor labs/Hgb.  I ordered and INR for today. Will order labs to look for  additional/competing causes of cirrhosis other than alcohol.  Will update viral hepatitis studies/immunity status as well.   PUD Gastric ulcer, as above H.pylori stool antigen in process (only been on PPI for 24 hours)  No NSAIDS   Chronic thrombocytopenia Possibly secondary to bone marrow suppression from chronic alcoholism.  No splenomegaly on CT scan.  Platelets 77K.   Abnormal gallbladder on CT scan Markedly distended gallbladder, no definite gallstones.  Patient is not tender on exam.  Normal white count.      LOS: 1 day   Ian Pham. Zehr  08/07/2024, 11:12 AM     Attending physician's note   I have taken history, reviewed the chart and examined the patient. I performed a substantive portion of  this encounter, including complete performance of at least one of the key components, in conjunction with the APP. I agree with the Advanced Practitioner's note, impression and recommendations.  Got signout from Dr. Stacia.  New ETOH decompensated liver cirrhosis with UGI bleed. EGD-gastric ulcer with visible vessel s/p endoscopic treatment, grade 2 nonbleeding EV s/p EVL, type I gastric varices.  No further bleeding. Hepatic encephalopathy No ascites. EtOH withdrawal.  Plan: - Continue octreotide /Protonix /ceftriaxone  as above - Lactulose  enemas for now.  Once able to take PO, start lactulose  TID PO with rifaximin  550mg  BID - Strict alcohol abstinence. - Labs to r/o any other causes of liver cirrhosis.  Check AFP. - Carvedilol  6.25 mg daily at discharge - Watch for any rebleeding. - Will follow along.   Anselm Bring, MD [facility] [phone]  "

## 2024-08-07 NOTE — Progress Notes (Signed)
 Soft wrist restraints in situ

## 2024-08-08 DIAGNOSIS — D696 Thrombocytopenia, unspecified: Secondary | ICD-10-CM

## 2024-08-08 DIAGNOSIS — K7469 Other cirrhosis of liver: Secondary | ICD-10-CM

## 2024-08-08 LAB — GLUCOSE, CAPILLARY
Glucose-Capillary: 106 mg/dL — ABNORMAL HIGH (ref 70–99)
Glucose-Capillary: 119 mg/dL — ABNORMAL HIGH (ref 70–99)
Glucose-Capillary: 157 mg/dL — ABNORMAL HIGH (ref 70–99)
Glucose-Capillary: 98 mg/dL (ref 70–99)

## 2024-08-08 LAB — COMPREHENSIVE METABOLIC PANEL WITH GFR
ALT: 46 U/L — ABNORMAL HIGH (ref 0–44)
AST: 108 U/L — ABNORMAL HIGH (ref 15–41)
Albumin: 2.8 g/dL — ABNORMAL LOW (ref 3.5–5.0)
Alkaline Phosphatase: 158 U/L — ABNORMAL HIGH (ref 38–126)
Anion gap: 10 (ref 5–15)
BUN: 23 mg/dL — ABNORMAL HIGH (ref 6–20)
CO2: 22 mmol/L (ref 22–32)
Calcium: 8.1 mg/dL — ABNORMAL LOW (ref 8.9–10.3)
Chloride: 106 mmol/L (ref 98–111)
Creatinine, Ser: 0.72 mg/dL (ref 0.61–1.24)
GFR, Estimated: >60 mL/min
Glucose, Bld: 106 mg/dL — ABNORMAL HIGH (ref 70–99)
Potassium: 3.7 mmol/L (ref 3.5–5.1)
Sodium: 138 mmol/L (ref 135–145)
Total Bilirubin: 3.3 mg/dL — ABNORMAL HIGH (ref 0.0–1.2)
Total Protein: 6.9 g/dL (ref 6.5–8.1)

## 2024-08-08 LAB — HEPATITIS A ANTIBODY, TOTAL: hep A Total Ab: REACTIVE — AB

## 2024-08-08 LAB — MAGNESIUM: Magnesium: 2.1 mg/dL (ref 1.7–2.4)

## 2024-08-08 LAB — IRON AND TIBC
Iron: 63 ug/dL (ref 45–182)
Saturation Ratios: 26 % (ref 17.9–39.5)
TIBC: 244 ug/dL — ABNORMAL LOW (ref 250–450)
UIBC: 180 ug/dL

## 2024-08-08 LAB — PHOSPHORUS: Phosphorus: 3.1 mg/dL (ref 2.5–4.6)

## 2024-08-08 LAB — FERRITIN: Ferritin: 217 ng/mL (ref 24–336)

## 2024-08-08 LAB — HEPATITIS B SURFACE ANTIGEN: Hepatitis B Surface Ag: NONREACTIVE

## 2024-08-08 LAB — HEPATITIS C ANTIBODY: HCV Ab: NONREACTIVE

## 2024-08-08 LAB — HEPATITIS B SURFACE ANTIBODY,QUALITATIVE: Hep B S Ab: NONREACTIVE

## 2024-08-08 LAB — AMMONIA: Ammonia: 54 umol/L — ABNORMAL HIGH (ref 9–35)

## 2024-08-08 NOTE — Progress Notes (Addendum)
 "    [facility] Progress Note  CC:  Cirrhosis, variceal/GI bleed   Subjective: Still very sleepy, and dozes off during most of the visit, but seems like he is awakening a little bit more today and per his nurse, he has been able to take his medications by mouth this morning.  He is off of soft restraints.  They had some watery return with the lactulose  enemas yesterday and he was able to take the lactulose  by mouth this morning.  No family at bedside.  Objective:  Vital signs in last 24 hours: Temp:  [97 F (36.1 C)-97.8 F (36.6 C)] 97.8 F (36.6 C) (01/13 0824) Pulse Rate:  [62-87] 70 (01/13 0905) Resp:  [16-22] 17 (01/13 0824) BP: (127-158)/(65-86) 150/79 (01/13 0905) SpO2:  [95 %-99 %] 95 % (01/13 0824) Weight:  [78.5 kg] 78.5 kg (01/13 0500) Last BM Date : 08/07/24 General:  Awakens but still very sleepy Heart:  Regular rate and rhythm; no murmurs Pulm:  CTAB.  No W/R/R. Abdomen:  Soft, non-distended.  BS present.  Non-tender. Extremities:  Without edema.  Intake/Output from previous day: 01/12 0701 - 01/13 0700 In: 1338.8 [I.V.:1238.8; IV Piggyback:100] Out: 2150 [Urine:2150]  Lab Results: Recent Labs    08/06/24 0009 08/06/24 0317 08/07/24 0259  WBC  --  6.3 9.8  HGB 10.0* 10.7* 10.1*  HCT 28.7* 30.6* 29.0*  PLT  --  71* 77*   BMET Recent Labs    08/06/24 0317 08/07/24 0259 08/08/24 0349  NA 141 142 138  K 4.6 3.4* 3.7  CL 113* 111 106  CO2 17* 21* 22  GLUCOSE 172* 144* 106*  BUN 30* 30* 23*  CREATININE 0.85 0.79 0.72  CALCIUM 8.2* 7.9* 8.1*   LFT Recent Labs    08/08/24 0349  PROT 6.9  ALBUMIN 2.8*  AST 108*  ALT 46*  ALKPHOS 158*  BILITOT 3.3*   PT/INR Recent Labs    08/06/24 0317 08/07/24 1138  LABPROT 18.6* 19.3*  INR 1.5* 1.5*   Hepatitis Panel Recent Labs    08/08/24 0349  HEPBSAG NON REACTIVE  HCVAB NON REACTIVE   Assessment / Plan: Newly diagnosed decompensated cirrhosis with hepatic encephalopathy.  Admitted with upper GI bleeding.  Multiple possible bleeding sources on EGD. Gastric ulcer with visible vessel, Grade II esophageal varices and Type I gastric varices.   MELD 3.0: 16 at 08/08/2024  3:49 AM MELD-Na: 15 at 08/08/2024  3:49 AM Calculated from: Serum Creatinine: 0.72 mg/dL (Using min of 1 mg/dL) at 8/86/7973  6:50 AM Serum Sodium: 138 mmol/L (Using max of 137 mmol/L) at 08/08/2024  3:49 AM Total Bilirubin: 3.3 mg/dL at 8/86/7973  6:50 AM Serum Albumin: 2.8 g/dL at 8/86/7973  6:50 AM INR(ratio): 1.5 at 08/07/2024 11:38 AM Age at listing (hypothetical): 58 years Sex: Male at 08/08/2024  3:49 AM  Hgb stable pending this AM.  Ammonia down to 54.   Continue BID IV pantoprazole  Continue empiric antibiotics, ceftriaxone  (5 days total of abx) Continue Octreotide  x 72 hours. Continue TID lactulose . Continue Rifaximin  550 mg BID. Start Carvedilol  6.25 mg daily prior to discharge. Wife made aware that patient will need to abstain from etoh indefinitely going forward.  Monitor labs/Hgb. Will follow-up on remaining labs to look for additional/competing causes of cirrhosis other than alcohol.  Will need Hep B vaccination as outpatient but has immunity to Hep A.   PUD Gastric ulcer, as above H.pylori stool antigen in process (only been on PPI for 24 hours)  No NSAIDS   Chronic thrombocytopenia Possibly secondary to bone marrow suppression from chronic alcoholism.  No splenomegaly on CT scan.  Platelets 77K.   Abnormal gallbladder on CT scan Markedly distended gallbladder, no definite gallstones.  Patient is not tender on exam.  Normal white count.   Follow-up appointment made with our office for next week, 08/17/2024.  Appointment is in his discharge information.    LOS: 2 days   Harlene BIRCH. Zehr  08/08/2024, 10:29 AM     Attending physician's note   I have taken history, reviewed the chart and examined the patient. I performed a substantive portion of this encounter, including  complete performance of at least one of the key components, in conjunction with the APP. I agree with the Advanced Practitioner's note, impression and recommendations.   New ETOH decompensated liver cirrhosis with UGI bleed. EGD-gastric ulcer with visible vessel s/p endoscopic treatment, grade 2 nonbleeding EV s/p EVL, type I gastric varices.  No further bleeding. MELD 3.0: 16. WU for any other etiology of liver cirrhosis in progress.  Immune to hepatitis A but not B. Hepatic encephalopathy No ascites. EtOH withdrawal.  Plan: - Protonix  40mg  po BID x 8 weeks, then every day indefinitely. - Continue lactulose  3 times daily.  Can titrate to 2-3 BMs per day. - Continue rifaximin  550 twice daily (if cost is not prohibitive) - At discharge, start Coreg  6.25 mg p.o. daily.  Would try to maximize it as outpatient - Strict alcohol abstinence. - FU stool for H. pylori antigen, AFP - FU GI as outpt.  Will arrange. - Will sign off for now.   Anselm Bring, MD [facility] [phone]  "

## 2024-08-08 NOTE — Progress Notes (Signed)
 " PROGRESS NOTE  Ian Pham  FMW:[id number] DOB: Feb 20, 1966 DOA: 08/05/2024 PCP: Pcp, No   Brief Narrative: Patient is a 59 year old male with history of chronic alcoholism, type 2  diabetes, hypertension, liver cirrhosis who presented with multiple episodes of hematemesis, diffuse abdominal pain.  Hemoglobin was 11.8 on presentation, mild elevated liver enzymes, elevated ammonia level.  He was encephalopathic on presentation.  CT abdomen/pelvis showed nodular liver contour, cirrhosis, distended gallbladder without evidence of stones.  GI consulted.  Underwent EGD with finding of grade 2 esophageal varices, type II gastric varices, nonbleeding gastric ulcer, alcoholic gastritis.Status post banding.  Hospital course remarkable for persistent encephalopathy likely from alcohol withdrawal/hepatic encephalopathy.  Currently on CIWA protocol.  Mentation slightly improved today.  PT assessment requested  Assessment & Plan:  Principal Problem:   Acute upper GI bleed Active Problems:   Liver cirrhosis (HCC)   Essential hypertension   DM II (diabetes mellitus, type II), controlled (HCC)   Alcohol abuse   Gastric ulcer with hemorrhage   Encephalopathy, hepatic (HCC)   Secondary esophageal varices with bleeding (HCC)  Acute upper GI bleed/hematemesis: History of chronic alcohol use, liver cirrhosis.  Hemoglobin was stable on presentation.  Underwent EGD with finding of grade 2 esophageal varices, type II gastric varices, nonbleeding gastric ulcer, alcoholic gastritis.Status post banding.  Continue PPI.  Continue ceftriaxone , finished the course of octreotide .  Also on Coreg .  Hemoglobin stable.  Continue monitoring.  Started on soft diet today.  Elevated ammonia level/hepatic encephalopathy acute alcohol withdrawal: Has history of alcoholic liver cirrhosis.  Continue lactulose , rifaximin .  Might need eventual IR evaluation for consideration of TIPS given presence of large gastric  varices on nonurgent basis. Having regular bowel movements.  Continue CIWA protocol but minimize narcotics to avoid oversedation.  Ammonia level better today.  If not able to take by mouth, continue lactulose  enema.    Non-anion gap metabolic acidosis: Started on gentle bicarb drip.  Resolved  Chronic alcohol abuse: Noted to be withdrawing on admission.  Continue CIWA protocol.  Continue thiamine  and folic acid   Type 2 diabetes: Recent A1c of 7.2.  Takes metformin  at home.  Currently on sliding scale.  Monitor blood sugars  Hypertension: Takes metoprolol  at home, changed to Coreg   Liver cirrhosis/elevated liver enzymes: Needs to follow-up with hepatology/GI as an outpatient.  Mildly elevated liver enzymes.  Hep A total antibody positive  Morbid obesity: BMI of 31  Debility/deconditioning: PT consulted he lives with his wife            DVT prophylaxis:Place TED hose Start: 08/05/24 0718 SCDs Start: 08/05/24 0717 SCDs Start: 08/05/24 0636     Code Status: Full Code  Family Communication: Called and discussed with wife Elaine on phone on 1/11   Patient status:Inpatient  Patient is from :home  Anticipated discharge un:ynfz  Estimated DC date:2-3 days   Consultants: GI  Procedures:EGD  Antimicrobials:  Anti-infectives (From admission, onward)    Start     Dose/Rate Route Frequency Ordered Stop   08/07/24 0700  cefTRIAXone  (ROCEPHIN ) 1 g in sodium chloride  0.9 % 100 mL IVPB        1 g 200 mL/hr over 30 Minutes Intravenous Every 24 hours 08/06/24 1424 08/11/24 2359   08/06/24 1000  rifaximin  (XIFAXAN ) tablet 550 mg        550 mg Oral 2 times daily 08/06/24 0744     08/06/24 0700  cefTRIAXone  (ROCEPHIN ) 2 g in sodium chloride  0.9 % 100 mL IVPB  Status:  Discontinued        2 g 200 mL/hr over 30 Minutes Intravenous Every 24 hours 08/05/24 1006 08/06/24 1424   08/05/24 0615  cefTRIAXone  (ROCEPHIN ) 2 g in sodium chloride  0.9 % 100 mL IVPB        2 g 200 mL/hr over  30 Minutes Intravenous  Once 08/05/24 0606 08/05/24 0729       Subjective: Patient seen and examined at bedside today.  He looks more comfortable today.  Awake and alert.  Still slightly confused but not sedated.  No further episodes of hematochezia or melena.  Anemia level continues to improve.  He appears very weak and deconditioned.  Plan to consult PT .  Started soft diet  Objective: Vitals:   08/08/24 0409 08/08/24 0500 08/08/24 0824 08/08/24 0905  BP: (!) 158/86  (!) 143/76 (!) 150/79  Pulse:    70  Resp: 19  17   Temp: 97.6 F (36.4 C)  97.8 F (36.6 C)   TempSrc: Axillary  Oral   SpO2: 95%  95%   Weight:  78.5 kg      Intake/Output Summary (Last 24 hours) at 08/08/2024 1119 Last data filed at 08/08/2024 0252 Gross per 24 hour  Intake 1338.83 ml  Output 1550 ml  Net -211.17 ml   Filed Weights   08/06/24 0607 08/07/24 0342 08/08/24 0500  Weight: 84.4 kg 81 kg 78.5 kg    Examination:  General exam: Lying in bed, obese HEENT: PERRL Respiratory system:  no wheezes or crackles  Cardiovascular system: S1 & S2 heard, RRR.  Gastrointestinal system: Abdomen is nondistended, soft and nontender. Central nervous system: Alert and awake, confused Extremities: No edema, no clubbing ,no cyanosis Skin: No rashes, no ulcers,no icterus      Data Reviewed: I have personally reviewed following labs and imaging studies  CBC: Recent Labs  Lab 08/05/24 0332 08/05/24 1606 08/05/24 2005 08/06/24 0009 08/06/24 0317 08/07/24 0259  WBC 8.5  --   --   --  6.3 9.8  NEUTROABS 5.3  --   --   --   --   --   HGB 11.8* 9.9* 10.1* 10.0* 10.7* 10.1*  HCT 33.2* 29.0* 28.9* 28.7* 30.6* 29.0*  MCV 97.6  --   --   --  100.0 99.7  PLT 86*  --   --   --  71* 77*   Basic Metabolic Panel: Recent Labs  Lab 08/05/24 0332 08/06/24 0317 08/07/24 0259 08/08/24 0349  NA 140 141 142 138  K 3.8 4.6 3.4* 3.7  CL 107 113* 111 106  CO2 23 17* 21* 22  GLUCOSE 155* 172* 144* 106*  BUN 28* 30*  30* 23*  CREATININE 0.69 0.85 0.79 0.72  CALCIUM 8.9 8.2* 7.9* 8.1*  MG 2.1  --  2.2 2.1  PHOS 2.6  --   --  3.1     No results found for this or any previous visit (from the past 240 hours).   Radiology Studies: No results found.   Scheduled Meds:  carvedilol   25 mg Oral BID WC   folic acid   1 mg Oral Daily   insulin  aspart  0-15 Units Subcutaneous TID WC   lactulose   30 g Oral TID   multivitamin with minerals  1 tablet Oral Daily   naltrexone   50 mg Oral Daily   pantoprazole  (PROTONIX ) IV  40 mg Intravenous Q12H   potassium chloride   40 mEq Oral Once   rifaximin   550 mg Oral BID   senna-docusate  1 tablet Oral QHS   sodium chloride  flush  3 mL Intravenous Q12H   sucralfate   1 g Oral TID WC & HS   thiamine   100 mg Oral Daily   Or   thiamine   100 mg Intravenous Daily   Continuous Infusions:  cefTRIAXone  (ROCEPHIN )  IV 1 g (08/08/24 9357)     LOS: 2 days   Ivonne Mustache, MD [facility] P1/13/2026, 11:19 AM  "

## 2024-08-09 LAB — CBC
HCT: 28.1 % — ABNORMAL LOW (ref 39.0–52.0)
Hemoglobin: 9.8 g/dL — ABNORMAL LOW (ref 13.0–17.0)
MCH: 34.6 pg — ABNORMAL HIGH (ref 26.0–34.0)
MCHC: 34.9 g/dL (ref 30.0–36.0)
MCV: 99.3 fL (ref 80.0–100.0)
Platelets: 63 K/uL — ABNORMAL LOW (ref 150–400)
RBC: 2.83 MIL/uL — ABNORMAL LOW (ref 4.22–5.81)
RDW: 15 % (ref 11.5–15.5)
WBC: 5.7 K/uL (ref 4.0–10.5)
nRBC: 0 % (ref 0.0–0.2)

## 2024-08-09 LAB — GLUCOSE, CAPILLARY
Glucose-Capillary: 109 mg/dL — ABNORMAL HIGH (ref 70–99)
Glucose-Capillary: 102 mg/dL — ABNORMAL HIGH (ref 70–99)
Glucose-Capillary: 261 mg/dL — ABNORMAL HIGH (ref 70–99)
Glucose-Capillary: 147 mg/dL — ABNORMAL HIGH (ref 70–99)
Glucose-Capillary: 248 mg/dL — ABNORMAL HIGH (ref 70–99)

## 2024-08-09 LAB — COMPREHENSIVE METABOLIC PANEL WITH GFR
ALT: 42 U/L (ref 0–44)
AST: 88 U/L — ABNORMAL HIGH (ref 15–41)
Albumin: 2.5 g/dL — ABNORMAL LOW (ref 3.5–5.0)
Alkaline Phosphatase: 137 U/L — ABNORMAL HIGH (ref 38–126)
Anion gap: 8 (ref 5–15)
BUN: 21 mg/dL — ABNORMAL HIGH (ref 6–20)
CO2: 21 mmol/L — ABNORMAL LOW (ref 22–32)
Calcium: 7.8 mg/dL — ABNORMAL LOW (ref 8.9–10.3)
Chloride: 109 mmol/L (ref 98–111)
Creatinine, Ser: 0.78 mg/dL (ref 0.61–1.24)
GFR, Estimated: >60 mL/min
Glucose, Bld: 113 mg/dL — ABNORMAL HIGH (ref 70–99)
Potassium: 3.9 mmol/L (ref 3.5–5.1)
Sodium: 137 mmol/L (ref 135–145)
Total Bilirubin: 3.5 mg/dL — ABNORMAL HIGH (ref 0.0–1.2)
Total Protein: 6 g/dL — ABNORMAL LOW (ref 6.5–8.1)

## 2024-08-09 LAB — ANTI-SMOOTH MUSCLE ANTIBODY, IGG: F-Actin IgG: 12 U (ref 0–19)

## 2024-08-09 LAB — AFP TUMOR MARKER: AFP, Serum, Tumor Marker: 8 ng/mL (ref 0.0–8.4)

## 2024-08-09 LAB — IGG: IgG (Immunoglobin G), Serum: 2045 mg/dL — ABNORMAL HIGH (ref 603–1613)

## 2024-08-09 LAB — ANA W/REFLEX IF POSITIVE: Anti Nuclear Antibody (ANA): NEGATIVE

## 2024-08-09 LAB — ALPHA-1-ANTITRYPSIN: A-1 Antitrypsin, Ser: 135 mg/dL (ref 101–187)

## 2024-08-09 LAB — IGA: IgA: 1003 mg/dL — ABNORMAL HIGH (ref 90–386)

## 2024-08-09 LAB — H. PYLORI ANTIGEN, STOOL: H. Pylori Stool Ag, Eia: POSITIVE — AB

## 2024-08-09 LAB — MITOCHONDRIAL ANTIBODIES: Mitochondrial M2 Ab, IgG: <20 U (ref 0.0–20.0)

## 2024-08-09 LAB — TISSUE TRANSGLUTAMINASE, IGA: Tissue Transglutaminase Ab, IgA: <2 U/mL (ref 0–3)

## 2024-08-09 LAB — CERULOPLASMIN: Ceruloplasmin: 20.6 mg/dL (ref 16.0–31.0)

## 2024-08-09 MED ORDER — THIAMINE HCL 100 MG/ML IJ SOLN
500.0000 mg | INTRAVENOUS | Status: DC
  Administered 2024-08-09 – 2024-08-10 (×2): 500 mg via INTRAVENOUS
  Filled 2024-08-09: qty 5
  Filled 2024-08-09: qty 500
  Filled 2024-08-09: qty 480

## 2024-08-09 MED ORDER — CARVEDILOL 6.25 MG PO TABS
6.2500 mg | ORAL_TABLET | Freq: Two times a day (BID) | ORAL | Status: DC
  Administered 2024-08-09: 6.25 mg via ORAL
  Filled 2024-08-09 (×2): qty 1

## 2024-08-09 NOTE — Progress Notes (Signed)
 " PROGRESS NOTE  Ian Pham  FMW:[id number] DOB: [birth year] DOA: 08/05/2024 PCP: Pcp, No   Brief Narrative: Patient is a 59 year old male with history of chronic alcoholism, type 2  diabetes, hypertension, liver cirrhosis who presented with multiple episodes of hematemesis, diffuse abdominal pain.  Hemoglobin was 11.8 on presentation, mild elevated liver enzymes, elevated ammonia level.  He was encephalopathic on presentation.  CT abdomen/pelvis showed nodular liver contour, cirrhosis, distended gallbladder without evidence of stones.  GI consulted.  Underwent EGD with finding of grade 2 esophageal varices, type II gastric varices, nonbleeding gastric ulcer, alcoholic gastritis.Status post banding.  Hospital course remarkable for persistent encephalopathy likely from alcohol withdrawal/hepatic encephalopathy.  Initiated on CIWA protocol.  Mentation significantly improved, mostly oriented today.  Awaiting PT/OT evaluation  Assessment & Plan:  Principal Problem:   Acute upper GI bleed Active Problems:   Liver cirrhosis (HCC)   Essential hypertension   DM II (diabetes mellitus, type II), controlled (HCC)   Alcohol abuse   Gastric ulcer with hemorrhage   Encephalopathy, hepatic (HCC)   Secondary esophageal varices with bleeding (HCC)  Acute upper GI bleed/hematemesis: History of chronic alcohol use, liver cirrhosis.  Hemoglobin was stable on presentation.  Underwent EGD with finding of grade 2 esophageal varices, type II gastric varices, nonbleeding gastric ulcer, alcoholic gastritis.Status post banding.  Continue PPI.  Continue ceftriaxone , finished the course of octreotide .  Also on Coreg .  Hemoglobin stable.  Continue monitoring.  Started on soft diet .  GI recommended PPI twice daily for 8 weeks then every day indefinitely, lactulose  3 times a day, rifaximin  twice a day.  Elevated ammonia level/hepatic encephalopathy acute alcohol withdrawal: Has history of alcoholic liver  cirrhosis.  Continue lactulose , rifaximin .  Might need eventual IR evaluation for consideration of TIPS given presence of large gastric varices on nonurgent basis. Having regular bowel movements.  Continue CIWA protocol but minimize narcotics to avoid oversedation.  Ammonia level better now.  If not able to take by mouth, continue lactulose  enema.    Non-anion gap metabolic acidosis: Started on gentle bicarb drip.  Resolved  Chronic alcohol abuse: Noted to be withdrawing on admission.  Continue CIWA protocol.  Continue thiamine  and folic acid .  Started on high-dose thiamine  today.  Type 2 diabetes: Recent A1c of 7.2.  Takes metformin  at home.  Currently on sliding scale.  Monitor blood sugars  Hypertension: Takes metoprolol  at home, changed to Coreg   Liver cirrhosis/elevated liver enzymes: Needs to follow-up with hepatology/GI as an outpatient.  Mildly elevated liver enzymes.  Hep A total antibody positive  Morbid obesity: BMI of 31  Debility/deconditioning: PT consulted ,he lives with his wife            DVT prophylaxis:Place TED hose Start: 08/05/24 0718 SCDs Start: 08/05/24 0717 SCDs Start: 08/05/24 0636     Code Status: Full Code  Family Communication: Called and discussed with wife Elaine on phone on 1/11   Patient status:Inpatient  Patient is from :home  Anticipated discharge un:ynfz  Estimated DC date: 1 to 2 days   Consultants: GI  Procedures:EGD  Antimicrobials:  Anti-infectives (From admission, onward)    Start     Dose/Rate Route Frequency Ordered Stop   08/07/24 0700  cefTRIAXone  (ROCEPHIN ) 1 g in sodium chloride  0.9 % 100 mL IVPB        1 g 200 mL/hr over 30 Minutes Intravenous Every 24 hours 08/06/24 1424 08/11/24 2359   08/06/24 1000  rifaximin  (XIFAXAN ) tablet 550 mg  550 mg Oral 2 times daily 08/06/24 0744     08/06/24 0700  cefTRIAXone  (ROCEPHIN ) 2 g in sodium chloride  0.9 % 100 mL IVPB  Status:  Discontinued        2 g 200 mL/hr  over 30 Minutes Intravenous Every 24 hours 08/05/24 1006 08/06/24 1424   08/05/24 0615  cefTRIAXone  (ROCEPHIN ) 2 g in sodium chloride  0.9 % 100 mL IVPB        2 g 200 mL/hr over 30 Minutes Intravenous  Once 08/05/24 0606 08/05/24 0729       Subjective: Patient seen and examined at bedside today.  He looks very comfortable this morning.  Alert and awake, obeys commands, mostly oriented.  He communicated well.  Used video Spanish interpreter.  Told correct month.  Denies new complaints  Objective: Vitals:   08/09/24 0700 08/09/24 0827 08/09/24 0901 08/09/24 1116  BP: 119/60 113/63 113/63 97/60  Pulse: (!) 51 (!) 52 61 (!) 51  Resp:  16  18  Temp:  (!) 97.5 F (36.4 C)  97.7 F (36.5 C)  TempSrc:  Oral  Oral  SpO2: 94% 95%  96%  Weight:        Intake/Output Summary (Last 24 hours) at 08/09/2024 1225 Last data filed at 08/09/2024 1143 Gross per 24 hour  Intake 602.79 ml  Output 1500 ml  Net -897.21 ml   Filed Weights   08/07/24 0342 08/08/24 0500 08/09/24 0621  Weight: 81 kg 78.5 kg 79 kg    Examination:   General exam: Overall comfortable, not in distress, obese HEENT: PERRL Respiratory system:  no wheezes or crackles  Cardiovascular system: S1 & S2 heard, RRR.  Gastrointestinal system: Abdomen is nondistended, soft and nontender. Central nervous system: Alert and mostly oriented Extremities: No edema, no clubbing ,no cyanosis Skin: No rashes, no ulcers,no icterus     Data Reviewed: I have personally reviewed following labs and imaging studies  CBC: Recent Labs  Lab 08/05/24 0332 08/05/24 1606 08/05/24 2005 08/06/24 0009 08/06/24 0317 08/07/24 0259 08/09/24 0339  WBC 8.5  --   --   --  6.3 9.8 5.7  NEUTROABS 5.3  --   --   --   --   --   --   HGB 11.8*   < > 10.1* 10.0* 10.7* 10.1* 9.8*  HCT 33.2*   < > 28.9* 28.7* 30.6* 29.0* 28.1*  MCV 97.6  --   --   --  100.0 99.7 99.3  PLT 86*  --   --   --  71* 77* 63*   < > = values in this interval not displayed.    Basic Metabolic Panel: Recent Labs  Lab 08/05/24 0332 08/06/24 0317 08/07/24 0259 08/08/24 0349 08/09/24 0339  NA 140 141 142 138 137  K 3.8 4.6 3.4* 3.7 3.9  CL 107 113* 111 106 109  CO2 23 17* 21* 22 21*  GLUCOSE 155* 172* 144* 106* 113*  BUN 28* 30* 30* 23* 21*  CREATININE 0.69 0.85 0.79 0.72 0.78  CALCIUM 8.9 8.2* 7.9* 8.1* 7.8*  MG 2.1  --  2.2 2.1  --   PHOS 2.6  --   --  3.1  --      No results found for this or any previous visit (from the past 240 hours).   Radiology Studies: No results found.   Scheduled Meds:  carvedilol   6.25 mg Oral BID WC   folic acid   1 mg Oral Daily  insulin  aspart  0-15 Units Subcutaneous TID WC   lactulose   30 g Oral TID   multivitamin with minerals  1 tablet Oral Daily   naltrexone   50 mg Oral Daily   pantoprazole  (PROTONIX ) IV  40 mg Intravenous Q12H   potassium chloride   40 mEq Oral Once   rifaximin   550 mg Oral BID   senna-docusate  1 tablet Oral QHS   sodium chloride  flush  3 mL Intravenous Q12H   sucralfate   1 g Oral TID WC & HS   Continuous Infusions:  cefTRIAXone  (ROCEPHIN )  IV 1 g (08/09/24 0927)   thiamine  (VITAMIN B1) injection 500 mg (08/09/24 1143)     LOS: 3 days   Ivonne Mustache, MD [facility] P1/14/2026, 12:25 PM  "

## 2024-08-09 NOTE — Evaluation (Signed)
 Physical Therapy Evaluation Patient Details Name: Ian Pham MRN: [id number] DOB: 1965/12/18 Today's Date: 08/09/2024  History of Present Illness  Pt is a 59 y.o. male who presented 08/05/24 with hematemesis. Underwent EGD with finding of grade 2 esophageal varices, type II gastric varices, nonbleeding gastric ulcer, alcoholic gastritis. Status post banding. Hospital course remarkable for persistent encephalopathy likely from alcohol withdrawal/hepatic encephalopathy.  Currently on CIWA protocol. PMH: chronic alcoholism, type 2  diabetes, hypertension, liver cirrhosis   Clinical Impression  Pt presents with condition above and deficits mentioned below, see PT Problem List. PTA, he was independent, working, driving, and living with his wife (who had a CVA and can only assist a little if needed) and x3 young daughters. They live in a trailer with x2 STE. Currently, the pt is only oriented to himself and location. He displays deficits in cognition, balance, and functional strength and coordination. He is currently at risk for falls, displaying x1 LOB in which he needed minA to recover this date. He otherwise only needs CGA for safety to mobilize with and without a RW. Hopefully the pt's cognition and functional mobility improve quickly to allow to go home safely and assist his wife. Provided he does progress quickly as anticipated, then he could benefit from HHPT follow-up. If he does not progress as anticipated, then may need to consider short-term inpatient rehab. Will continue to follow acutely.        If plan is discharge home, recommend the following: A little help with walking and/or transfers;A little help with bathing/dressing/bathroom;Assistance with cooking/housework;Assist for transportation;Help with stairs or ramp for entrance   Can travel by private vehicle        Equipment Recommendations Rolling walker (2 wheels);Other (comment) (shower chair)  Recommendations  for Other Services       Functional Status Assessment Patient has had a recent decline in their functional status and demonstrates the ability to make significant improvements in function in a reasonable and predictable amount of time.     Precautions / Restrictions Precautions Precautions: Fall;Other (comment) Precaution/Restrictions Comments: CIWA protocol Restrictions Weight Bearing Restrictions Per Provider Order: No      Mobility  Bed Mobility Overal bed mobility: Needs Assistance Bed Mobility: Supine to Sit     Supine to sit: Supervision, HOB elevated     General bed mobility comments: Extra time, supervision for safety exiting L EOB, HOB elevated    Transfers Overall transfer level: Needs assistance Equipment used: None Transfers: Sit to/from Stand Sit to Stand: Contact guard assist           General transfer comment: Trunk sway noted when transferring to stand 1x from EOB and 1x from commode, no LOB, CGA for safety    Ambulation/Gait Ambulation/Gait assistance: Contact guard assist, Min assist Gait Distance (Feet): 30 Feet (x2 bouts of ~15 ft > ~30 ft) Assistive device: Rolling walker (2 wheels), None Gait Pattern/deviations: Decreased step length - right, Decreased stride length, Shuffle, Trunk flexed, Step-to pattern Gait velocity: reduced Gait velocity interpretation: <1.31 ft/sec, indicative of household ambulator   General Gait Details: Pt ambulated initially without UE support, taking slow, small, shuffling steps. Pt continued this gait pattern even when the RW was provided. He had a decreased R step length compared to his L, needing tactile and verbal cues to correct with fair carryover. x1 LOB needing minA to recover, otherwise CGA for safety with and without the RW  Stairs  Wheelchair Mobility     Tilt Bed    Modified Rankin (Stroke Patients Only)       Balance Overall balance assessment: Needs assistance Sitting-balance  support: No upper extremity supported, Feet supported Sitting balance-Leahy Scale: Good Sitting balance - Comments: Able to reach slowly off COG to donn socks sitting EOB and to perform pericare on toilet, no LOB, supervision for safety   Standing balance support: No upper extremity supported, Bilateral upper extremity supported, During functional activity Standing balance-Leahy Scale: Fair Standing balance comment: x1 LOB needing minA to recover, otherwise only CGA for safety with and without the RW                             Pertinent Vitals/Pain Pain Assessment Pain Assessment: Faces Faces Pain Scale: No hurt Pain Intervention(s): Monitored during session    Home Living Family/patient expects to be discharged to:: Private residence Living Arrangements: Spouse/significant other;Children (x3 daughters 3 yo-8 yo) Available Help at Discharge: Family;Available 24 hours/day (wife, but wife had a CVA and can only assist minimally if so) Type of Home: Mobile home Home Access: Stairs to enter Entrance Stairs-Rails: None (can reach door to hold to support self if needed) Entrance Stairs-Number of Steps: 2   Home Layout: One level Home Equipment: None (wife intermittently uses a RW)      Prior Function Prior Level of Function : Independent/Modified Independent;Working/employed;Driving             Mobility Comments: No AD ADLs Comments: Works with concrete and brick. Often does the cooking due to his wife having a hx of CVA and is semi limited in household chores. Wife watches children during the day and tells them to stop if anything seems dangerous     Extremity/Trunk Assessment   Upper Extremity Assessment Upper Extremity Assessment: Defer to OT evaluation    Lower Extremity Assessment Lower Extremity Assessment: Generalized weakness (and incoordination functionally; MMT scores of 5/5 grossly; denied numbness/tingling bil)    Cervical / Trunk  Assessment Cervical / Trunk Assessment: Normal  Communication   Communication Communication: No apparent difficulties Factors Affecting Communication: Other (comment) (Non-English speaking; In-person Spanish interpreter, Raquel, utilized throughout session)    Cognition Arousal: Alert Behavior During Therapy: Impulsive   PT - Cognitive impairments: No family/caregiver present to determine baseline, Orientation, Awareness, Memory, Attention, Safety/Judgement   Orientation impairments: Situation, Time                   PT - Cognition Comments: Difficult to assess cognition due to language barrier and no family present to confirm baseline. But pt is only A&O to self and location and can be mildly impulsive in his movements, demonstrating some decreased insight into his deficits and safety. He was aware of having a BM in bed when asked and was able to identify he needed to go to the bathroom again beforehand. Pt reports the current year is 1996 then 1926 Following commands: Impaired Following commands impaired: Only follows one step commands consistently, Follows one step commands with increased time     Cueing Cueing Techniques: Verbal cues, Tactile cues     General Comments General comments (skin integrity, edema, etc.): VSS on RA    Exercises     Assessment/Plan    PT Assessment Patient needs continued PT services  PT Problem List Decreased strength;Decreased activity tolerance;Decreased balance;Decreased coordination;Decreased safety awareness;Decreased cognition;Decreased knowledge of use of DME  PT Treatment Interventions DME instruction;Stair training;Gait training;Functional mobility training;Therapeutic activities;Balance training;Therapeutic exercise;Neuromuscular re-education;Cognitive remediation;Patient/family education    PT Goals (Current goals can be found in the Care Plan section)  Acute Rehab PT Goals Patient Stated Goal: to get back home to his  family PT Goal Formulation: With patient Time For Goal Achievement: 08/23/24 Potential to Achieve Goals: Good    Frequency Min 2X/week     Co-evaluation               AM-PAC PT 6 Clicks Mobility  Outcome Measure Help needed turning from your back to your side while in a flat bed without using bedrails?: None Help needed moving from lying on your back to sitting on the side of a flat bed without using bedrails?: A Little Help needed moving to and from a bed to a chair (including a wheelchair)?: A Little Help needed standing up from a chair using your arms (e.g., wheelchair or bedside chair)?: A Little Help needed to walk in hospital room?: A Little Help needed climbing 3-5 steps with a railing? : A Little 6 Click Score: 19    End of Session Equipment Utilized During Treatment: Gait belt Activity Tolerance: Patient tolerated treatment well Patient left: in chair;with call bell/phone within reach;with chair alarm set   PT Visit Diagnosis: Unsteadiness on feet (R26.81);Other abnormalities of gait and mobility (R26.89);Muscle weakness (generalized) (M62.81);Difficulty in walking, not elsewhere classified (R26.2)    Time: 9047-8964 PT Time Calculation (min) (ACUTE ONLY): 43 min   Charges:   PT Evaluation $PT Eval Moderate Complexity: 1 Mod PT Treatments $Gait Training: 8-22 mins $Therapeutic Activity: 8-22 mins PT General Charges $$ ACUTE PT VISIT: 1 Visit         Theo Ferretti, PT, DPT Acute Rehabilitation Services  Office: [phone]   Theo CHRISTELLA Ferretti 08/09/2024, 12:32 PM

## 2024-08-10 ENCOUNTER — Telehealth (HOSPITAL_COMMUNITY): Payer: Self-pay

## 2024-08-10 ENCOUNTER — Other Ambulatory Visit (HOSPITAL_COMMUNITY): Payer: Self-pay

## 2024-08-10 LAB — COMPREHENSIVE METABOLIC PANEL WITH GFR
ALT: 45 U/L — ABNORMAL HIGH (ref 0–44)
AST: 83 U/L — ABNORMAL HIGH (ref 15–41)
Albumin: 2.4 g/dL — ABNORMAL LOW (ref 3.5–5.0)
Alkaline Phosphatase: 154 U/L — ABNORMAL HIGH (ref 38–126)
Anion gap: 6 (ref 5–15)
BUN: 15 mg/dL (ref 6–20)
CO2: 23 mmol/L (ref 22–32)
Calcium: 8 mg/dL — ABNORMAL LOW (ref 8.9–10.3)
Chloride: 106 mmol/L (ref 98–111)
Creatinine, Ser: 0.87 mg/dL (ref 0.61–1.24)
GFR, Estimated: >60 mL/min
Glucose, Bld: 111 mg/dL — ABNORMAL HIGH (ref 70–99)
Potassium: 3.9 mmol/L (ref 3.5–5.1)
Sodium: 134 mmol/L — ABNORMAL LOW (ref 135–145)
Total Bilirubin: 3.1 mg/dL — ABNORMAL HIGH (ref 0.0–1.2)
Total Protein: 5.8 g/dL — ABNORMAL LOW (ref 6.5–8.1)

## 2024-08-10 LAB — CBC
HCT: 27.2 % — ABNORMAL LOW (ref 39.0–52.0)
Hemoglobin: 9.4 g/dL — ABNORMAL LOW (ref 13.0–17.0)
MCH: 34.4 pg — ABNORMAL HIGH (ref 26.0–34.0)
MCHC: 34.6 g/dL (ref 30.0–36.0)
MCV: 99.6 fL (ref 80.0–100.0)
Platelets: 66 K/uL — ABNORMAL LOW (ref 150–400)
RBC: 2.73 MIL/uL — ABNORMAL LOW (ref 4.22–5.81)
RDW: 15.3 % (ref 11.5–15.5)
WBC: 6.7 K/uL (ref 4.0–10.5)
nRBC: 0 % (ref 0.0–0.2)

## 2024-08-10 LAB — GLUCOSE, CAPILLARY
Glucose-Capillary: 129 mg/dL — ABNORMAL HIGH (ref 70–99)
Glucose-Capillary: 183 mg/dL — ABNORMAL HIGH (ref 70–99)
Glucose-Capillary: 131 mg/dL — ABNORMAL HIGH (ref 70–99)
Glucose-Capillary: 143 mg/dL — ABNORMAL HIGH (ref 70–99)

## 2024-08-10 LAB — AMMONIA: Ammonia: 50 umol/L — ABNORMAL HIGH (ref 9–35)

## 2024-08-10 MED ORDER — CARVEDILOL 3.125 MG PO TABS
3.1250 mg | ORAL_TABLET | Freq: Two times a day (BID) | ORAL | Status: DC
  Administered 2024-08-10 – 2024-08-11 (×2): 3.125 mg via ORAL
  Filled 2024-08-10 (×3): qty 1

## 2024-08-10 MED ORDER — RIFAXIMIN 550 MG PO TABS
550.0000 mg | ORAL_TABLET | Freq: Two times a day (BID) | ORAL | 1 refills | Status: DC
  Filled 2024-08-10: qty 60, 30d supply, fill #0

## 2024-08-10 NOTE — Telephone Encounter (Signed)
 Pharmacy Patient Advocate Encounter  Insurance verification completed.    The patient is uninsured, started patient assistance paperwork on 08/07/24  Ran test claim for Xifaxan  550mg  tablet and the current 30 day cash price is $3,700.60.   This test claim was processed through [facility]- copay amounts may vary at other pharmacies due to pharmacy/plan contracts, or as the patient moves through the different stages of their insurance plan.

## 2024-08-10 NOTE — Progress Notes (Signed)
 " PROGRESS NOTE  Ian Pham  FMW:[id number] DOB: 1966/03/11 DOA: 08/05/2024 PCP: Pcp, No   Brief Narrative: Patient is a 59 year old male with history of chronic alcoholism, type 2  diabetes, hypertension, liver cirrhosis who presented with multiple episodes of hematemesis, diffuse abdominal pain.  Hemoglobin was 11.8 on presentation, mild elevated liver enzymes, elevated ammonia level.  He was encephalopathic on presentation.  CT abdomen/pelvis showed nodular liver contour, cirrhosis, distended gallbladder without evidence of stones.  GI consulted.  Underwent EGD with finding of grade 2 esophageal varices, type II gastric varices, nonbleeding gastric ulcer, alcoholic gastritis.Status post banding.  Hospital course remarkable for persistent encephalopathy likely from alcohol withdrawal/hepatic encephalopathy.  Initiated on CIWA protocol.  Mentation significantly improved, mostly oriented today.  PT/OT recommending home health.  Monitoring 1 more day before discharge.  Assessment & Plan:  Principal Problem:   Acute upper GI bleed Active Problems:   Liver cirrhosis (HCC)   Essential hypertension   DM II (diabetes mellitus, type II), controlled (HCC)   Alcohol abuse   Gastric ulcer with hemorrhage   Encephalopathy, hepatic (HCC)   Secondary esophageal varices with bleeding (HCC)  Acute upper GI bleed/hematemesis: History of chronic alcohol use, liver cirrhosis.  Hemoglobin was stable on presentation.  Underwent EGD with finding of grade 2 esophageal varices, type II gastric varices, nonbleeding gastric ulcer, alcoholic gastritis.Status post banding.  Continue PPI.  Continue ceftriaxone , finished the course of octreotide .  Also on Coreg .  Hemoglobin stable.  Continue monitoring.  Started on soft diet, tolerating.  Will advance diet to regular  GI recommended PPI twice daily for 8 weeks then every day indefinitely, lactulose  3 times a day, rifaximin  twice a day.  Elevated ammonia  level/hepatic encephalopathy acute alcohol withdrawal: Has history of alcoholic liver cirrhosis.  Continue lactulose , rifaximin .  Might need eventual IR evaluation for consideration of TIPS given presence of large gastric varices on nonurgent basis. Having regular bowel movements.  Continue CIWA protocol but minimize narcotics to avoid oversedation.  Ammonia level better now.  If not able to take by mouth, continue lactulose  enema.    Non-anion gap metabolic acidosis: Started on gentle bicarb drip.  Resolved  Chronic alcohol abuse: Noted to be withdrawing on admission.  Continue CIWA protocol.  Continue thiamine  and folic acid .  Started on high-dose thiamine .  Type 2 diabetes: Recent A1c of 7.2.  Takes metformin  at home.  Currently on sliding scale.  Monitor blood sugars  Hypertension: Takes metoprolol  at home, changed to Coreg .  Patient is mildly hypotensive and bradycardic.  Discontinued, will hold Coreg .  Liver cirrhosis/elevated liver enzymes: Needs to follow-up with hepatology/GI as an outpatient.  Mildly elevated liver enzymes.  Hep A total antibody positive  Morbid obesity: BMI of 31  Debility/deconditioning: PT consulted ,he lives with his wife.  Complains of weakness today.  PT recommended home health            DVT prophylaxis:Place TED hose Start: 08/05/24 0718 SCDs Start: 08/05/24 0717 SCDs Start: 08/05/24 0636     Code Status: Full Code  Family Communication: Called and discussed with wife Elaine on phone on 1/11   Patient status:Inpatient  Patient is from :home  Anticipated discharge un:ynfz  Estimated DC date: Tomorrow   Consultants: GI  Procedures:EGD  Antimicrobials:  Anti-infectives (From admission, onward)    Start     Dose/Rate Route Frequency Ordered Stop   08/07/24 0700  cefTRIAXone  (ROCEPHIN ) 1 g in sodium chloride  0.9 % 100 mL IVPB  1 g 200 mL/hr over 30 Minutes Intravenous Every 24 hours 08/06/24 1424 08/11/24 2359   08/06/24 1000   rifaximin  (XIFAXAN ) tablet 550 mg        550 mg Oral 2 times daily 08/06/24 0744     08/06/24 0700  cefTRIAXone  (ROCEPHIN ) 2 g in sodium chloride  0.9 % 100 mL IVPB  Status:  Discontinued        2 g 200 mL/hr over 30 Minutes Intravenous Every 24 hours 08/05/24 1006 08/06/24 1424   08/05/24 0615  cefTRIAXone  (ROCEPHIN ) 2 g in sodium chloride  0.9 % 100 mL IVPB        2 g 200 mL/hr over 30 Minutes Intravenous  Once 08/05/24 0606 08/05/24 0729       Subjective: Patient seen and examined at bedside today.  Hemodynamically stable lying in bed.  Mostly oriented.  Communication done with the help of video Spanish interpreter.  He feels slightly weak today.  Does not feel ready to go home.  Overall, he appears very comfortable, not in any Distress.  Blood pressure slightly soft  Objective: Vitals:   08/10/24 0300 08/10/24 0800 08/10/24 0900 08/10/24 0912  BP: (!) 88/47 106/63 (!) 97/52 (!) 99/51  Pulse: (!) 58 70 63 (!) 58  Resp: 20  16   Temp: 98.3 F (36.8 C) 98.5 F (36.9 C) 98.4 F (36.9 C)   TempSrc: Oral Oral Oral   SpO2: 97%  98%   Weight: 82.9 kg       Intake/Output Summary (Last 24 hours) at 08/10/2024 1052 Last data filed at 08/10/2024 0830 Gross per 24 hour  Intake 897.93 ml  Output 2050 ml  Net -1152.07 ml   Filed Weights   08/08/24 0500 08/09/24 0621 08/10/24 0300  Weight: 78.5 kg 79 kg 82.9 kg    Examination:   General exam: Overall comfortable, not in distress,obese HEENT: PERRL Respiratory system:  no wheezes or crackles  Cardiovascular system: S1 & S2 heard, RRR.  Gastrointestinal system: Abdomen is nondistended, soft and nontender. Central nervous system: Alert and oriented Extremities: No edema, no clubbing ,no cyanosis Skin: No rashes, no ulcers,no icterus     Data Reviewed: I have personally reviewed following labs and imaging studies  CBC: Recent Labs  Lab 08/05/24 0332 08/05/24 1606 08/06/24 0009 08/06/24 0317 08/07/24 0259 08/09/24 0339  08/10/24 0344  WBC 8.5  --   --  6.3 9.8 5.7 6.7  NEUTROABS 5.3  --   --   --   --   --   --   HGB 11.8*   < > 10.0* 10.7* 10.1* 9.8* 9.4*  HCT 33.2*   < > 28.7* 30.6* 29.0* 28.1* 27.2*  MCV 97.6  --   --  100.0 99.7 99.3 99.6  PLT 86*  --   --  71* 77* 63* 66*   < > = values in this interval not displayed.   Basic Metabolic Panel: Recent Labs  Lab 08/05/24 0332 08/06/24 0317 08/07/24 0259 08/08/24 0349 08/09/24 0339 08/10/24 0344  NA 140 141 142 138 137 134*  K 3.8 4.6 3.4* 3.7 3.9 3.9  CL 107 113* 111 106 109 106  CO2 23 17* 21* 22 21* 23  GLUCOSE 155* 172* 144* 106* 113* 111*  BUN 28* 30* 30* 23* 21* 15  CREATININE 0.69 0.85 0.79 0.72 0.78 0.87  CALCIUM 8.9 8.2* 7.9* 8.1* 7.8* 8.0*  MG 2.1  --  2.2 2.1  --   --   PHOS 2.6  --   --  3.1  --   --      No results found for this or any previous visit (from the past 240 hours).   Radiology Studies: No results found.   Scheduled Meds:  carvedilol   3.125 mg Oral BID WC   folic acid   1 mg Oral Daily   insulin  aspart  0-15 Units Subcutaneous TID WC   lactulose   30 g Oral TID   multivitamin with minerals  1 tablet Oral Daily   naltrexone   50 mg Oral Daily   pantoprazole  (PROTONIX ) IV  40 mg Intravenous Q12H   potassium chloride   40 mEq Oral Once   rifaximin   550 mg Oral BID   senna-docusate  1 tablet Oral QHS   sodium chloride  flush  3 mL Intravenous Q12H   sucralfate   1 g Oral TID WC & HS   Continuous Infusions:  cefTRIAXone  (ROCEPHIN )  IV 1 g (08/10/24 0923)   thiamine  (VITAMIN B1) injection Stopped (08/09/24 1230)     LOS: 4 days   Ivonne Mustache, MD [facility] P1/15/2026, 10:52 AM  "

## 2024-08-10 NOTE — Progress Notes (Signed)
 Physical Therapy Treatment Patient Details Name: Ian Pham MRN: [id number] DOB: 12/21/1965 Today's Date: 08/10/2024   History of Present Illness Pt is a 59 y.o. male who presented 08/05/24 with hematemesis. Underwent EGD with finding of grade 2 esophageal varices, type II gastric varices, nonbleeding gastric ulcer, alcoholic gastritis. Status post banding. Hospital course remarkable for persistent encephalopathy likely from alcohol withdrawal/hepatic encephalopathy.  Currently on CIWA protocol. PMH: chronic alcoholism, type 2  diabetes, hypertension, liver cirrhosis    PT Comments  The pt is making great functional progress. He was able to ambulate much further with improved stability today, but still displays some mild trunk sway with transfers and gait. He did not have any LOB with dynamic gait challenges though. He would slow his gait to accommodate for these challenges to maintain his safety, demonstrating improved self awareness. Educated pt on alcohol cessation and on progressing upright mobility tolerance to try to reduce his dizziness and facilitate return to baseline. He verbalized understanding. Secondary to his functional improvement, updated d/c recs to no PT or DME needs at d/c. Will continue to follow acutely.    BP  103/56 supine 110/59 (73) sitting 109/65 (79) standing 103/61 (75) ambulating    If plan is discharge home, recommend the following: Assistance with cooking/housework;Assist for transportation;Help with stairs or ramp for entrance   Can travel by private vehicle        Equipment Recommendations  None recommended by PT    Recommendations for Other Services       Precautions / Restrictions Precautions Precautions: Fall;Other (comment) Precaution/Restrictions Comments: CIWA protocol Restrictions Weight Bearing Restrictions Per Provider Order: No     Mobility  Bed Mobility Overal bed mobility: Modified Independent Bed Mobility:  Supine to Sit     Supine to sit: HOB elevated, Modified independent (Device/Increase time)     General bed mobility comments: HOB elevated, no assistance needed to exit L EOB    Transfers Overall transfer level: Needs assistance Equipment used: None Transfers: Sit to/from Stand Sit to Stand: Supervision           General transfer comment: Supervision for safety standing from EOB, mild posterior sway but no LOB    Ambulation/Gait Ambulation/Gait assistance: Contact guard assist, Supervision Gait Distance (Feet): 220 Feet Assistive device: None Gait Pattern/deviations: Decreased stride length, Step-through pattern, Drifts right/left Gait velocity: reduced Gait velocity interpretation: <1.8 ft/sec, indicate of risk for recurrent falls   General Gait Details: Pt ambulates with a better step-through gait pattern and with improved stability today, yet still displays a mild trunk sway. Pt able to change head positions and directions without LOB but does decrease gait speed to accomodate for the dynamic challenge. CGA-supervision for safety. Cued pt to widen his stance as needed for improved balance   Stairs             Wheelchair Mobility     Tilt Bed    Modified Rankin (Stroke Patients Only)       Balance Overall balance assessment: Needs assistance Sitting-balance support: No upper extremity supported, Feet supported Sitting balance-Leahy Scale: Good Sitting balance - Comments: No LOB sitting EOB   Standing balance support: No upper extremity supported, During functional activity Standing balance-Leahy Scale: Good Standing balance comment: Mild sway but no LOB with dynamic gait challenges                            Communication Communication Communication: No apparent  difficulties Factors Affecting Communication: Other (comment) (Non-English speaking; video Spanish interpreter, Ian Pham, [phone], utilized throughout session)  Cognition Arousal:  Alert Behavior During Therapy: WFL for tasks assessed/performed   PT - Cognitive impairments: No family/caregiver present to determine baseline, Awareness                       PT - Cognition Comments: A&Ox4. Pt more aware of his deficits and safety. However, still questionable awareness of the capabilities of the hospital as pt asked PT if we could assist with his rent money, financial issues, and housing. Sent a secure chat to social worker to ask about resources for pt and his family. Following commands: Intact      Cueing Cueing Techniques: Verbal cues, Tactile cues  Exercises      General Comments General comments (skin integrity, edema, etc.): BP 103/56 supine, 110/59 (73) sitting, 109/65 (79) standing, 103/61 (75) ambulating; Head impulse test did not reproduce dizziness, questionable L beat nystagmus with R head impulse test though; Educated pt on alcohol cessation and on progressing upright mobility tolerance to try to reduce his dizziness and facilitate return to baseline. he verbalized understanding.      Pertinent Vitals/Pain Pain Assessment Faces Pain Scale: No hurt    Home Living                          Prior Function            PT Goals (current goals can now be found in the care plan section) Acute Rehab PT Goals Patient Stated Goal: to get back home to his family PT Goal Formulation: With patient Time For Goal Achievement: 08/23/24 Potential to Achieve Goals: Good Progress towards PT goals: Progressing toward goals    Frequency    Min 2X/week      PT Plan      Co-evaluation              AM-PAC PT 6 Clicks Mobility   Outcome Measure  Help needed turning from your back to your side while in a flat bed without using bedrails?: None Help needed moving from lying on your back to sitting on the side of a flat bed without using bedrails?: None Help needed moving to and from a bed to a chair (including a wheelchair)?: A  Little Help needed standing up from a chair using your arms (e.g., wheelchair or bedside chair)?: A Little Help needed to walk in hospital room?: A Little Help needed climbing 3-5 steps with a railing? : A Little 6 Click Score: 20    End of Session Equipment Utilized During Treatment: Gait belt Activity Tolerance: Patient tolerated treatment well Patient left: in chair;with call bell/phone within reach;with chair alarm set   PT Visit Diagnosis: Unsteadiness on feet (R26.81);Other abnormalities of gait and mobility (R26.89);Muscle weakness (generalized) (M62.81);Difficulty in walking, not elsewhere classified (R26.2)     Time: 8753-8687 PT Time Calculation (min) (ACUTE ONLY): 26 min  Charges:    $Gait Training: 8-22 mins $Therapeutic Activity: 8-22 mins PT General Charges $$ ACUTE PT VISIT: 1 Visit                     Ian Pham, PT, DPT Acute Rehabilitation Services  Office: [phone]    Ian CHRISTELLA Pham 08/10/2024, 1:39 PM

## 2024-08-10 NOTE — Evaluation (Signed)
 Occupational Therapy Evaluation and Discharge Patient Details Name: Ian Pham MRN: [id number] DOB: September 16, 1965 Today's Date: 08/10/2024   History of Present Illness   Pt is a 59 y.o. male who presented 08/05/24 with hematemesis. Underwent EGD with finding of grade 2 esophageal varices, type II gastric varices, nonbleeding gastric ulcer, alcoholic gastritis. Status post banding. Hospital course remarkable for persistent encephalopathy likely from alcohol withdrawal/hepatic encephalopathy.  Currently on CIWA protocol. PMH: chronic alcoholism, type 2  diabetes, hypertension, liver cirrhosis     Clinical Impressions This 59 yo male admitted with above presents to acute OT with PLOF of being completely independent with basic ADLs, IADLs, driving, and working. Currently is overall CGA when up on his feet without AD and moving at a normal pace. Feel he can progress back to his independence without any further OT follow up, we will D/C from acute OT.      Functional Status Assessment   Patient has had a recent decline in their functional status and demonstrates the ability to make significant improvements in function in a reasonable and predictable amount of time. (without further need for skilled OT, pt is doing better than he was yesterday with PT.)     Equipment Recommendations   None recommended by OT      Precautions/Restrictions   Precautions Precautions: Fall;Other (comment) Precaution/Restrictions Comments: CIWA protocol Restrictions Weight Bearing Restrictions Per Provider Order: No     Mobility Bed Mobility Overal bed mobility: Independent                  Transfers Overall transfer level: Needs assistance Equipment used: None Transfers: Sit to/from Stand Sit to Stand: Contact guard assist           General transfer comment: no trunk sway today with standing, ambulating to and from bathroom, standing at sink to wash face and hands       Balance Overall balance assessment: Mild deficits observed, not formally tested Sitting-balance support: Single extremity supported, No upper extremity supported Sitting balance-Leahy Scale: Good     Standing balance support: No upper extremity supported, During functional activity Standing balance-Leahy Scale: Fair Standing balance comment: standing at sink to groom                           ADL either performed or assessed with clinical judgement   ADL                                         General ADL Comments: overall at a CGA when up on his feet, no AD     Vision Baseline Vision/History: 0 No visual deficits Patient Visual Report: No change from baseline              Pertinent Vitals/Pain Pain Assessment Pain Assessment: Faces Faces Pain Scale: Hurts a little bit Pain Location: back of head Pain Descriptors / Indicators: Headache Pain Intervention(s): Limited activity within patient's tolerance, Monitored during session     Extremity/Trunk Assessment Upper Extremity Assessment Upper Extremity Assessment: Overall WFL for tasks assessed           Communication Communication Communication: No apparent difficulties Factors Affecting Communication: Non - English speaking, interpreter not available (used interpreter via [employer] [phone])   Cognition Arousal: Alert Behavior During Therapy: Impulsive (mildly) Cognition: No apparent impairments  OT - Cognition Comments: A & O x4 via interpreter on ipad                 Following commands: Intact Following commands impaired: Only follows one step commands consistently     Cueing  General Comments   Cueing Techniques: Verbal cues  VSS on RA           Home Living Family/patient expects to be discharged to:: (P) Private residence Living Arrangements: (P) Spouse/significant other;Children (x 3 dtrs 3-8 yo)) Available Help at Discharge: (P)  Family;Available 24 hours/day (wife, but had a CVA so can only A miminally) Type of Home: (P) Mobile home Home Access: (P) Stairs to enter Entrance Stairs-Number of Steps: (P) 2 Entrance Stairs-Rails: (P) None (can reach door to support self if needed) Home Layout: (P) One level     Bathroom Shower/Tub: (P) Tub/shower unit   Bathroom Toilet: (P) Standard     Home Equipment: (P) None   Additional Comments: (P) wife intermittenlty usese RW      Prior Functioning/Environment Prior Level of Function : (P) Independent/Modified Independent;Working/employed;Driving             Mobility Comments: (P) No AD ADLs Comments: (P) Works with concrete and brick. Often does the cooking due to his wife having a hx of CVA and is semi limited in household chores. Wife watches children during the day and tells them to stop if anything seems dangerous    OT Problem List: Impaired balance (sitting and/or standing);Pain        OT Goals(Current goals can be found in the care plan section)   Acute Rehab OT Goals Patient Stated Goal: to eat breakfast (set up for him before I left the room)         AM-PAC OT 6 Clicks Daily Activity     Outcome Measure Help from another person eating meals?: None Help from another person taking care of personal grooming?: A Little Help from another person toileting, which includes using toliet, bedpan, or urinal?: A Little Help from another person bathing (including washing, rinsing, drying)?: A Little Help from another person to put on and taking off regular upper body clothing?: A Little Help from another person to put on and taking off regular lower body clothing?: A Little 6 Click Score: 19   End of Session Nurse Communication: Mobility status (via secure chat)  Activity Tolerance: Patient tolerated treatment well Patient left: in bed;with call bell/phone within reach;with bed alarm set  OT Visit Diagnosis: Unsteadiness on feet (R26.81);Other  abnormalities of gait and mobility (R26.89);Pain Pain - part of body:  (back of head)                Time: 9194-9164 OT Time Calculation (min): 30 min Charges:  OT General Charges $OT Visit: 1 Visit OT Evaluation $OT Eval Moderate Complexity: 1 Mod OT Treatments $Self Care/Home Management : 8-22 mins  Donny BECKER OT Acute Rehabilitation Services Office [phone]    [place] Dorothyann Distel 08/10/2024, 9:36 AM

## 2024-08-11 ENCOUNTER — Other Ambulatory Visit (HOSPITAL_COMMUNITY): Payer: Self-pay

## 2024-08-11 LAB — GLUCOSE, CAPILLARY: Glucose-Capillary: 130 mg/dL — ABNORMAL HIGH (ref 70–99)

## 2024-08-11 MED ORDER — CARVEDILOL 3.125 MG PO TABS
3.1250 mg | ORAL_TABLET | Freq: Two times a day (BID) | ORAL | 0 refills | Status: DC
  Filled 2024-08-11: qty 60, 30d supply, fill #0

## 2024-08-11 MED ORDER — PANTOPRAZOLE SODIUM 40 MG PO TBEC
40.0000 mg | DELAYED_RELEASE_TABLET | Freq: Two times a day (BID) | ORAL | 0 refills | Status: DC
  Filled 2024-08-11: qty 60, 30d supply, fill #0

## 2024-08-11 MED ORDER — LACTULOSE 10 GM/15ML PO SOLN
30.0000 g | Freq: Three times a day (TID) | ORAL | 10 refills | Status: DC
  Filled 2024-08-11: qty 946, 7d supply, fill #0

## 2024-08-11 MED ORDER — FOLIC ACID 1 MG PO TABS
1.0000 mg | ORAL_TABLET | Freq: Every day | ORAL | 0 refills | Status: AC
  Filled 2024-08-11: qty 30, 30d supply, fill #0

## 2024-08-11 MED ORDER — THIAMINE HCL 100 MG PO TABS
100.0000 mg | ORAL_TABLET | Freq: Every day | ORAL | 0 refills | Status: AC
  Filled 2024-08-11: qty 30, 30d supply, fill #0

## 2024-08-11 NOTE — Discharge Summary (Signed)
 Physician Discharge Summary  Ian Pham FMW:[id number] DOB: 08-Dec-1965 DOA: 08/05/2024  PCP: Pcp, No  Admit date: 08/05/2024 Discharge date: 08/11/2024  Admitted From: Home Disposition:  Home  Discharge Condition:Stable CODE STATUS:FULL Diet recommendation:  Regular  Brief/Interim Summary: Patient is a 59 year old male with history of chronic alcoholism, type 2  diabetes, hypertension, liver cirrhosis who presented with multiple episodes of hematemesis, diffuse abdominal pain.  Hemoglobin was 11.8 on presentation, mild elevated liver enzymes, elevated ammonia level.  He was encephalopathic on presentation.  CT abdomen/pelvis showed nodular liver contour, cirrhosis, distended gallbladder without evidence of stones.  GI consulted.  Underwent EGD with finding of grade 2 esophageal varices, type II gastric varices, nonbleeding gastric ulcer, alcoholic gastritis.Status post banding.  Hospital course remarkable for persistent encephalopathy likely from alcohol withdrawal/hepatic encephalopathy.  Initiated on CIWA protocol.  Mentation significantly improved, mostly oriented today.  PT/OT recommending home health.  Medically stable for discharge to home.  Following problems were addressed during the hospitalization:  Acute upper GI bleed/hematemesis: History of chronic alcohol use, liver cirrhosis.  Hemoglobin was stable on presentation.  Underwent EGD with finding of grade 2 esophageal varices, type II gastric varices, nonbleeding gastric ulcer, alcoholic gastritis.Status post banding. finished the course of octreotide .  Also on Coreg .  Hemoglobin stable.    GI recommended PPI twice daily for 8 weeks then every day indefinitely, lactulose  3 times a day, rifaximin  twice a day.   Elevated ammonia level/hepatic encephalopathy acute alcohol withdrawal: Has history of alcoholic liver cirrhosis.  Continue lactulose , rifaximin .  Might need eventual IR evaluation for consideration of TIPS  given presence of large gastric varices on nonurgent basis. Having regular bowel movements.   Ammonia level better now.    Non-anion gap metabolic acidosis: Started on gentle bicarb drip.  Resolved   Chronic alcohol abuse: Noted to be withdrawing on admission. Was on  CIWA protocol.  Continue thiamine  and folic acid .     Type 2 diabetes: Recent A1c of 5.5.  Currently not on any medications   Liver cirrhosis/elevated liver enzymes: Needs to follow-up with hepatology/GI as an outpatient.  Mildly elevated liver enzymes.  Hep A total antibody positive   Morbid obesity: BMI of 31   Debility/deconditioning: PT consulted ,he lives with his wife.  PT recommended home health   Discharge Diagnoses:  Principal Problem:   Acute upper GI bleed Active Problems:   Liver cirrhosis (HCC)   Essential hypertension   DM II (diabetes mellitus, type II), controlled (HCC)   Alcohol abuse   Gastric ulcer with hemorrhage   Encephalopathy, hepatic (HCC)   Secondary esophageal varices with bleeding [facility])    Discharge Instructions  Discharge Instructions     Discharge instructions   Complete by: As directed    1)Please quit alcohol 2)Take your  medications as instructed 3)Follow up with gastroenterology on the given appointment date 4)Follow up with a PCP in 1 to 2 weeks   Increase activity slowly   Complete by: As directed       Allergies as of 08/11/2024   No Known Allergies      Medication List     TAKE these medications    carvedilol  3.125 MG tablet Commonly known as: COREG  Take 1 tablet (3.125 mg total) by mouth 2 (two) times daily with a meal.   folic acid  1 MG tablet Commonly known as: FOLVITE  Take 1 tablet (1 mg total) by mouth daily. Start taking on: August 12, 2024   lactulose  10 GM/15ML  solution Commonly known as: CHRONULAC  Take 45 mLs (30 g total) by mouth 3 (three) times daily.   pantoprazole  40 MG tablet Commonly known as: Protonix  Take 1 tablet (40 mg total) by  mouth 2 (two) times daily.   rifaximin  550 MG Tabs tablet Commonly known as: XIFAXAN  Take 1 tablet (550 mg total) by mouth 2 (two) times daily.   thiamine  100 MG tablet Commonly known as: VITAMIN B1 Take 1 tablet (100 mg total) by mouth daily.               Durable Medical Equipment  (From admission, onward)           Start     Ordered   08/10/24 0752  For home use only DME Walker rolling  Once       Question Answer Comment  Walker: With 5 Inch Wheels   Patient needs a walker to treat with the following condition Balance disorder      08/10/24 0751            Follow-up Information     Mollie Nestor HERO, PA-C Follow up on 08/17/2024.   Specialty: Gastroenterology Why: 3:00 PM Contact information: [address] [place] [address] [phone]                Allergies[1]  Consultations: GI   Procedures/Studies: CT ABDOMEN PELVIS W CONTRAST Result Date: 08/05/2024 CLINICAL DATA:  Abdominal pain.  Vomiting. EXAM: CT ABDOMEN AND PELVIS WITH CONTRAST TECHNIQUE: Multidetector CT imaging of the abdomen and pelvis was performed using the standard protocol following bolus administration of intravenous contrast. RADIATION DOSE REDUCTION: This exam was performed according to the departmental dose-optimization program which includes automated exposure control, adjustment of the mA and/or kV according to patient size and/or use of iterative reconstruction technique. CONTRAST:  75mL OMNIPAQUE  IOHEXOL  350 MG/ML SOLN COMPARISON:  09/24/2017 FINDINGS: Lower chest: Dependent atelectasis noted in the lung bases. Hepatobiliary: Markedly nodular liver contour is compatible with cirrhosis, new in the interval. Gallbladder is markedly distended with some ill definition of the gallbladder wall but no evidence for gallstones. No intrahepatic or extrahepatic biliary dilation. Pancreas: No focal mass lesion. No dilatation of the main duct. No intraparenchymal cyst. No  peripancreatic edema. Spleen: No splenomegaly. No suspicious focal mass lesion. Adrenals/Urinary Tract: No adrenal nodule or mass. Kidneys unremarkable. No evidence for hydroureter. The urinary bladder appears normal for the degree of distention. Stomach/Bowel: Stomach is unremarkable. No gastric wall thickening. No evidence of outlet obstruction. Duodenum is normally positioned as is the ligament of Treitz. No small bowel wall thickening. No small bowel dilatation. The terminal ileum is normal. The appendix is normal. No gross colonic mass. No colonic wall thickening. Diverticular changes are noted in the left colon without evidence of diverticulitis. 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 Extensive collateralization noted in the left upper quadrant. Paraumbilical vein is been recanalized. No abdominal aortic aneurysm. No abdominal aortic atherosclerotic calcification. There is no gastrohepatic or hepatoduodenal ligament lymphadenopathy. No retroperitoneal or mesenteric lymphadenopathy. No pelvic sidewall lymphadenopathy. Reproductive: The prostate gland and seminal vesicles are unremarkable. Other: No substantial intraperitoneal free fluid. Musculoskeletal: No worrisome lytic or sclerotic osseous abnormality. IMPRESSION: 1. Markedly nodular liver contour is compatible with cirrhosis, new in the interval. Associated vascular sequelae of portal venous hypertension. 2. Markedly distended gallbladder with some ill definition of the gallbladder wall but no evidence for gallstones. If there is clinical concern for acute cholecystitis, right upper quadrant ultrasound could be  used to further evaluate. 3. Left colonic diverticulosis without diverticulitis. Electronically Signed   By: Camellia Candle M.D.   On: 08/05/2024 05:52      Subjective: Patient seen and examined at bedside today.  Looks very comfortable today.  Alert and oriented.  Lying in bed.  Denies any new  complaints today.  Eager to go home  Discharge Exam: Vitals:   08/11/24 0600 08/11/24 0831  BP:  (!) 103/51  Pulse:  78  Resp: 19 19  Temp:  98.4 F (36.9 C)  SpO2:     Vitals:   08/11/24 0500 08/11/24 0558 08/11/24 0600 08/11/24 0831  BP:  (!) 103/51  (!) 103/51  Pulse:  65  78  Resp: 18 17 19 19   Temp:  98.5 F (36.9 C)  98.4 F (36.9 C)  TempSrc:  Oral  Oral  SpO2:      Weight:  83.7 kg      General: Pt is alert, awake, not in acute distress Cardiovascular: RRR, S1/S2 +, no rubs, no gallops Respiratory: CTA bilaterally, no wheezing, no rhonchi Abdominal: Soft, NT, ND, bowel sounds + Extremities: no edema, no cyanosis    The results of significant diagnostics from this hospitalization (including imaging, microbiology, ancillary and laboratory) are listed below for reference.     Microbiology: No results found for this or any previous visit (from the past 240 hours).   Labs: BNP (last 3 results) No results for input(s): BNP in the last 8760 hours. Basic Metabolic Panel: Recent Labs  Lab 08/05/24 0332 08/06/24 0317 08/07/24 0259 08/08/24 0349 08/09/24 0339 08/10/24 0344  NA 140 141 142 138 137 134*  K 3.8 4.6 3.4* 3.7 3.9 3.9  CL 107 113* 111 106 109 106  CO2 23 17* 21* 22 21* 23  GLUCOSE 155* 172* 144* 106* 113* 111*  BUN 28* 30* 30* 23* 21* 15  CREATININE 0.69 0.85 0.79 0.72 0.78 0.87  CALCIUM 8.9 8.2* 7.9* 8.1* 7.8* 8.0*  MG 2.1  --  2.2 2.1  --   --   PHOS 2.6  --   --  3.1  --   --    Liver Function Tests: Recent Labs  Lab 08/06/24 0317 08/07/24 0259 08/08/24 0349 08/09/24 0339 08/10/24 0344  AST 66* 67* 108* 88* 83*  ALT 31 33 46* 42 45*  ALKPHOS 169* 153* 158* 137* 154*  BILITOT 2.4* 2.5* 3.3* 3.5* 3.1*  PROT 7.0 6.5 6.9 6.0* 5.8*  ALBUMIN 2.6* 2.7* 2.8* 2.5* 2.4*   Recent Labs  Lab 08/05/24 0332  LIPASE 20   Recent Labs  Lab 08/05/24 1606 08/06/24 0820 08/07/24 0259 08/08/24 0349 08/10/24 0344  AMMONIA 145* 110* 69*  54* 50*   CBC: Recent Labs  Lab 08/05/24 0332 08/05/24 1606 08/06/24 0009 08/06/24 0317 08/07/24 0259 08/09/24 0339 08/10/24 0344  WBC 8.5  --   --  6.3 9.8 5.7 6.7  NEUTROABS 5.3  --   --   --   --   --   --   HGB 11.8*   < > 10.0* 10.7* 10.1* 9.8* 9.4*  HCT 33.2*   < > 28.7* 30.6* 29.0* 28.1* 27.2*  MCV 97.6  --   --  100.0 99.7 99.3 99.6  PLT 86*  --   --  71* 77* 63* 66*   < > = values in this interval not displayed.   Cardiac Enzymes: No results for input(s): CKTOTAL, CKMB, CKMBINDEX, TROPONINI in the last 168 hours. BNP:  Invalid input(s): POCBNP CBG: Recent Labs  Lab 08/10/24 0609 08/10/24 1218 08/10/24 1707 08/10/24 2113 08/11/24 0557  GLUCAP 129* 183* 131* 143* 130*   D-Dimer No results for input(s): DDIMER in the last 72 hours. Hgb A1c No results for input(s): HGBA1C in the last 72 hours. Lipid Profile No results for input(s): CHOL, HDL, LDLCALC, TRIG, CHOLHDL, LDLDIRECT in the last 72 hours. Thyroid function studies No results for input(s): TSH, T4TOTAL, T3FREE, THYROIDAB in the last 72 hours.  Invalid input(s): FREET3 Anemia work up No results for input(s): VITAMINB12, FOLATE, FERRITIN, TIBC, IRON, RETICCTPCT in the last 72 hours. Urinalysis    Component Value Date/Time   COLORURINE YELLOW 09/12/2019 1330   APPEARANCEUR CLEAR 09/12/2019 1330   LABSPEC 1.016 09/12/2019 1330   PHURINE 7.0 09/12/2019 1330   GLUCOSEU >=500 (A) 09/12/2019 1330   HGBUR NEGATIVE 09/12/2019 1330   BILIRUBINUR NEGATIVE 09/12/2019 1330   KETONESUR NEGATIVE 09/12/2019 1330   PROTEINUR NEGATIVE 09/12/2019 1330   NITRITE NEGATIVE 09/12/2019 1330   LEUKOCYTESUR NEGATIVE 09/12/2019 1330   Sepsis Labs Recent Labs  Lab 08/06/24 0317 08/07/24 0259 08/09/24 0339 08/10/24 0344  WBC 6.3 9.8 5.7 6.7   Microbiology No results found for this or any previous visit (from the past 240 hours).  Please note: You were cared for by  a hospitalist during your hospital stay. Once you are discharged, your primary care physician will handle any further medical issues. Please note that NO REFILLS for any discharge medications will be authorized once you are discharged, as it is imperative that you return to your primary care physician (or establish a relationship with a primary care physician if you do not have one) for your post hospital discharge needs so that they can reassess your need for medications and monitor your lab values.    Time coordinating discharge: 40 minutes  SIGNED:   Ivonne Mustache, MD  [facility] 08/11/2024, 10:35 AM Pager [phone]  If 7PM-7AM, please contact night-coverage [email or web address] [facility] Physician Discharge Summary  Ian Pham FMW:[id number] DOB: [birth year] DOA: 08/05/2024  PCP: Pcp, No  Admit date: 08/05/2024 Discharge date: 08/11/2024  Admitted From: Home Disposition:  Home  Discharge Condition:Stable CODE STATUS:FULL, DNR, Comfort Care Diet recommendation: Heart Healthy / Carb Modified / Regular / Dysphagia   Brief/Interim Summary:   Following problems were addressed during the hospitalization:   Discharge Diagnoses:  Principal Problem:   Acute upper GI bleed Active Problems:   Liver cirrhosis (HCC)   Essential hypertension   DM II (diabetes mellitus, type II), controlled (HCC)   Alcohol abuse   Gastric ulcer with hemorrhage   Encephalopathy, hepatic (HCC)   Secondary esophageal varices with bleeding [facility])    Discharge Instructions  Discharge Instructions     Discharge instructions   Complete by: As directed    1)Please quit alcohol 2)Take your  medications as instructed 3)Follow up with gastroenterology on the given appointment date 4)Follow up with a PCP in 1 to 2 weeks   Increase activity slowly   Complete by: As directed       Allergies as of 08/11/2024   No Known Allergies      Medication List     TAKE these  medications    carvedilol  3.125 MG tablet Commonly known as: COREG  Take 1 tablet (3.125 mg total) by mouth 2 (two) times daily with a meal.   folic acid  1 MG tablet Commonly known as: FOLVITE  Take 1 tablet (1 mg total) by mouth daily.  Start taking on: August 12, 2024   lactulose  10 GM/15ML solution Commonly known as: CHRONULAC  Take 45 mLs (30 g total) by mouth 3 (three) times daily.   pantoprazole  40 MG tablet Commonly known as: Protonix  Take 1 tablet (40 mg total) by mouth 2 (two) times daily.   rifaximin  550 MG Tabs tablet Commonly known as: XIFAXAN  Take 1 tablet (550 mg total) by mouth 2 (two) times daily.   thiamine  100 MG tablet Commonly known as: VITAMIN B1 Take 1 tablet (100 mg total) by mouth daily.               Durable Medical Equipment  (From admission, onward)           Start     Ordered   08/10/24 0752  For home use only DME Walker rolling  Once       Question Answer Comment  Walker: With 5 Inch Wheels   Patient needs a walker to treat with the following condition Balance disorder      08/10/24 0751            Follow-up Information     Mollie Nestor HERO, PA-C Follow up on 08/17/2024.   Specialty: Gastroenterology Why: 3:00 PM Contact information: [address] [place] [address] [phone]                Allergies[2]  Consultations:    Procedures/Studies: CT ABDOMEN PELVIS W CONTRAST Result Date: 08/05/2024 CLINICAL DATA:  Abdominal pain.  Vomiting. EXAM: CT ABDOMEN AND PELVIS WITH CONTRAST TECHNIQUE: Multidetector CT imaging of the abdomen and pelvis was performed using the standard protocol following bolus administration of intravenous contrast. RADIATION DOSE REDUCTION: This exam was performed according to the departmental dose-optimization program which includes automated exposure control, adjustment of the mA and/or kV according to patient size and/or use of iterative reconstruction technique. CONTRAST:  75mL  OMNIPAQUE  IOHEXOL  350 MG/ML SOLN COMPARISON:  09/24/2017 FINDINGS: Lower chest: Dependent atelectasis noted in the lung bases. Hepatobiliary: Markedly nodular liver contour is compatible with cirrhosis, new in the interval. Gallbladder is markedly distended with some ill definition of the gallbladder wall but no evidence for gallstones. No intrahepatic or extrahepatic biliary dilation. Pancreas: No focal mass lesion. No dilatation of the main duct. No intraparenchymal cyst. No peripancreatic edema. Spleen: No splenomegaly. No suspicious focal mass lesion. Adrenals/Urinary Tract: No adrenal nodule or mass. Kidneys unremarkable. No evidence for hydroureter. The urinary bladder appears normal for the degree of distention. Stomach/Bowel: Stomach is unremarkable. No gastric wall thickening. No evidence of outlet obstruction. Duodenum is normally positioned as is the ligament of Treitz. No small bowel wall thickening. No small bowel dilatation. The terminal ileum is normal. The appendix is normal. No gross colonic mass. No colonic wall thickening. Diverticular changes are noted in the left colon without evidence of diverticulitis. 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 Extensive collateralization noted in the left upper quadrant. Paraumbilical vein is been recanalized. No abdominal aortic aneurysm. No abdominal aortic atherosclerotic calcification. There is no gastrohepatic or hepatoduodenal ligament lymphadenopathy. No retroperitoneal or mesenteric lymphadenopathy. No pelvic sidewall lymphadenopathy. Reproductive: The prostate gland and seminal vesicles are unremarkable. Other: No substantial intraperitoneal free fluid. Musculoskeletal: No worrisome lytic or sclerotic osseous abnormality. IMPRESSION: 1. Markedly nodular liver contour is compatible with cirrhosis, new in the interval. Associated vascular sequelae of portal venous hypertension. 2. Markedly distended gallbladder  with some ill definition of the gallbladder wall but no evidence for gallstones. If there is  clinical concern for acute cholecystitis, right upper quadrant ultrasound could be used to further evaluate. 3. Left colonic diverticulosis without diverticulitis. Electronically Signed   By: Camellia Candle M.D.   On: 08/05/2024 05:52      Subjective:   Discharge Exam: Vitals:   08/11/24 0600 08/11/24 0831  BP:  (!) 103/51  Pulse:  78  Resp: 19 19  Temp:  98.4 F (36.9 C)  SpO2:     Vitals:   08/11/24 0500 08/11/24 0558 08/11/24 0600 08/11/24 0831  BP:  (!) 103/51  (!) 103/51  Pulse:  65  78  Resp: 18 17 19 19   Temp:  98.5 F (36.9 C)  98.4 F (36.9 C)  TempSrc:  Oral  Oral  SpO2:      Weight:  83.7 kg      General: Pt is alert, awake, not in acute distress Cardiovascular: RRR, S1/S2 +, no rubs, no gallops Respiratory: CTA bilaterally, no wheezing, no rhonchi Abdominal: Soft, NT, ND, bowel sounds + Extremities: no edema, no cyanosis    The results of significant diagnostics from this hospitalization (including imaging, microbiology, ancillary and laboratory) are listed below for reference.     Microbiology: No results found for this or any previous visit (from the past 240 hours).   Labs: BNP (last 3 results) No results for input(s): BNP in the last 8760 hours. Basic Metabolic Panel: Recent Labs  Lab 08/05/24 0332 08/06/24 0317 08/07/24 0259 08/08/24 0349 08/09/24 0339 08/10/24 0344  NA 140 141 142 138 137 134*  K 3.8 4.6 3.4* 3.7 3.9 3.9  CL 107 113* 111 106 109 106  CO2 23 17* 21* 22 21* 23  GLUCOSE 155* 172* 144* 106* 113* 111*  BUN 28* 30* 30* 23* 21* 15  CREATININE 0.69 0.85 0.79 0.72 0.78 0.87  CALCIUM 8.9 8.2* 7.9* 8.1* 7.8* 8.0*  MG 2.1  --  2.2 2.1  --   --   PHOS 2.6  --   --  3.1  --   --    Liver Function Tests: Recent Labs  Lab 08/06/24 0317 08/07/24 0259 08/08/24 0349 08/09/24 0339 08/10/24 0344  AST 66* 67* 108* 88* 83*  ALT 31 33  46* 42 45*  ALKPHOS 169* 153* 158* 137* 154*  BILITOT 2.4* 2.5* 3.3* 3.5* 3.1*  PROT 7.0 6.5 6.9 6.0* 5.8*  ALBUMIN 2.6* 2.7* 2.8* 2.5* 2.4*   Recent Labs  Lab 08/05/24 0332  LIPASE 20   Recent Labs  Lab 08/05/24 1606 08/06/24 0820 08/07/24 0259 08/08/24 0349 08/10/24 0344  AMMONIA 145* 110* 69* 54* 50*   CBC: Recent Labs  Lab 08/05/24 0332 08/05/24 1606 08/06/24 0009 08/06/24 0317 08/07/24 0259 08/09/24 0339 08/10/24 0344  WBC 8.5  --   --  6.3 9.8 5.7 6.7  NEUTROABS 5.3  --   --   --   --   --   --   HGB 11.8*   < > 10.0* 10.7* 10.1* 9.8* 9.4*  HCT 33.2*   < > 28.7* 30.6* 29.0* 28.1* 27.2*  MCV 97.6  --   --  100.0 99.7 99.3 99.6  PLT 86*  --   --  71* 77* 63* 66*   < > = values in this interval not displayed.   Cardiac Enzymes: No results for input(s): CKTOTAL, CKMB, CKMBINDEX, TROPONINI in the last 168 hours. BNP: Invalid input(s): POCBNP CBG: Recent Labs  Lab 08/10/24 0609 08/10/24 1218 08/10/24 1707 08/10/24 2113 08/11/24 0557  GLUCAP  129* 183* 131* 143* 130*   D-Dimer No results for input(s): DDIMER in the last 72 hours. Hgb A1c No results for input(s): HGBA1C in the last 72 hours. Lipid Profile No results for input(s): CHOL, HDL, LDLCALC, TRIG, CHOLHDL, LDLDIRECT in the last 72 hours. Thyroid function studies No results for input(s): TSH, T4TOTAL, T3FREE, THYROIDAB in the last 72 hours.  Invalid input(s): FREET3 Anemia work up No results for input(s): VITAMINB12, FOLATE, FERRITIN, TIBC, IRON, RETICCTPCT in the last 72 hours. Urinalysis    Component Value Date/Time   COLORURINE YELLOW 09/12/2019 1330   APPEARANCEUR CLEAR 09/12/2019 1330   LABSPEC 1.016 09/12/2019 1330   PHURINE 7.0 09/12/2019 1330   GLUCOSEU >=500 (A) 09/12/2019 1330   HGBUR NEGATIVE 09/12/2019 1330   BILIRUBINUR NEGATIVE 09/12/2019 1330   KETONESUR NEGATIVE 09/12/2019 1330   PROTEINUR NEGATIVE 09/12/2019 1330   NITRITE  NEGATIVE 09/12/2019 1330   LEUKOCYTESUR NEGATIVE 09/12/2019 1330   Sepsis Labs Recent Labs  Lab 08/06/24 0317 08/07/24 0259 08/09/24 0339 08/10/24 0344  WBC 6.3 9.8 5.7 6.7   Microbiology No results found for this or any previous visit (from the past 240 hours).  Please note: You were cared for by a hospitalist during your hospital stay. Once you are discharged, your primary care physician will handle any further medical issues. Please note that NO REFILLS for any discharge medications will be authorized once you are discharged, as it is imperative that you return to your primary care physician (or establish a relationship with a primary care physician if you do not have one) for your post hospital discharge needs so that they can reassess your need for medications and monitor your lab values.    Time coordinating discharge: 40 minutes  SIGNED:   Ivonne Mustache, MD  [facility] 08/11/2024, 10:35 AM Pager [phone]  If 7PM-7AM, please contact night-coverage [email or web address] Password [email or web address]    [1] No Known Allergies [2] No Known Allergies

## 2024-08-11 NOTE — Plan of Care (Signed)
" °  Problem: Skin Integrity: Goal: Risk for impaired skin integrity will decrease Outcome: Progressing   Problem: Clinical Measurements: Goal: Respiratory complications will improve Outcome: Progressing   Problem: Activity: Goal: Risk for activity intolerance will decrease Outcome: Progressing   Problem: Pain Managment: Goal: General experience of comfort will improve and/or be controlled Outcome: Progressing   Problem: Elimination: Goal: Will not experience complications related to bowel motility Outcome: Not Progressing   "

## 2024-08-11 NOTE — TOC CM/SW Note (Signed)
" °  [facility] Medication Assistance Card  Name: Kamarii Carton Patient DOB: 03-Dec-1965  ID (MRN): [id number]  RX Bin: [id number] RX Group: [id number] RX PCN: [id number]  Person Code: 01 Relationship Code: 1-Cardholder  Discharge Date: 08/11/2024  Expiration Date: 08/19/2024  (must be filled within 7 days of discharge)     (must be filled within 7 days of discharge)   "

## 2024-08-11 NOTE — TOC Transition Note (Signed)
 Transition of Care (TOC) - Discharge Note Rayfield Gobble RN, BSN Inpatient Care Management Unit 4E- RN Case Manager See Treatment Team for direct phone #   Patient Details  Name: Ian Pham MRN: [id number] Date of Birth: 07-Oct-1965  Transition of Care [facility]) CM/SW Contact:  Gobble Rayfield Hurst, RN Phone Number: 08/11/2024, 11:12 AM   Clinical Narrative:    Pt stable for transition home today. CM spoke with pt on 1/15- regarding financial assistance. Email sent to [facility] CANDIE Molt to review for Medicaid eligibility.   Per updated PT/OT notes no HH or DME needs recommended- No referrals needed on discharge.  Pt does not have a PCP- msg sent to [facility] to assist in scheduling a PCP f/u appointment- info will be placed on AVS and/or mailed to pt.   Wife to transport home.   TOC pharmacy to fill meds-  MATCH letter done and placed in epic.   SA resources placed on AVS.   No further ICM needs noted   Final next level of care: Home/Self Care Barriers to Discharge: Barriers Resolved   Patient Goals and CMS Choice Patient states their goals for this hospitalization and ongoing recovery are:: return home   Choice offered to / list presented to : NA      Discharge Placement               home        Discharge Plan and Services Additional resources added to the After Visit Summary for   In-house Referral: Clinical Social Work Discharge Planning Services: CM Consult, Follow-up appt scheduled, MATCH Program, Medication Assistance Post Acute Care Choice: NA          DME Arranged: Walker rolling (Per updated PT note- no DME needed) DME Agency: NA       HH Arranged: NA HH Agency: NA        Social Drivers of Health (SDOH) Interventions SDOH Screenings   Tobacco Use: Medium Risk (08/05/2024)     Readmission Risk Interventions    08/11/2024   11:11 AM  Readmission Risk Prevention Plan  Medication Screening Complete  Transportation Screening  Complete

## 2024-08-17 ENCOUNTER — Ambulatory Visit: Payer: Self-pay | Admitting: Gastroenterology

## 2024-08-28 ENCOUNTER — Inpatient Hospital Stay (HOSPITAL_COMMUNITY)
Admission: EM | Admit: 2024-08-28 | Discharge: 2024-09-01 | Disposition: A | Payer: MEDICAID | Source: Home / Self Care | Attending: Internal Medicine | Admitting: Internal Medicine

## 2024-08-28 ENCOUNTER — Other Ambulatory Visit: Payer: Self-pay

## 2024-08-28 ENCOUNTER — Emergency Department (HOSPITAL_COMMUNITY): Payer: MEDICAID

## 2024-08-28 ENCOUNTER — Encounter (HOSPITAL_COMMUNITY): Payer: Self-pay | Admitting: Internal Medicine

## 2024-08-28 DIAGNOSIS — K7030 Alcoholic cirrhosis of liver without ascites: Secondary | ICD-10-CM

## 2024-08-28 DIAGNOSIS — I10 Essential (primary) hypertension: Secondary | ICD-10-CM | POA: Diagnosis present

## 2024-08-28 DIAGNOSIS — E119 Type 2 diabetes mellitus without complications: Secondary | ICD-10-CM

## 2024-08-28 DIAGNOSIS — R519 Headache, unspecified: Principal | ICD-10-CM | POA: Diagnosis present

## 2024-08-28 DIAGNOSIS — K7460 Unspecified cirrhosis of liver: Secondary | ICD-10-CM | POA: Diagnosis present

## 2024-08-28 DIAGNOSIS — K7682 Hepatic encephalopathy: Secondary | ICD-10-CM | POA: Diagnosis present

## 2024-08-28 LAB — COMPREHENSIVE METABOLIC PANEL WITH GFR
ALT: 38 U/L (ref 0–44)
AST: 64 U/L — ABNORMAL HIGH (ref 15–41)
Albumin: 2.9 g/dL — ABNORMAL LOW (ref 3.5–5.0)
Alkaline Phosphatase: 334 U/L — ABNORMAL HIGH (ref 38–126)
Anion gap: 9 (ref 5–15)
BUN: 12 mg/dL (ref 6–20)
CO2: 21 mmol/L — ABNORMAL LOW (ref 22–32)
Calcium: 8.9 mg/dL (ref 8.9–10.3)
Chloride: 109 mmol/L (ref 98–111)
Creatinine, Ser: 0.6 mg/dL — ABNORMAL LOW (ref 0.61–1.24)
GFR, Estimated: >60 mL/min
Glucose, Bld: 150 mg/dL — ABNORMAL HIGH (ref 70–99)
Potassium: 4.2 mmol/L (ref 3.5–5.1)
Sodium: 138 mmol/L (ref 135–145)
Total Bilirubin: 2.7 mg/dL — ABNORMAL HIGH (ref 0.0–1.2)
Total Protein: 7.1 g/dL (ref 6.5–8.1)

## 2024-08-28 LAB — CBC WITH DIFFERENTIAL/PLATELET
Abs Immature Granulocytes: 0.01 10*3/uL (ref 0.00–0.07)
Basophils Absolute: 0 10*3/uL (ref 0.0–0.1)
Basophils Relative: 1 %
Eosinophils Absolute: 0.2 10*3/uL (ref 0.0–0.5)
Eosinophils Relative: 3 %
HCT: 32.3 % — ABNORMAL LOW (ref 39.0–52.0)
Hemoglobin: 11.2 g/dL — ABNORMAL LOW (ref 13.0–17.0)
Immature Granulocytes: 0 %
Lymphocytes Relative: 25 %
Lymphs Abs: 1.7 10*3/uL (ref 0.7–4.0)
MCH: 32.5 pg (ref 26.0–34.0)
MCHC: 34.7 g/dL (ref 30.0–36.0)
MCV: 93.6 fL (ref 80.0–100.0)
Monocytes Absolute: 0.6 10*3/uL (ref 0.1–1.0)
Monocytes Relative: 9 %
Neutro Abs: 4.3 10*3/uL (ref 1.7–7.7)
Neutrophils Relative %: 62 %
Platelets: 99 10*3/uL — ABNORMAL LOW (ref 150–400)
RBC: 3.45 MIL/uL — ABNORMAL LOW (ref 4.22–5.81)
RDW: 14.8 % (ref 11.5–15.5)
Smear Review: NORMAL
WBC: 6.7 10*3/uL (ref 4.0–10.5)
nRBC: 0 % (ref 0.0–0.2)

## 2024-08-28 LAB — GLUCOSE, CAPILLARY: Glucose-Capillary: 88 mg/dL (ref 70–99)

## 2024-08-28 LAB — ETHANOL: Alcohol, Ethyl (B): <15 mg/dL

## 2024-08-28 LAB — AMMONIA: Ammonia: 108 umol/L — ABNORMAL HIGH (ref 9–35)

## 2024-08-28 MED ORDER — METOCLOPRAMIDE HCL 5 MG/ML IJ SOLN
10.0000 mg | Freq: Once | INTRAMUSCULAR | Status: AC
  Administered 2024-08-28: 10 mg via INTRAVENOUS
  Filled 2024-08-28: qty 2

## 2024-08-28 MED ORDER — SODIUM CHLORIDE 0.9 % IV BOLUS
1000.0000 mL | Freq: Once | INTRAVENOUS | Status: AC
  Administered 2024-08-28: 1000 mL via INTRAVENOUS

## 2024-08-28 MED ORDER — SENNOSIDES-DOCUSATE SODIUM 8.6-50 MG PO TABS: 1.0000 | ORAL_TABLET | Freq: Every evening | ORAL | Status: DC | PRN

## 2024-08-28 MED ORDER — ONDANSETRON HCL 4 MG/2ML IJ SOLN
4.0000 mg | Freq: Four times a day (QID) | INTRAMUSCULAR | Status: DC | PRN
  Administered 2024-08-30: 4 mg via INTRAVENOUS
  Filled 2024-08-28: qty 2

## 2024-08-28 MED ORDER — SODIUM CHLORIDE 0.9% FLUSH
3.0000 mL | Freq: Two times a day (BID) | INTRAVENOUS | Status: DC
  Administered 2024-08-29 – 2024-09-01 (×6): 3 mL via INTRAVENOUS

## 2024-08-28 MED ORDER — FOLIC ACID 1 MG PO TABS
1.0000 mg | ORAL_TABLET | Freq: Every day | ORAL | Status: DC
  Administered 2024-08-28 – 2024-09-01 (×5): 1 mg via ORAL
  Filled 2024-08-28 (×5): qty 1

## 2024-08-28 MED ORDER — THIAMINE HCL 100 MG/ML IJ SOLN
100.0000 mg | Freq: Every day | INTRAMUSCULAR | Status: DC
  Administered 2024-08-28: 100 mg via INTRAVENOUS
  Filled 2024-08-28: qty 2

## 2024-08-28 MED ORDER — LORAZEPAM 1 MG PO TABS
1.0000 mg | ORAL_TABLET | ORAL | Status: DC | PRN
  Administered 2024-08-29: 1 mg via ORAL
  Filled 2024-08-28: qty 1

## 2024-08-28 MED ORDER — HYDROMORPHONE HCL 1 MG/ML IJ SOLN
0.5000 mg | Freq: Once | INTRAMUSCULAR | Status: AC
  Administered 2024-08-28: 0.5 mg via INTRAVENOUS
  Filled 2024-08-28: qty 1

## 2024-08-28 MED ORDER — ALBUTEROL SULFATE (2.5 MG/3ML) 0.083% IN NEBU: 2.5000 mg | INHALATION_SOLUTION | RESPIRATORY_TRACT | Status: DC | PRN

## 2024-08-28 MED ORDER — KETOROLAC TROMETHAMINE 15 MG/ML IJ SOLN
15.0000 mg | Freq: Once | INTRAMUSCULAR | Status: AC
  Administered 2024-08-28: 15 mg via INTRAVENOUS
  Filled 2024-08-28: qty 1

## 2024-08-28 MED ORDER — ACETAMINOPHEN 650 MG RE SUPP: 650.0000 mg | Freq: Four times a day (QID) | RECTAL | Status: DC | PRN

## 2024-08-28 MED ORDER — CARVEDILOL 3.125 MG PO TABS
3.1250 mg | ORAL_TABLET | Freq: Two times a day (BID) | ORAL | Status: DC
  Administered 2024-08-29 – 2024-09-01 (×7): 3.125 mg via ORAL
  Filled 2024-08-28 (×7): qty 1

## 2024-08-28 MED ORDER — LACTULOSE 10 GM/15ML PO SOLN
30.0000 g | Freq: Once | ORAL | Status: AC
  Administered 2024-08-28: 30 g via ORAL
  Filled 2024-08-28: qty 60

## 2024-08-28 MED ORDER — BUTALBITAL-APAP-CAFFEINE 50-325-40 MG PO TABS
1.0000 | ORAL_TABLET | Freq: Once | ORAL | Status: AC
  Administered 2024-08-28: 1 via ORAL
  Filled 2024-08-28: qty 1

## 2024-08-28 MED ORDER — PANTOPRAZOLE SODIUM 40 MG PO TBEC
40.0000 mg | DELAYED_RELEASE_TABLET | Freq: Two times a day (BID) | ORAL | Status: DC
  Administered 2024-08-28 – 2024-09-01 (×8): 40 mg via ORAL
  Filled 2024-08-28 (×8): qty 1

## 2024-08-28 MED ORDER — ACETAMINOPHEN 500 MG PO TABS: 500.0000 mg | ORAL_TABLET | Freq: Two times a day (BID) | ORAL | Status: DC | PRN

## 2024-08-28 MED ORDER — ONDANSETRON HCL 4 MG PO TABS: 4.0000 mg | ORAL_TABLET | Freq: Four times a day (QID) | ORAL | Status: DC | PRN

## 2024-08-28 MED ORDER — OXYCODONE HCL 5 MG PO TABS: 5.0000 mg | ORAL_TABLET | ORAL | Status: DC | PRN

## 2024-08-28 MED ORDER — LACTULOSE 10 GM/15ML PO SOLN
30.0000 g | Freq: Three times a day (TID) | ORAL | Status: DC
  Administered 2024-08-28 – 2024-09-01 (×11): 30 g via ORAL
  Filled 2024-08-28 (×7): qty 45
  Filled 2024-08-28: qty 60
  Filled 2024-08-28 (×3): qty 45

## 2024-08-28 MED ORDER — ADULT MULTIVITAMIN W/MINERALS CH
1.0000 | ORAL_TABLET | Freq: Every day | ORAL | Status: DC
  Administered 2024-08-28 – 2024-09-01 (×5): 1 via ORAL
  Filled 2024-08-28 (×5): qty 1

## 2024-08-28 MED ORDER — RIFAXIMIN 550 MG PO TABS
550.0000 mg | ORAL_TABLET | Freq: Two times a day (BID) | ORAL | Status: DC
  Administered 2024-08-29 – 2024-09-01 (×7): 550 mg via ORAL
  Filled 2024-08-28 (×7): qty 1

## 2024-08-28 MED ORDER — LORAZEPAM 2 MG/ML IJ SOLN: 1.0000 mg | INTRAMUSCULAR | Status: DC | PRN

## 2024-08-28 MED ORDER — DIPHENHYDRAMINE HCL 50 MG/ML IJ SOLN
12.5000 mg | Freq: Once | INTRAMUSCULAR | Status: AC
  Administered 2024-08-28: 12.5 mg via INTRAVENOUS
  Filled 2024-08-28: qty 1

## 2024-08-28 NOTE — ED Triage Notes (Signed)
 Per EMS:  CBG 250 162/88 78 18 99% RA

## 2024-08-29 ENCOUNTER — Inpatient Hospital Stay (HOSPITAL_COMMUNITY): Payer: MEDICAID

## 2024-08-29 LAB — COMPREHENSIVE METABOLIC PANEL WITH GFR
ALT: 29 U/L (ref 0–44)
AST: 48 U/L — ABNORMAL HIGH (ref 15–41)
Albumin: 2.4 g/dL — ABNORMAL LOW (ref 3.5–5.0)
Alkaline Phosphatase: 228 U/L — ABNORMAL HIGH (ref 38–126)
Anion gap: 8 (ref 5–15)
BUN: 15 mg/dL (ref 6–20)
CO2: 21 mmol/L — ABNORMAL LOW (ref 22–32)
Calcium: 8.5 mg/dL — ABNORMAL LOW (ref 8.9–10.3)
Chloride: 113 mmol/L — ABNORMAL HIGH (ref 98–111)
Creatinine, Ser: 0.73 mg/dL (ref 0.61–1.24)
GFR, Estimated: >60 mL/min
Glucose, Bld: 94 mg/dL (ref 70–99)
Potassium: 3.6 mmol/L (ref 3.5–5.1)
Sodium: 141 mmol/L (ref 135–145)
Total Bilirubin: 2.8 mg/dL — ABNORMAL HIGH (ref 0.0–1.2)
Total Protein: 5.9 g/dL — ABNORMAL LOW (ref 6.5–8.1)

## 2024-08-29 LAB — AMMONIA: Ammonia: 57 umol/L — ABNORMAL HIGH (ref 9–35)

## 2024-08-29 LAB — CBC
HCT: 27.3 % — ABNORMAL LOW (ref 39.0–52.0)
Hemoglobin: 9.1 g/dL — ABNORMAL LOW (ref 13.0–17.0)
MCH: 32.6 pg (ref 26.0–34.0)
MCHC: 33.3 g/dL (ref 30.0–36.0)
MCV: 97.8 fL (ref 80.0–100.0)
Platelets: 85 10*3/uL — ABNORMAL LOW (ref 150–400)
RBC: 2.79 MIL/uL — ABNORMAL LOW (ref 4.22–5.81)
RDW: 15 % (ref 11.5–15.5)
WBC: 5.4 10*3/uL (ref 4.0–10.5)
nRBC: 0 % (ref 0.0–0.2)

## 2024-08-29 LAB — URINALYSIS, ROUTINE W REFLEX MICROSCOPIC
Bilirubin Urine: NEGATIVE
Glucose, UA: NEGATIVE mg/dL
Hgb urine dipstick: NEGATIVE
Ketones, ur: 5 mg/dL — AB
Leukocytes,Ua: NEGATIVE
Nitrite: NEGATIVE
Protein, ur: NEGATIVE mg/dL
Specific Gravity, Urine: 1.02 (ref 1.005–1.030)
pH: 5 (ref 5.0–8.0)

## 2024-08-29 LAB — HIV ANTIBODY (ROUTINE TESTING W REFLEX): HIV Screen 4th Generation wRfx: NONREACTIVE

## 2024-08-29 LAB — MAGNESIUM: Magnesium: 2.1 mg/dL (ref 1.7–2.4)

## 2024-08-29 MED ORDER — GADOBUTROL 1 MMOL/ML IV SOLN
8.0000 mL | Freq: Once | INTRAVENOUS | Status: AC | PRN
  Administered 2024-08-29: 8 mL via INTRAVENOUS

## 2024-08-29 MED ORDER — AMITRIPTYLINE HCL 10 MG PO TABS
10.0000 mg | ORAL_TABLET | Freq: Every day | ORAL | Status: DC
  Administered 2024-08-29 – 2024-08-30 (×2): 10 mg via ORAL
  Filled 2024-08-29 (×3): qty 1

## 2024-08-29 MED ORDER — THIAMINE MONONITRATE 100 MG PO TABS
100.0000 mg | ORAL_TABLET | Freq: Every day | ORAL | Status: DC
  Administered 2024-08-29 – 2024-09-01 (×4): 100 mg via ORAL
  Filled 2024-08-29 (×4): qty 1

## 2024-08-29 NOTE — Progress Notes (Addendum)
" °   08/29/24 1532  TOC Brief Assessment  Insurance and Status Lapsed  Patient has primary care physician No  Home environment has been reviewed from home with spouse  Prior level of function: Independent  Prior/Current Home Services No current home services  Social Drivers of Health Review SDOH reviewed no interventions necessary  Readmission risk has been reviewed Yes  Transition of care needs transition of care needs identified, TOC will continue to follow   Substance abuse resources provided on AVS.  "

## 2024-08-29 NOTE — Plan of Care (Signed)
  Problem: Coping: Goal: Ability to adjust to condition or change in health will improve Outcome: Progressing   Problem: Nutritional: Goal: Maintenance of adequate nutrition will improve Outcome: Progressing   Problem: Skin Integrity: Goal: Risk for impaired skin integrity will decrease Outcome: Progressing   Problem: Safety: Goal: Ability to remain free from injury will improve Outcome: Progressing

## 2024-08-30 LAB — CBC
HCT: 28.2 % — ABNORMAL LOW (ref 39.0–52.0)
Hemoglobin: 9.5 g/dL — ABNORMAL LOW (ref 13.0–17.0)
MCH: 32.8 pg (ref 26.0–34.0)
MCHC: 33.7 g/dL (ref 30.0–36.0)
MCV: 97.2 fL (ref 80.0–100.0)
Platelets: 81 10*3/uL — ABNORMAL LOW (ref 150–400)
RBC: 2.9 MIL/uL — ABNORMAL LOW (ref 4.22–5.81)
RDW: 14.9 % (ref 11.5–15.5)
WBC: 5.6 10*3/uL (ref 4.0–10.5)
nRBC: 0 % (ref 0.0–0.2)

## 2024-08-30 LAB — COMPREHENSIVE METABOLIC PANEL WITH GFR
ALT: 30 U/L (ref 0–44)
AST: 53 U/L — ABNORMAL HIGH (ref 15–41)
Albumin: 2.5 g/dL — ABNORMAL LOW (ref 3.5–5.0)
Alkaline Phosphatase: 242 U/L — ABNORMAL HIGH (ref 38–126)
Anion gap: 6 (ref 5–15)
BUN: 11 mg/dL (ref 6–20)
CO2: 22 mmol/L (ref 22–32)
Calcium: 8.4 mg/dL — ABNORMAL LOW (ref 8.9–10.3)
Chloride: 108 mmol/L (ref 98–111)
Creatinine, Ser: 0.7 mg/dL (ref 0.61–1.24)
GFR, Estimated: >60 mL/min
Glucose, Bld: 125 mg/dL — ABNORMAL HIGH (ref 70–99)
Potassium: 3.8 mmol/L (ref 3.5–5.1)
Sodium: 136 mmol/L (ref 135–145)
Total Bilirubin: 1.6 mg/dL — ABNORMAL HIGH (ref 0.0–1.2)
Total Protein: 6.2 g/dL — ABNORMAL LOW (ref 6.5–8.1)

## 2024-08-30 LAB — AMMONIA: Ammonia: 157 umol/L — ABNORMAL HIGH (ref 9–35)

## 2024-08-30 LAB — GLUCOSE, CAPILLARY: Glucose-Capillary: 93 mg/dL (ref 70–99)

## 2024-08-30 NOTE — Hospital Course (Signed)
 59 y.o. male with past medical history significant for EtOH cirrhosis with esophageal varices, history of UGIB secondary to gastric ulcer, DM2, thrombocytopenia, EtOH use disorder who presented to [facility] ED on 08/28/2024 via EMS from home with headache, blurred vision, dizziness.  Onset over the last 3 days.  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  Denies any exposures to hazardous chemicals/materials.  Headache localized to his occipital region, constant.  No known exacerbating or alleviating factors.  Denies vomiting/diarrhea, no chest pain, no shortness of breath, no abdominal pain, no fever/chills/night sweats, no cough/congestion, no focal weakness, no fatigue, no paresthesias.   In the ED, temperature 98.5 F, HR 99, RR 20, BP 146/79, SpO2 1% room air.  WBC 6.7, hemoglobin 11.2, platelet count 99.  Sodium 138, potassium 4.2, chloride 109, CO2 21, glucose 150, BUN 12, creatinine 0.60.  AST 64, ALT 38, total bilirubin 2.7.  EtOH level less than 15.  Ammonia level 108.  CT head without contrast with no acute intracranial pathology.  Patient was given 1 L NS bolus, Toradol  50 mg IV x 1.  TRH consulted for admission for further evaluation and management of hepatic encephalopathy, headache.

## 2024-08-30 NOTE — Progress Notes (Signed)
 " PROGRESS NOTE    Ian Pham  FMW:[id number] DOB: 1966-01-07 DOA: 08/28/2024 PCP: Pcp, No    Brief Narrative:   Ian Pham is a 59 y.o. male with past medical history significant for EtOH cirrhosis with esophageal varices, history of UGIB secondary to gastric ulcer, DM2, thrombocytopenia, EtOH use disorder who presented to [facility] ED on 08/28/2024 via EMS from home with headache, blurred vision, dizziness.  Onset over the last 3 days.  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  Denies any exposures to hazardous chemicals/materials.  Headache localized to his occipital region, constant.  No known exacerbating or alleviating factors.  Denies vomiting/diarrhea, no chest pain, no shortness of breath, no abdominal pain, no fever/chills/night sweats, no cough/congestion, no focal weakness, no fatigue, no paresthesias.  In the ED, temperature 98.5 F, HR 99, RR 20, BP 146/79, SpO2 1% room air.  WBC 6.7, hemoglobin 11.2, platelet count 99.  Sodium 138, potassium 4.2, chloride 109, CO2 21, glucose 150, BUN 12, creatinine 0.60.  AST 64, ALT 38, total bilirubin 2.7.  EtOH level less than 15.  Ammonia level 108.  CT head without contrast with no acute intracranial pathology.  Patient was given 1 L NS bolus, Toradol  50 mg IV x 1.  TRH consulted for admission for further evaluation and management of hepatic encephalopathy, headache.  Assessment & Plan:   Occipital headache associated with blurred vision Patient presenting with 3-day history of constant occipital headache associated with dizziness, blurred vision.  No other focal neurological deficit.  No meningismus on physical exam.  Patient is afebrile without leukocytosis.  CT head with no acute intracranial abnormality.  Toradol , Fioricet , Reglan  given without effect.  Etiology likely secondary to tension versus occipital neuralgia. -- MRI brain with and without contrast: normal -- Need to avoid  NSAIDs/Tylenol  given underlying cirrhosis and recent UGIB secondary to gastric ulcer -- Continue Amitriptyline  10 mg p.o. nightly -- Oxycodone  5 mg p.o. every 4 hours as needed severe pain  Hepatic encephalopathy EtOH cirrhosis with esophageal varices Elevated bilirubin/transaminases, chronic EtOH level elevated on admission 108.  Etiology likely secondary to noncompliance with his home medications including lactulose  and rifaximin . -- Ammonia 108>57> 157 -- Coreg  3.125 mg PO BID -- Lactulose  30 g p.o. 3 times daily, goal 2-3 soft BMs daily -- Rifaximin  550 mg p.o. twice daily  -- Protonix  40 mg p.o. twice daily -- Strict I's and O's, monitor bowel movements -- CMP in am  **Patient was given multiple month prescriptions for rifaximin , Protonix , lactulose  during recent hospitalization at [facility] per pharmacy**  Hx UGIB 2/2 gastric ulcer -- Hgb 11.2>9.1 -- Protonix  40 mg PO BID -- CBC in am  Thrombocytopenia Etiology likely secondary to chronic alcohol use disorder.   -- Plt 99>85>81, stable. -- CBC in am  DM2 Hemoglobin A1c 5.5% on 08/05/2024.  Diet controlled at baseline. -- Monitor off of insulin  for now -- CBG before every meal/at bedtime  EtOH use disorder EtOH level on admission less than 15. -- CIWA protocol with symptom triggered Ativan  -- Thiamine , folate, multivitamin   DVT prophylaxis: SCDs Start: 08/28/24 2058    Code Status: Full Code Family Communication: No family present at bedside this morning  Disposition Plan:  Level of care: Telemetry Status is: Inpatient Remains inpatient appropriate because: MRI brain    Consultants:  None  Procedures:  None  Antimicrobials:  None   Subjective: Patient seen with interpreter.  He has no new symptoms.,  Feeling slightly  better, mild headache, improved from previous  Objective: Vitals:   08/30/24 1023 08/30/24 1024 08/30/24 1300 08/30/24 1701  BP: 122/64 122/64 106/66 120/68  Pulse: 77 77 72 71   Resp:   16   Temp:   98.1 F (36.7 C)   TempSrc:   Oral   SpO2:   96% 97%  Weight:        Intake/Output Summary (Last 24 hours) at 08/30/2024 1805 Last data filed at 08/30/2024 1300 Gross per 24 hour  Intake 723 ml  Output --  Net 723 ml   Filed Weights   08/28/24 2245  Weight: 81.4 kg    Examination:  Physical Exam: GEN: NAD, alert and oriented x 3, chronically ill/disheveled in appearance, appears older than stated age HEENT: NCAT, PERRL, EOMI, sclera clear, MMM PULM: CTAB w/o wheezes/crackles, normal respiratory effort, room air CV: RRR w/o M/G/R GI: abd soft, NTND, + BS MSK: no peripheral edema, moves all extremities independently with preserved muscle strength NEURO: No focal neurological deficit PSYCH: normal mood/affect Integumentary: No concerning rashes/lesions/wounds nonexposed skin surfaces    Data Reviewed: I have personally reviewed following labs and imaging studies  CBC: Recent Labs  Lab 08/28/24 1416 08/29/24 0646 08/30/24 0633  WBC 6.7 5.4 5.6  NEUTROABS 4.3  --   --   HGB 11.2* 9.1* 9.5*  HCT 32.3* 27.3* 28.2*  MCV 93.6 97.8 97.2  PLT 99* 85* 81*   Basic Metabolic Panel: Recent Labs  Lab 08/28/24 1416 08/29/24 0646 08/30/24 0633  NA 138 141 136  K 4.2 3.6 3.8  CL 109 113* 108  CO2 21* 21* 22  GLUCOSE 150* 94 125*  BUN 12 15 11   CREATININE 0.60* 0.73 0.70  CALCIUM 8.9 8.5* 8.4*  MG  --  2.1  --    GFR: CrCl cannot be calculated (Unknown ideal weight.). Liver Function Tests: Recent Labs  Lab 08/28/24 1416 08/29/24 0646 08/30/24 0633  AST 64* 48* 53*  ALT 38 29 30  ALKPHOS 334* 228* 242*  BILITOT 2.7* 2.8* 1.6*  PROT 7.1 5.9* 6.2*  ALBUMIN 2.9* 2.4* 2.5*   No results for input(s): LIPASE, AMYLASE in the last 168 hours. Recent Labs  Lab 08/28/24 1727 08/29/24 0646 08/30/24 0633  AMMONIA 108* 57* 157*   Coagulation Profile: No results for input(s): INR, PROTIME in the last 168 hours. Cardiac Enzymes: No  results for input(s): CKTOTAL, CKMB, CKMBINDEX, TROPONINI in the last 168 hours. BNP (last 3 results) No results for input(s): PROBNP in the last 8760 hours. HbA1C: No results for input(s): HGBA1C in the last 72 hours. CBG: Recent Labs  Lab 08/28/24 2254  GLUCAP 88   Lipid Profile: No results for input(s): CHOL, HDL, LDLCALC, TRIG, CHOLHDL, LDLDIRECT in the last 72 hours. Thyroid Function Tests: No results for input(s): TSH, T4TOTAL, FREET4, T3FREE, THYROIDAB in the last 72 hours. Anemia Panel: No results for input(s): VITAMINB12, FOLATE, FERRITIN, TIBC, IRON, RETICCTPCT in the last 72 hours. Sepsis Labs: No results for input(s): PROCALCITON, LATICACIDVEN in the last 168 hours.  No results found for this or any previous visit (from the past 240 hours).       Radiology Studies: MR BRAIN/IAC W WO CONTRAST Result Date: 08/29/2024 EXAM: MR Internal Auditory Canals without and with Contrast 08/29/2024 03:30:55 PM TECHNIQUE: Multiplanar, multisequence MRI of the internal auditory canals was performed without and with administration of 8 mL of gadobutrol  (GADAVIST ) 1 MMOL/ML injection. COMPARISON: CT head 08/28/2024. CLINICAL HISTORY: Headache. FINDINGS: INTERNAL AUDITORY CANALS:  No evidence of an internal auditory canal or cerebellopontine angle cistern mass. Inner ear structures are unremarkable. MASTOIDS AND MIDDLE EARS: Trace left mastoid effusion. BRAIN AND BRAINSTEM: There is mild subcortical and periventricular T2 and FLAIR signal hyperintensity likely reflecting sequelae of chronic microvascular ischemia. Amorphous T1 hyperintensity in the bilateral basal ganglia which may relate to reported history of alcoholic cirrhosis. SINUSES: No acute abnormality. BONES AND SOFT TISSUES: Normal bone marrow signal. No acute soft tissue abnormality. IMPRESSION: 1. No internal auditory canal or cerebellopontine angle mass or abnormal enhancement.  Electronically signed by: Prentice Spade MD 08/29/2024 04:13 PM EST RP Workstation: [id number]        Scheduled Meds:  amitriptyline   10 mg Oral QHS   carvedilol   3.125 mg Oral BID WC   folic acid   1 mg Oral Daily   lactulose   30 g Oral TID   multivitamin with minerals  1 tablet Oral Daily   pantoprazole   40 mg Oral BID   rifaximin   550 mg Oral BID   sodium chloride  flush  3 mL Intravenous Q12H   thiamine   100 mg Oral Daily   Continuous Infusions:   LOS: 2 days    Time spent: 52 minutes spent on 08/30/2024 caring for this patient face-to-face including chart review, ordering labs/tests, documenting, discussion with nursing staff, consultants, updating family and interview/physical exam    Lonni SHAUNNA Dalton, DO [facility] Available via Epic secure chat 7am-7pm After these hours, please refer to coverage provider listed on [email or web address] 08/30/2024, 6:05 PM   "

## 2024-08-31 ENCOUNTER — Inpatient Hospital Stay (HOSPITAL_COMMUNITY): Payer: MEDICAID

## 2024-08-31 DIAGNOSIS — R011 Cardiac murmur, unspecified: Secondary | ICD-10-CM

## 2024-08-31 LAB — ECHOCARDIOGRAM COMPLETE
AR max vel: 1.75 cm2
AV Area VTI: 1.82 cm2
AV Area mean vel: 1.85 cm2
AV Mean grad: 15.6 mmHg
AV Peak grad: 28.6 mmHg
Ao pk vel: 2.68 m/s
Area-P 1/2: 1.9 cm2
Calc EF: 62.9 %
S' Lateral: 3.2 cm
Single Plane A2C EF: 62.3 %
Single Plane A4C EF: 62.4 %
Weight: 2871.27 [oz_av]

## 2024-08-31 LAB — COMPREHENSIVE METABOLIC PANEL WITH GFR
ALT: 28 U/L (ref 0–44)
AST: 53 U/L — ABNORMAL HIGH (ref 15–41)
Albumin: 2.5 g/dL — ABNORMAL LOW (ref 3.5–5.0)
Alkaline Phosphatase: 251 U/L — ABNORMAL HIGH (ref 38–126)
Anion gap: 6 (ref 5–15)
BUN: 11 mg/dL (ref 6–20)
CO2: 22 mmol/L (ref 22–32)
Calcium: 8.7 mg/dL — ABNORMAL LOW (ref 8.9–10.3)
Chloride: 108 mmol/L (ref 98–111)
Creatinine, Ser: 0.68 mg/dL (ref 0.61–1.24)
GFR, Estimated: >60 mL/min
Glucose, Bld: 102 mg/dL — ABNORMAL HIGH (ref 70–99)
Potassium: 4.1 mmol/L (ref 3.5–5.1)
Sodium: 136 mmol/L (ref 135–145)
Total Bilirubin: 1.8 mg/dL — ABNORMAL HIGH (ref 0.0–1.2)
Total Protein: 6.6 g/dL (ref 6.5–8.1)

## 2024-08-31 LAB — CBC
HCT: 31.2 % — ABNORMAL LOW (ref 39.0–52.0)
Hemoglobin: 10.3 g/dL — ABNORMAL LOW (ref 13.0–17.0)
MCH: 32.1 pg (ref 26.0–34.0)
MCHC: 33 g/dL (ref 30.0–36.0)
MCV: 97.2 fL (ref 80.0–100.0)
Platelets: 82 10*3/uL — ABNORMAL LOW (ref 150–400)
RBC: 3.21 MIL/uL — ABNORMAL LOW (ref 4.22–5.81)
RDW: 14.7 % (ref 11.5–15.5)
WBC: 5.4 10*3/uL (ref 4.0–10.5)
nRBC: 0 % (ref 0.0–0.2)

## 2024-08-31 LAB — PROTIME-INR
INR: 1.3 — ABNORMAL HIGH (ref 0.8–1.2)
Prothrombin Time: 17.4 s — ABNORMAL HIGH (ref 11.4–15.2)

## 2024-08-31 LAB — GLUCOSE, CAPILLARY: Glucose-Capillary: 153 mg/dL — ABNORMAL HIGH (ref 70–99)

## 2024-08-31 LAB — AMMONIA: Ammonia: 102 umol/L — ABNORMAL HIGH (ref 9–35)

## 2024-08-31 NOTE — Plan of Care (Signed)

## 2024-08-31 NOTE — Plan of Care (Signed)
  Problem: Skin Integrity: Goal: Risk for impaired skin integrity will decrease Outcome: Progressing   Problem: Tissue Perfusion: Goal: Adequacy of tissue perfusion will improve Outcome: Progressing   Problem: Elimination: Goal: Will not experience complications related to bowel motility Outcome: Progressing

## 2024-08-31 NOTE — Progress Notes (Signed)
" °  Progress Note   Patient: Ian Pham FMW:[id number] DOB: [birth year] DOA: 08/28/2024     3 DOS: the patient was seen and examined on 08/31/2024 15 p.m.      Brief hospital course: 59 y.o. M with alcoholic cirrhosis, history ascites and hepatic encephalopathy, history UGIB, secondary to gastric ulcer, diet-controlled DM, thrombocytopenia, who presented with 3 weeks of transient morning blurry vision, followed by 3 days severe occipital headache, persistent blurry vision, tremor, and confusion.     Finally came to the ER for the headache, ammonia was 105, seemed confused, admitted for hepatic encephalopathy.       Assessment and Plan: Hepatic encephalopathy Still does not appear at mental baseline. - Continue lactulose , rifaximin  - Trend ammonia  Occipital headache, blurred vision Discussed with neurology.  CT head rules out bleeding, MRI brain rules out mass, stroke, PRES. -Obtain CT angiogram and CT venogram  Cirrhosis History of varices Thrombocytopenia Platelets stable.   -Continue Coreg   History of upper GI bleed with gastric ulcer -Continue pantoprazole   Type 2 diabetes Hemoglobin A1c 5.5%, diet controlled - Okay to defer sliding scale insulin   Alcohol use disorder No evidence of withdrawal         Subjective: Patient still feels uncomfortable, still has headache     Physical Exam: BP 107/61 (BP Location: Right Arm)   Pulse 67   Temp 98.1 F (36.7 C) (Oral)   Resp 16   Wt 81.4 kg   SpO2 97%   BMI 29.90 kg/m   Adult male, lying in bed, appears weak and tired Jaundice RRR, no murmurs, no peripheral edema Respiratory normal, lungs clear without rales or wheezes Abdomen soft, no tenderness palpation or guarding Pupils are equal , extraocular movements are intact, without nystagmus.  Face symmetric, motor strength testing 5/5 in the upper and lower extremities bilaterally, finger-nose testing is within normal limits, oriented x 3,  speech slowed but fluent, mild psychomotor slowing, patient recall seems normal, attention span seems distracted      Data Reviewed: Basic metabolic panel shows normal electrolytes and renal function CBC shows anemia, thrombocytopenia, stable Discussed with neurology   Family Communication:     Disposition: Status is: Inpatient         Author: Lonni SHAUNNA Dalton, MD 08/31/2024 6:40 PM  For on call review [email or web address].    "

## 2024-08-31 NOTE — Progress Notes (Signed)
 " PROGRESS NOTE    Ian Pham  FMW:[id number] DOB: [birth year] DOA: 08/28/2024 PCP: Pcp, No (Confirm with patient/family/NH records and if not entered, this HAS to be entered at [facility] point of entry. No PCP if truly none.)   CC: Headache    Brief Narrative: (Start on day 1 of progress note - keep it brief and live) Mr. Ian Pham is a 59 y/o male with a history of liver cirrhosis, alcohol abuse, recent UGIB, DM2 diet-controlled, HTN, that presented to the ED with 3 days of persistent occipital headache that occurred with intermittent episodes of vision changes, confusion, vomiting, tremor and chills.   Upon presentation to ED, Vitals are 146/79, pulse of 99, spO2 100 on RA, and 20 RR. Was given toradol , Fioricet , and reglan  with no improvement of headache.  EtOH level less than 15, Ammonia level 108, AST 64, ALT 38, total bilirubin 2.7.  CT Head w/o contrast with no acute intracranial pathology Amitriptyline  also started upon admission with no change in headache.    Assessment & Plan:  Occipital Headache w/ blurred vision Pt p/w 3-day history of constant occipital headache w/dizziness, confusion, and blurred vision. Pt is afebrile w/no elevated white count.  CT Head normal MRI brain with and without contrast normal Discontinued amitryptyline, no effect noted Oxycodone  5mg  p.o prn 4 hours for severe pain.  Headache and blurred vision possibly secondary to hyperammonemia (ammonia level 107) CT Angiogram and CT Venogram of Head pending, due to possibility of sinus venous thrombosis    Hepatic encephalopathy (HCC)  Liver cirrhosis (HCC) + anemia of chronic disease + hyperammonemia Ammonia level on admission 108, EtOh <15 Etiology likely secondary to noncompliance with home meds of lactulose  and rifaximin . Continue lactulose , rifaximin , protonix  Strict I&Os, monitor BMs CMP in am Patient still only presents with headache and morning blurred vision, otherwise  no tremor, confusion, and persistent blurred vision since admission. Pt Reports 6-7 soft stools daily.     Essential hypertension BP stable at 107/61 Continue carvedilol  Monitor BP    DM II (diabetes mellitus, type II), diet-controlled (HCC) HgB A1C 5.5% as of (2/5), diet controlled, glucose stable in hospital Monitor off of insulin  for now   Thrombocytopenia Likely secondary to chronic alcohol use disorder Plt 99>85>81, stable Monitor CBC    Alcohol abuse Given ativan  per CIWA protocol for symptoms in ED Denies alcohol use for minimum 4 weeks now. Monitor for withdrawal symptoms    Recent UGIB  Anemia  HgB 11.2>9.5 Protonix  40 mg PO BID CBC in am   DVT prophylaxis: N/A Code Status: Full Family Communication: None at bedside    Consultants:  Neuro consulted for potential sinus venous thrombosis  Procedures: (Don't include imaging studies which can be auto populated. Include things that cannot be auto populated i.e. Echo, Carotid and venous dopplers, Foley, Bipap, HD, tubes/drains, wound vac, central lines etc) Echo performed today (2/5)    Subjective: Pt is found AxOx4, lying in bed c/o mild occipital region headache. States he slept well through the night, had 6-7 bowel movements yesterday that were primarily diarrhea, and 3 today so far. Denies any nausea, chest pain, abdominal pain, cramping, blurred vision, vomiting or shortness of breath.   Objective: Vitals:   08/30/24 1701 08/30/24 2101 08/31/24 0411 08/31/24 1325  BP: 120/68 112/62 (!) 98/56 107/61  Pulse: 71 68 69 67  Resp:  18 18 16   Temp:  98.3 F (36.8 C) 98.1 F (36.7 C) 98.1 F (36.7 C)  TempSrc:  Oral  Oral Oral  SpO2: 97% 97% 93% 97%  Weight:        Intake/Output Summary (Last 24 hours) at 08/31/2024 1649 Last data filed at 08/31/2024 1325 Gross per 24 hour  Intake 720 ml  Output --  Net 720 ml   Filed Weights   08/28/24 2245  Weight: 81.4 kg    Examination:  General exam: Appears  calm and comfortable  Respiratory system: Clear to auscultation. Respiratory effort normal. Cardiovascular system: S1 & S2 heard, RRR. No JVD, mild aortic stenosis murmur noted, rubs, gallops or clicks. No pedal edema. Gastrointestinal system: Abdomen is nondistended, soft and nontender.  Central nervous system: Alert and oriented. No focal neurological deficits. Extremities: Symmetric 5 x 5 power. Skin: No rashes, lesions or ulcers    Data Reviewed: I have personally reviewed following labs and imaging studies  CBC: Recent Labs  Lab 08/28/24 1416 08/29/24 0646 08/30/24 0633 08/31/24 0625  WBC 6.7 5.4 5.6 5.4  NEUTROABS 4.3  --   --   --   HGB 11.2* 9.1* 9.5* 10.3*  HCT 32.3* 27.3* 28.2* 31.2*  MCV 93.6 97.8 97.2 97.2  PLT 99* 85* 81* 82*    Basic Metabolic Panel: Recent Labs  Lab 08/28/24 1416 08/29/24 0646 08/30/24 0633 08/31/24 0625  NA 138 141 136 136  K 4.2 3.6 3.8 4.1  CL 109 113* 108 108  CO2 21* 21* 22 22  GLUCOSE 150* 94 125* 102*  BUN 12 15 11 11   CREATININE 0.60* 0.73 0.70 0.68  CALCIUM 8.9 8.5* 8.4* 8.7*  MG  --  2.1  --   --     GFR: CrCl cannot be calculated (Unknown ideal weight.).  Liver Function Tests: Recent Labs  Lab 08/28/24 1416 08/29/24 0646 08/30/24 0633 08/31/24 0625  AST 64* 48* 53* 53*  ALT 38 29 30 28   ALKPHOS 334* 228* 242* 251*  BILITOT 2.7* 2.8* 1.6* 1.8*  PROT 7.1 5.9* 6.2* 6.6  ALBUMIN 2.9* 2.4* 2.5* 2.5*    CBG: Recent Labs  Lab 08/28/24 2254 08/30/24 2103  GLUCAP 88 93     No results found for this or any previous visit (from the past 240 hours).       Radiology Studies: ECHOCARDIOGRAM COMPLETE Result Date: 08/31/2024    ECHOCARDIOGRAM REPORT   Patient Name:   Ian Pham Date of Exam: 08/31/2024 Medical Rec #:  [id number]                        Height:       65.0 in Accession #:    [id number]                       Weight:       179.5 lb Date of Birth:  1965/12/07                         BSA:          1.888 m Patient Age:    58 years                         BP:           98/56 mmHg Patient Gender: M  HR:           68 bpm. Exam Location:  Inpatient Procedure: 2D Echo, Cardiac Doppler and Color Doppler (Both Spectral and Color            Flow Doppler were utilized during procedure). Indications:    Murmur R01.1  History:        Patient has no prior history of Echocardiogram examinations.                 Risk Factors:Diabetes and Hypertension.  Sonographer:    Sydnee Wilson RDCS Referring Phys: [phone] CHRISTOPHER P DANFORD IMPRESSIONS  1. Left ventricular ejection fraction, by estimation, is 60 to 65%. Left ventricular ejection fraction by 2D MOD biplane is 62.9 %. The left ventricle has normal function. The left ventricle has no regional wall motion abnormalities. Left ventricular diastolic parameters were normal.  2. Right ventricular systolic function is normal. The right ventricular size is normal. There is normal pulmonary artery systolic pressure. The estimated right ventricular systolic pressure is 25.8 mmHg.  3. Left atrial size was moderately dilated.  4. The mitral valve is grossly normal. Trivial mitral valve regurgitation.  5. The aortic valve is tricuspid. Aortic valve regurgitation is not visualized. Mild aortic valve stenosis. Aortic valve area, by VTI measures 1.82 cm. Aortic valve mean gradient measures 15.6 mmHg. Aortic valve Vmax measures 2.68 m/s.  6. The inferior vena cava is normal in size with greater than 50% respiratory variability, suggesting right atrial pressure of 3 mmHg. Comparison(s): No prior Echocardiogram. FINDINGS  Left Ventricle: Left ventricular ejection fraction, by estimation, is 60 to 65%. Left ventricular ejection fraction by 2D MOD biplane is 62.9 %. The left ventricle has normal function. The left ventricle has no regional wall motion abnormalities. The left ventricular internal cavity size was normal in size. There is no left  ventricular hypertrophy. Left ventricular diastolic parameters were normal. Right Ventricle: The right ventricular size is normal. No increase in right ventricular wall thickness. Right ventricular systolic function is normal. There is normal pulmonary artery systolic pressure. The tricuspid regurgitant velocity is 2.39 m/s, and  with an assumed right atrial pressure of 3 mmHg, the estimated right ventricular systolic pressure is 25.8 mmHg. Left Atrium: Left atrial size was moderately dilated. Right Atrium: Right atrial size was normal in size. Pericardium: There is no evidence of pericardial effusion. Mitral Valve: The mitral valve is grossly normal. Trivial mitral valve regurgitation. Tricuspid Valve: The tricuspid valve is grossly normal. Tricuspid valve regurgitation is trivial. Aortic Valve: The aortic valve is tricuspid. Aortic valve regurgitation is not visualized. Mild aortic stenosis is present. Aortic valve mean gradient measures 15.6 mmHg. Aortic valve peak gradient measures 28.6 mmHg. Aortic valve area, by VTI measures 1.82 cm. Pulmonic Valve: The pulmonic valve was normal in structure. Pulmonic valve regurgitation is not visualized. Aorta: The aortic root and ascending aorta are structurally normal, with no evidence of dilitation. Venous: The inferior vena cava is normal in size with greater than 50% respiratory variability, suggesting right atrial pressure of 3 mmHg. IAS/Shunts: No atrial level shunt detected by color flow Doppler.  LEFT VENTRICLE PLAX 2D                        Biplane EF (MOD) LVIDd:         5.00 cm         LV Biplane EF:   Left LVIDs:         3.20 cm  ventricular LV PW:         1.10 cm                          ejection LV IVS:        0.70 cm                          fraction by LVOT diam:     2.00 cm                          2D MOD LV SV:         115                              biplane is LV SV Index:   61                               62.9 %. LVOT Area:      3.14 cm                                Diastology                                LV e' medial:    7.40 cm/s LV Volumes (MOD)               LV E/e' medial:  13.9 LV vol d, MOD    160.0 ml      LV e' lateral:   9.46 cm/s A2C:                           LV E/e' lateral: 10.9 LV vol d, MOD    132.0 ml A4C: LV vol s, MOD    60.4 ml A2C: LV vol s, MOD    49.6 ml A4C: LV SV MOD A2C:   99.6 ml LV SV MOD A4C:   132.0 ml LV SV MOD BP:    92.3 ml RIGHT VENTRICLE RV S prime:     19.40 cm/s TAPSE (M-mode): 2.0 cm LEFT ATRIUM             Index        RIGHT ATRIUM           Index LA diam:        3.80 cm 2.01 cm/m   RA Area:     15.40 cm LA Vol (A2C):   40.4 ml 21.40 ml/m  RA Volume:   37.30 ml  19.75 ml/m LA Vol (A4C):   75.1 ml 39.77 ml/m LA Biplane Vol: 59.7 ml 31.62 ml/m  AORTIC VALVE AV Area (Vmax):    1.75 cm AV Area (Vmean):   1.85 cm AV Area (VTI):     1.82 cm AV Vmax:           267.60 cm/s AV Vmean:          183.200 cm/s AV VTI:            0.628 m AV Peak Grad:      28.6 mmHg AV Mean Grad:      15.6 mmHg LVOT Vmax:  149.00 cm/s LVOT Vmean:        107.600 cm/s LVOT VTI:          0.365 m LVOT/AV VTI ratio: 0.58  AORTA Ao Root diam: 2.70 cm Ao Asc diam:  2.60 cm MITRAL VALVE                TRICUSPID VALVE MV Area (PHT): 1.90 cm     TR Peak grad:   22.8 mmHg MV E velocity: 103.00 cm/s  TR Vmax:        239.00 cm/s MV A velocity: 88.10 cm/s MV E/A ratio:  1.17         SHUNTS                             Systemic VTI:  0.36 m                             Systemic Diam: 2.00 cm Vinie Maxcy MD Electronically signed by Vinie Maxcy MD Signature Date/Time: 08/31/2024/2:40:18 PM    Final         Scheduled Meds:  amitriptyline   10 mg Oral QHS   carvedilol   3.125 mg Oral BID WC   folic acid   1 mg Oral Daily   lactulose   30 g Oral TID   multivitamin with minerals  1 tablet Oral Daily   pantoprazole   40 mg Oral BID   rifaximin   550 mg Oral BID   sodium chloride  flush  3 mL Intravenous Q12H   thiamine   100  mg Oral Daily   Continuous Infusions:   LOS: 3 days    Time spent: 30 minutes    Lynsi Dooner Delene Kass, PA-S [facility]   To contact the attending provider between 7A-7P or the covering provider during after hours 7P-7A, please log into the web site [email or web address] and access using universal [place] password for that web site. If you do not have the password, please call the hospital operator.  08/31/2024, 4:49 PM   "

## 2024-09-01 ENCOUNTER — Inpatient Hospital Stay (HOSPITAL_COMMUNITY): Payer: MEDICAID

## 2024-09-01 ENCOUNTER — Other Ambulatory Visit: Payer: Self-pay

## 2024-09-01 LAB — COMPREHENSIVE METABOLIC PANEL WITH GFR
ALT: 31 U/L (ref 0–44)
AST: 57 U/L — ABNORMAL HIGH (ref 15–41)
Albumin: 2.6 g/dL — ABNORMAL LOW (ref 3.5–5.0)
Alkaline Phosphatase: 255 U/L — ABNORMAL HIGH (ref 38–126)
Anion gap: 8 (ref 5–15)
BUN: 11 mg/dL (ref 6–20)
CO2: 21 mmol/L — ABNORMAL LOW (ref 22–32)
Calcium: 8.5 mg/dL — ABNORMAL LOW (ref 8.9–10.3)
Chloride: 103 mmol/L (ref 98–111)
Creatinine, Ser: 0.82 mg/dL (ref 0.61–1.24)
GFR, Estimated: >60 mL/min
Glucose, Bld: 234 mg/dL — ABNORMAL HIGH (ref 70–99)
Potassium: 4.2 mmol/L (ref 3.5–5.1)
Sodium: 131 mmol/L — ABNORMAL LOW (ref 135–145)
Total Bilirubin: 1.9 mg/dL — ABNORMAL HIGH (ref 0.0–1.2)
Total Protein: 6.9 g/dL (ref 6.5–8.1)

## 2024-09-01 LAB — CBC
HCT: 33.2 % — ABNORMAL LOW (ref 39.0–52.0)
Hemoglobin: 11 g/dL — ABNORMAL LOW (ref 13.0–17.0)
MCH: 32.4 pg (ref 26.0–34.0)
MCHC: 33.1 g/dL (ref 30.0–36.0)
MCV: 97.6 fL (ref 80.0–100.0)
Platelets: 84 10*3/uL — ABNORMAL LOW (ref 150–400)
RBC: 3.4 MIL/uL — ABNORMAL LOW (ref 4.22–5.81)
RDW: 14.6 % (ref 11.5–15.5)
WBC: 4.6 10*3/uL (ref 4.0–10.5)
nRBC: 0 % (ref 0.0–0.2)

## 2024-09-01 LAB — PROTIME-INR
INR: 1.3 — ABNORMAL HIGH (ref 0.8–1.2)
Prothrombin Time: 17.1 s — ABNORMAL HIGH (ref 11.4–15.2)

## 2024-09-01 LAB — AMMONIA: Ammonia: 66 umol/L — ABNORMAL HIGH (ref 9–35)

## 2024-09-01 LAB — GLUCOSE, CAPILLARY
Glucose-Capillary: 103 mg/dL — ABNORMAL HIGH (ref 70–99)
Glucose-Capillary: 164 mg/dL — ABNORMAL HIGH (ref 70–99)

## 2024-09-01 MED ORDER — LACTULOSE 10 GM/15ML PO SOLN
30.0000 g | Freq: Three times a day (TID) | ORAL | 10 refills | Status: AC
  Filled 2024-09-01: qty 229, 2d supply, fill #0

## 2024-09-01 MED ORDER — CARVEDILOL 3.125 MG PO TABS
3.1250 mg | ORAL_TABLET | Freq: Two times a day (BID) | ORAL | 3 refills | Status: AC
  Filled 2024-09-01: qty 180, 90d supply, fill #0

## 2024-09-01 MED ORDER — IOHEXOL 350 MG/ML SOLN
75.0000 mL | Freq: Once | INTRAVENOUS | Status: AC | PRN
  Administered 2024-09-01: 75 mL via INTRAVENOUS

## 2024-09-01 MED ORDER — PANTOPRAZOLE SODIUM 40 MG PO TBEC
40.0000 mg | DELAYED_RELEASE_TABLET | Freq: Two times a day (BID) | ORAL | 3 refills | Status: AC
  Filled 2024-09-01: qty 180, 90d supply, fill #0

## 2024-09-01 MED ORDER — RIFAXIMIN 550 MG PO TABS
550.0000 mg | ORAL_TABLET | Freq: Two times a day (BID) | ORAL | 3 refills | Status: AC
  Filled 2024-09-01: qty 180, 90d supply, fill #0

## 2024-09-01 NOTE — Discharge Summary (Signed)
 " Physician Discharge Summary   Patient: Ian Pham MRN: [id number] DOB: June 21, 1966  Admit date:     08/28/2024  Discharge date: 09/01/24  Discharge Physician: Ian Pham   PCP: Ian Pham     Recommendations at discharge:  Follow up with Ian Pham in 4 weeks Ian Pham: Please refer the patient to [facility] Please refill Rifaximin , lactulose , Coreg  and PPI        Hospital Course: 59 y.o. M with alcoholic cirrhosis, history ascites and hepatic encephalopathy, history UGIB, secondary to gastric ulcer, diet-controlled DM, thrombocytopenia, who presented with 3 weeks of transient morning blurry vision, followed by 3 days severe occipital headache, persistent blurry vision, tremor, and confusion.     Finally came to the ER for the headache, ammonia was 105, seemed confused, admitted for hepatic encephalopathy.       Hepatic encephalopathy Admitted and resumed on his lactulose  and rifaximin .  Daily teaching provided.  His ammonia improved, and his clinical symptoms resolved by the time of discharge.  He was provided with refills, and careful instructions in Spanish.    Occipital headache, blurred vision CT head ruled out bleeding, vertebral dissection, and venous sinus thrombosis.  MRI brain ruled out mass, stroke, PR ES.  Discussed with neurology, this is very likely related to hepatic encephalopathy, and indeed resolved with lactulose .      Cirrhosis History of varices Thrombocytopenia MELD 16.  Seen by Ian Pham GI during January hospitalization.  On Coreg  for varices.  Strongly encouraged alcohol cessation and GI follow up.    History of upper GI bleed with gastric ulcer Resolved.  Hgb stable.   - Continue PPI   Type 2 diabetes Hemoglobin A1c 5.5%, diet controlled   Alcohol use disorder Pham evidence of withdrawal, counseled on cessation.                    [place]  Controlled Substances  Registry was reviewed for this patient prior to discharge.   DISCHARGE MEDICATION: Allergies as of 09/01/2024   Pham Known Allergies      Medication List     TAKE these medications    carvedilol  3.125 MG tablet Commonly known as: COREG  Take 1 tablet (3.125 mg total) by mouth 2 (two) times daily with a meal.   folic acid  1 MG tablet Commonly known as: FOLVITE  Tome 1 tableta (1 mg en total) por va oral diariamente. (Take 1 tablet (1 mg total) by mouth daily.)   lactulose  10 GM/15ML solution Commonly known as: CHRONULAC  Take 45 mLs (30 g total) by mouth 3 (three) times daily.   pantoprazole  40 MG tablet Commonly known as: Protonix  Take 1 tablet (40 mg total) by mouth 2 (two) times daily.   rifaximin  550 MG Tabs tablet Commonly known as: XIFAXAN  Take 1 tablet (550 mg total) by mouth 2 (two) times daily.   thiamine  100 MG tablet Commonly known as: VITAMIN B1 Tome 1 tableta (100 mg en total) por va oral diariamente. (Take 1 tablet (100 mg total) by mouth daily.)        Follow-up Information     Ian Groom, MD Follow up.   Specialties: Gastroenterology, Internal Medicine Contact information: [address] [place]. [place] [address] [phone]                 Discharge Instructions     Discharge instructions   Complete by: As directed    **IMPORTANT DISCHARGE INSTRUCTIONS**   From Ian Pham:  You were admitted for headache, blurry vision and confusion  Here we found this was from build up of ammonia  Ammonia is supposed to be cleared out by the liver but because of your advanced liver disease, it was building up  You were treated here with lactulose  and Rifaximin .  These made you poop out the ammonia and made you better  You must take lactulose  and Rifaximin  as prescribed in order to have 3-4 poops per day  You must take pantoprazole  and carvedilol  to protect the blood vessels in your throat.  Go see Ian Pham in 4 weeks  His address  is: [facility] [address] [place] [address] [phone]  See below in the To Do section  You also will see there the contact information for the liver specialist, Ian Pham When you see Ian Pham, ask him for a referral to gastroenterology    **INSTRUCCIONES IMPORTANTES**  Del Ian Pham: Usted ingres por dolor de cabeza, visin borrosa y confusin.  Determinamos que esto se deba a una acumulacin de [name].  El hgado normalmente elimina el [name], pero debido a su enfermedad heptica avanzada, se estaba acumulando.  Aqu le tratamos con lactulosa y rifaximina. Estos medicamentos le ayudaron a biomedical scientist a travs de las heces y [place].  Debe tomar lactulosa y rifaximina segn lo recetado para tener de 3 a 4 evacuaciones intestinales al da.  Debe tomar pantoprazol y carvedilol  para proteger los vasos sanguneos de su garganta.  Consulte con Ian Pham en 4 semanas. Su direccin es: [facility] at [facility] [address] [place] [address] [phone]  Consulte la seccin [place] de cosas por hacer a continuacin.  Tambin encontrar all la informacin de contacto del especialista en hgado, el Ian Pham. Cuando vea a Ian Pham, pdale una derivacin a cytogeneticist.   Increase activity slowly   Complete by: As directed        Discharge Exam: Filed Weights   08/28/24 2245 08/31/24 2100  Weight: 81.4 kg 82.7 kg    General: Pt is alert, awake, not in acute distress Cardiovascular: RRR, nl S1-S2, Pham murmurs appreciated.   Pham LE edema.   Respiratory: Normal respiratory rate and rhythm.  CTAB without rales or wheezes. Abdominal: Abdomen soft and non-tender.  Pham distension or HSM.   Neuro/Psych: Strength symmetric in upper and lower extremities.  Judgment and insight appear normal.   Condition at discharge:  fair  The results of significant diagnostics from this hospitalization (including imaging, microbiology, ancillary and laboratory) are listed below for reference.   Imaging Studies: CT VENOGRAM HEAD Result Date: 09/01/2024 EXAM: CT VENOGRAM WITH CONTRAST 09/01/2024 08:39:38 AM TECHNIQUE: CT venogram of the head/brain was performed with the administration of 75 mL iohexol  (OMNIPAQUE ) 350 MG/ML injection. Multiplanar reformatted images are provided for review. MIP images are provided for review. Automated exposure control, iterative reconstruction, and/or weight based adjustment of the mA/kV was utilized to reduce the radiation dose to as low as reasonably achievable. COMPARISON: MRI brain 08/29/2024. CLINICAL HISTORY: Dural venous sinus thrombosis suspected. FINDINGS: BRAIN/VENTRICLES: Pham acute intracranial hemorrhage. Pham extra axial fluid collection. Gray-white differentiation is maintained. Pham mass effect or midline shift. Pham hydrocephalus. ORBITS: Pham acute abnormality. SINUSES AND MASTOIDS: Pham acute abnormality. SOFT TISSUES AND SKULL: Pham acute abnormality. CT VENOGRAM: Pham dural venous sinus thrombosis. Pham significant stenosis. IMPRESSION: 1. Pham dural venous sinus thrombosis. Electronically  signed by: Ryan Chess MD 09/01/2024 08:56 AM EST RP Workstation: [id number]   CT ANGIO HEAD W OR WO CONTRAST Result Date: 09/01/2024 EXAM: CT HEAD WITHOUT CTA HEAD WITH AND WITHOUT 09/01/2024 08:39:38 AM TECHNIQUE: CT of the head was performed without the administration of intravenous contrast followed by a CTA of the head was performed with and without the administration of intravenous contrast. 75 mL of iohexol  (OMNIPAQUE ) 350 MG/ML injection was administered. Multiplanar 2D and/or 3D reformatted images are provided for review. Automated exposure control, iterative reconstruction, and/or weight based adjustment of the mA/kV was utilized to reduce the radiation dose to as low as reasonably achievable. COMPARISON:  MRI brain 08/29/2024. CLINICAL HISTORY: Headache, sudden, severe. FINDINGS: CT HEAD: BRAIN AND VENTRICLES: There is Pham acute intracranial hemorrhage, mass effect or midline shift. Pham abnormal extra-axial fluid collection. Pham evidence of acute infarct. There is Pham hydrocephalus. There is Pham abnormal enhancement. ORBITS: The visualized portion of the orbits demonstrate Pham acute abnormality. SINUSES: The visualized paranasal sinuses and mastoid air cells demonstrate Pham acute abnormality. SOFT TISSUES AND SKULL: Pham acute abnormality of the visualized skull or soft tissues. CTA HEAD: ANTERIOR CIRCULATION: Atherosclerotic calcifications of the ICA siphons without significant stenosis or aneurysm. Pham significant stenosis of the anterior cerebral arteries. Pham significant stenosis of the middle cerebral arteries. Pham aneurysm. POSTERIOR CIRCULATION: Pham significant stenosis of the posterior cerebral arteries. Pham significant stenosis of the basilar artery. Pham significant stenosis of the vertebral arteries. Pham aneurysm. OTHER: Pham dural venous sinus thrombosis on this non-dedicated study. IMPRESSION: 1. Pham acute intracranial abnormality and Pham significant intracranial stenosis or aneurysm. Electronically signed by: Ryan Chess MD 09/01/2024 08:53 AM EST RP Workstation: [id number]   ECHOCARDIOGRAM COMPLETE Result Date: 08/31/2024    ECHOCARDIOGRAM REPORT   Patient Name:   Ian Pham Date of Exam: 08/31/2024 Medical Rec #:  [id number]                        Height:       65.0 in Accession #:    [id number]                       Weight:       179.5 lb Date of Birth:  26-Jun-1966                        BSA:          1.888 m Patient Age:    58 years                         BP:           98/56 mmHg Patient Gender: M                                HR:           68 bpm. Exam Location:  Inpatient Procedure: 2D Echo, Cardiac Doppler and Color Doppler (Both Spectral and Color            Flow Doppler were utilized  during procedure). Indications:    Murmur R01.1  History:        Patient has Pham prior history of Echocardiogram examinations.                 Risk Factors:Diabetes and Hypertension.  Sonographer:  Gwendolynn Blush RDCS Referring Phys: [id number] Ishani Goldwasser P Mohammad Granade IMPRESSIONS  1. Left ventricular ejection fraction, by estimation, is 60 to 65%. Left ventricular ejection fraction by 2D MOD biplane is 62.9 %. The left ventricle has normal function. The left ventricle has Pham regional wall motion abnormalities. Left ventricular diastolic parameters were normal.  2. Right ventricular systolic function is normal. The right ventricular size is normal. There is normal pulmonary artery systolic pressure. The estimated right ventricular systolic pressure is 25.8 mmHg.  3. Left atrial size was moderately dilated.  4. The mitral valve is grossly normal. Trivial mitral valve regurgitation.  5. The aortic valve is tricuspid. Aortic valve regurgitation is not visualized. Mild aortic valve stenosis. Aortic valve area, by VTI measures 1.82 cm. Aortic valve mean gradient measures 15.6 mmHg. Aortic valve Vmax measures 2.68 m/s.  6. The inferior vena cava is normal in size with greater than 50% respiratory variability, suggesting right atrial pressure of 3 mmHg. Comparison(s): Pham prior Echocardiogram. FINDINGS  Left Ventricle: Left ventricular ejection fraction, by estimation, is 60 to 65%. Left ventricular ejection fraction by 2D MOD biplane is 62.9 %. The left ventricle has normal function. The left ventricle has Pham regional wall motion abnormalities. The left ventricular internal cavity size was normal in size. There is Pham left ventricular hypertrophy. Left ventricular diastolic parameters were normal. Right Ventricle: The right ventricular size is normal. Pham increase in right ventricular wall thickness. Right ventricular systolic function is normal. There is normal pulmonary artery systolic pressure. The tricuspid regurgitant  velocity is 2.39 m/s, and  with an assumed right atrial pressure of 3 mmHg, the estimated right ventricular systolic pressure is 25.8 mmHg. Left Atrium: Left atrial size was moderately dilated. Right Atrium: Right atrial size was normal in size. Pericardium: There is Pham evidence of pericardial effusion. Mitral Valve: The mitral valve is grossly normal. Trivial mitral valve regurgitation. Tricuspid Valve: The tricuspid valve is grossly normal. Tricuspid valve regurgitation is trivial. Aortic Valve: The aortic valve is tricuspid. Aortic valve regurgitation is not visualized. Mild aortic stenosis is present. Aortic valve mean gradient measures 15.6 mmHg. Aortic valve peak gradient measures 28.6 mmHg. Aortic valve area, by VTI measures 1.82 cm. Pulmonic Valve: The pulmonic valve was normal in structure. Pulmonic valve regurgitation is not visualized. Aorta: The aortic root and ascending aorta are structurally normal, with Pham evidence of dilitation. Venous: The inferior vena cava is normal in size with greater than 50% respiratory variability, suggesting right atrial pressure of 3 mmHg. IAS/Shunts: Pham atrial level shunt detected by color flow Doppler.  LEFT VENTRICLE PLAX 2D                        Biplane EF (MOD) LVIDd:         5.00 cm         LV Biplane EF:   Left LVIDs:         3.20 cm                          ventricular LV PW:         1.10 cm                          ejection LV IVS:        0.70 cm  fraction by LVOT diam:     2.00 cm                          2D MOD LV SV:         115                              biplane is LV SV Index:   61                               62.9 %. LVOT Area:     3.14 cm                                Diastology                                LV e' medial:    7.40 cm/s LV Volumes (MOD)               LV E/e' medial:  13.9 LV vol d, MOD    160.0 ml      LV e' lateral:   9.46 cm/s A2C:                           LV E/e' lateral: 10.9 LV vol d, MOD    132.0 ml A4C:  LV vol s, MOD    60.4 ml A2C: LV vol s, MOD    49.6 ml A4C: LV SV MOD A2C:   99.6 ml LV SV MOD A4C:   132.0 ml LV SV MOD BP:    92.3 ml RIGHT VENTRICLE RV S prime:     19.40 cm/s TAPSE (M-mode): 2.0 cm LEFT ATRIUM             Index        RIGHT ATRIUM           Index LA diam:        3.80 cm 2.01 cm/m   RA Area:     15.40 cm LA Vol (A2C):   40.4 ml 21.40 ml/m  RA Volume:   37.30 ml  19.75 ml/m LA Vol (A4C):   75.1 ml 39.77 ml/m LA Biplane Vol: 59.7 ml 31.62 ml/m  AORTIC VALVE AV Area (Vmax):    1.75 cm AV Area (Vmean):   1.85 cm AV Area (VTI):     1.82 cm AV Vmax:           267.60 cm/s AV Vmean:          183.200 cm/s AV VTI:            0.628 m AV Peak Grad:      28.6 mmHg AV Mean Grad:      15.6 mmHg LVOT Vmax:         149.00 cm/s LVOT Vmean:        107.600 cm/s LVOT VTI:          0.365 m LVOT/AV VTI ratio: 0.58  AORTA Ao Root diam: 2.70 cm Ao Asc diam:  2.60 cm MITRAL VALVE                TRICUSPID VALVE MV Area (PHT): 1.90 cm  TR Peak grad:   22.8 mmHg MV E velocity: 103.00 cm/s  TR Vmax:        239.00 cm/s MV A velocity: 88.10 cm/s MV E/A ratio:  1.17         SHUNTS                             Systemic VTI:  0.36 m                             Systemic Diam: 2.00 cm Vinie Maxcy MD Electronically signed by Vinie Maxcy MD Signature Date/Time: 08/31/2024/2:40:18 PM    Final    MR BRAIN/IAC W WO CONTRAST Result Date: 08/29/2024 EXAM: MR Internal Auditory Canals without and with Contrast 08/29/2024 03:30:55 PM TECHNIQUE: Multiplanar, multisequence MRI of the internal auditory canals was performed without and with administration of 8 mL of gadobutrol  (GADAVIST ) 1 MMOL/ML injection. COMPARISON: CT head 08/28/2024. CLINICAL HISTORY: Headache. FINDINGS: INTERNAL AUDITORY CANALS: Pham evidence of an internal auditory canal or cerebellopontine angle cistern mass. Inner ear structures are unremarkable. MASTOIDS AND MIDDLE EARS: Trace left mastoid effusion. BRAIN AND BRAINSTEM: There is mild subcortical and  periventricular T2 and FLAIR signal hyperintensity likely reflecting sequelae of chronic microvascular ischemia. Amorphous T1 hyperintensity in the bilateral basal ganglia which may relate to reported history of alcoholic cirrhosis. SINUSES: Pham acute abnormality. BONES AND SOFT TISSUES: Normal bone marrow signal. Pham acute soft tissue abnormality. IMPRESSION: 1. Pham internal auditory canal or cerebellopontine angle mass or abnormal enhancement. Electronically signed by: Prentice Spade MD 08/29/2024 04:13 PM EST RP Workstation: [id number]   CT Head Wo Contrast Result Date: 08/28/2024 CLINICAL DATA:  Headache for 3 days. EXAM: CT HEAD WITHOUT CONTRAST TECHNIQUE: Contiguous axial images were obtained from the base of the skull through the vertex without intravenous contrast. RADIATION DOSE REDUCTION: This exam was performed according to the departmental dose-optimization program which includes automated exposure control, adjustment of the mA and/or kV according to patient size and/or use of iterative reconstruction technique. COMPARISON:  July 30, 2006 FINDINGS: Brain: Pham evidence of acute infarction, hemorrhage, hydrocephalus, extra-axial collection or mass lesion/mass effect. Vascular: Pham hyperdense vessel or unexpected calcification. Skull: Normal. Negative for fracture or focal lesion. Sinuses/Orbits: Pham acute finding. Other: None. IMPRESSION: Pham acute intracranial pathology. Electronically Signed   By: Suzen Dials M.D.   On: 08/28/2024 13:39   CT ABDOMEN PELVIS W CONTRAST Result Date: 08/05/2024 CLINICAL DATA:  Abdominal pain.  Vomiting. EXAM: CT ABDOMEN AND PELVIS WITH CONTRAST TECHNIQUE: Multidetector CT imaging of the abdomen and pelvis was performed using the standard protocol following bolus administration of intravenous contrast. RADIATION DOSE REDUCTION: This exam was performed according to the departmental dose-optimization program which includes automated exposure control, adjustment of the mA  and/or kV according to patient size and/or use of iterative reconstruction technique. CONTRAST:  75mL OMNIPAQUE  IOHEXOL  350 MG/ML SOLN COMPARISON:  09/24/2017 FINDINGS: Lower chest: Dependent atelectasis noted in the lung bases. Hepatobiliary: Markedly nodular liver contour is compatible with cirrhosis, new in the interval. Gallbladder is markedly distended with some ill definition of the gallbladder wall but Pham evidence for gallstones. Pham intrahepatic or extrahepatic biliary dilation. Pancreas: Pham focal mass lesion. Pham dilatation of the main duct. Pham intraparenchymal cyst. Pham peripancreatic edema. Spleen: Pham splenomegaly. Pham suspicious focal mass lesion. Adrenals/Urinary Tract: Pham adrenal nodule or mass. Kidneys unremarkable. Pham evidence for hydroureter. The urinary  bladder appears normal for the degree of distention. Stomach/Bowel: Stomach is unremarkable. Pham gastric wall thickening. Pham evidence of outlet obstruction. Duodenum is normally positioned as is the ligament of Treitz. Pham small bowel wall thickening. Pham small bowel dilatation. The terminal ileum is normal. The appendix is normal. Pham gross colonic mass. Pham colonic wall thickening. Diverticular changes are noted in the left colon without evidence of diverticulitis. 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 Extensive collateralization noted in the left upper quadrant. Paraumbilical vein is been recanalized. Pham abdominal aortic aneurysm. Pham abdominal aortic atherosclerotic calcification. There is Pham gastrohepatic or hepatoduodenal ligament lymphadenopathy. Pham retroperitoneal or mesenteric lymphadenopathy. Pham pelvic sidewall lymphadenopathy. Reproductive: The prostate gland and seminal vesicles are unremarkable. Other: Pham substantial intraperitoneal free fluid. Musculoskeletal: Pham worrisome lytic or sclerotic osseous abnormality. IMPRESSION: 1. Markedly nodular liver contour is compatible with cirrhosis, new in the  interval. Associated vascular sequelae of portal venous hypertension. 2. Markedly distended gallbladder with some ill definition of the gallbladder wall but Pham evidence for gallstones. If there is clinical concern for acute cholecystitis, right upper quadrant ultrasound could be used to further evaluate. 3. Left colonic diverticulosis without diverticulitis. Electronically Signed   By: Camellia Candle M.D.   On: 08/05/2024 05:52    Microbiology: Results for orders placed or performed during the hospital encounter of 10/13/23  Group A Strep by PCR     Status: None   Collection Time: 10/13/23 12:08 PM   Specimen: Throat; Sterile Swab  Result Value Ref Range Status   Group A Strep by PCR NOT DETECTED NOT DETECTED Final    Comment: Performed at [facility], [address] [place], [place] [address]  Resp panel by RT-PCR (RSV, Flu A&B, Covid) Throat     Status: Abnormal   Collection Time: 10/13/23 12:08 PM   Specimen: Throat; Nasal Swab  Result Value Ref Range Status   SARS Coronavirus 2 by RT PCR NEGATIVE NEGATIVE Final   Influenza A by PCR POSITIVE (A) NEGATIVE Final   Influenza B by PCR NEGATIVE NEGATIVE Final    Comment: (NOTE) The Xpert Xpress SARS-CoV-2/FLU/RSV plus assay is intended as an aid in the diagnosis of influenza from Nasopharyngeal swab specimens and should not be used as a sole basis for treatment. Nasal washings and aspirates are unacceptable for Xpert Xpress SARS-CoV-2/FLU/RSV testing.  Fact Sheet for Patients: bloggercourse.com  Fact Sheet for Healthcare Providers: seriousbroker.it  This test is not yet approved or cleared by the [place]  FDA and has been authorized for detection and/or diagnosis of SARS-CoV-2 by FDA under an Emergency Use Authorization (EUA). This EUA will remain in effect (meaning this test can be used) for the duration of the COVID-19 declaration under Section 564(b)(1) of the Act, 21  U.S.C. section 360bbb-3(b)(1), unless the authorization is terminated or revoked.     Resp Syncytial Virus by PCR NEGATIVE NEGATIVE Final    Comment: (NOTE) Fact Sheet for Patients: bloggercourse.com  Fact Sheet for Healthcare Providers: seriousbroker.it  This test is not yet approved or cleared by the [place]  FDA and has been authorized for detection and/or diagnosis of SARS-CoV-2 by FDA under an Emergency Use Authorization (EUA). This EUA will remain in effect (meaning this test can be used) for the duration of the COVID-19 declaration under Section 564(b)(1) of the Act, 21 U.S.C. section 360bbb-3(b)(1), unless the authorization is terminated or revoked.  Performed at [facility], [address], [place], [place] [address]  Labs: CBC: Recent Labs  Lab 08/28/24 1416 08/29/24 0646 08/30/24 0633 08/31/24 0625 09/01/24 0959  WBC 6.7 5.4 5.6 5.4 4.6  NEUTROABS 4.3  --   --   --   --   HGB 11.2* 9.1* 9.5* 10.3* 11.0*  HCT 32.3* 27.3* 28.2* 31.2* 33.2*  MCV 93.6 97.8 97.2 97.2 97.6  PLT 99* 85* 81* 82* 84*   Basic Metabolic Panel: Recent Labs  Lab 08/28/24 1416 08/29/24 0646 08/30/24 0633 08/31/24 0625 09/01/24 0959  NA 138 141 136 136 131*  K 4.2 3.6 3.8 4.1 4.2  CL 109 113* 108 108 103  CO2 21* 21* 22 22 21*  GLUCOSE 150* 94 125* 102* 234*  BUN 12 15 11 11 11   CREATININE 0.60* 0.73 0.70 0.68 0.82  CALCIUM 8.9 8.5* 8.4* 8.7* 8.5*  MG  --  2.1  --   --   --    Liver Function Tests: Recent Labs  Lab 08/28/24 1416 08/29/24 0646 08/30/24 0633 08/31/24 0625 09/01/24 0959  AST 64* 48* 53* 53* 57*  ALT 38 29 30 28 31   ALKPHOS 334* 228* 242* 251* 255*  BILITOT 2.7* 2.8* 1.6* 1.8* 1.9*  PROT 7.1 5.9* 6.2* 6.6 6.9  ALBUMIN 2.9* 2.4* 2.5* 2.5* 2.6*   CBG: Recent Labs  Lab 08/28/24 2254 08/30/24 2103 08/31/24 2112 09/01/24 0737 09/01/24 1108  GLUCAP 88 93 153* 103* 164*    Discharge  time spent: approximately 45 minutes spent on discharge counseling, evaluation of patient on day of discharge, and coordination of discharge planning with nursing, social work, pharmacy and case management  Signed: Lonni SHAUNNA Dalton, MD [facility] 09/01/2024         "

## 2024-09-01 NOTE — Progress Notes (Signed)
 " PROGRESS NOTE    Ian Pham  FMW:[id number] DOB: 04-30-1966 DOA: 08/28/2024 PCP: Pcp, No    CC: Headache      Brief Narrative:  Ian Pham is a 60 y/o male with a history of liver cirrhosis, alcohol abuse, recent UGIB, DM2 diet-controlled, HTN, that presented to the ED with 3 days of persistent occipital headache that occurred with intermittent episodes of vision changes, confusion, vomiting, tremor and chills.    Upon presentation to ED, Vitals are 146/79, pulse of 99, spO2 100 on RA, and 20 RR. Was given toradol , Fioricet , and reglan  with no improvement of headache.  EtOH level less than 15, Ammonia level 108, AST 64, ALT 38, total bilirubin 2.7.   CT Head w/o contrast with no acute intracranial pathology Amitriptyline  also started upon admission with no change in headache. CT venogram and Angiogram (2/6)- negative for sinus thrombosis.     Assessment & Plan:  Occipital Headache w/ blurred vision Pt p/w 3-day history of constant occipital headache w/dizziness, confusion, and blurred vision. Pt is afebrile w/no elevated white count.  CT Head normal MRI brain with and without contrast normal Oxycodone  5mg  p.o prn 4 hours for severe pain.  Headache and blurred vision possibly secondary to hyperammonemia (ammonia level 107) CT Angiogram and CT Venogram of Head - no evidence of sinus thrombosis  Ammonia continues to decrease, now at 60 States he feels like headache is almost gone, only feels a tender soreness to scalp, versus a headache. After discharge expected to continue lactulose  and rifaximin , as well as follow up with primary and get referral for Liver specialist.       Hepatic encephalopathy (HCC)  Liver cirrhosis (HCC) + anemia of chronic disease + hyperammonemia Ammonia level on admission 108, EtOh <15 Etiology likely secondary to noncompliance with home meds of lactulose  and rifaximin . Continue lactulose , rifaximin , protonix  Strict I&Os,  monitor BMs CMP in am Patient still only presents with mild headache and morning blurred vision, otherwise no tremor, confusion, and persistent blurred vision since admission. Pt Reports 6-7 soft stools daily.       Essential hypertension BP stable at 117/59 Continue carvedilol  Monitor BP     DM II (diabetes mellitus, type II), diet-controlled (HCC) HgB A1C 5.5% as of (2/5), diet controlled, glucose stable in hospital Monitor off of insulin  for now    Thrombocytopenia Likely secondary to chronic alcohol use disorder Plt 99>85>81>84, stable Monitor CBC     Alcohol abuse Given ativan  per CIWA protocol for symptoms in ED Denies alcohol use for minimum 4 weeks now. Monitor for withdrawal symptoms     Recent UGIB             Anemia  HgB 11.2>9.5>11, improving. Protonix  40 mg PO BID CBC in am   DVT prophylaxis: N/A Code Status: Full Family Communication: None at bedside    Consultants:  Neuro consulted  Procedures: (Don't include imaging studies which can be auto populated. Include things that cannot be auto populated i.e. Echo, Carotid and venous dopplers, Foley, Bipap, HD, tubes/drains, wound vac, central lines etc) Echo (2/5) Ct venogram and angiogram of head (2/6)    Subjective: Pt is found AxOx4, seated at side of bed eating breakfast. States he feels much better today and that he thinks his headache is almost gone. Reports 5-6 soft stools yesterday, and 1 this morning thus far. Denies dizziness, fatigue, abdominal pain, nausea, vomiting, chest pain, sob, blurred vision.  Objective: Vitals:   08/31/24 1325 08/31/24 1939  08/31/24 2100 09/01/24 0521  BP: 107/61 (!) 100/55  (!) 100/55  Pulse: 67 72  71  Resp: 16 18  18   Temp: 98.1 F (36.7 C) 98.6 F (37 C)  98.2 F (36.8 C)  TempSrc: Oral Oral  Oral  SpO2: 97% 94%  96%  Weight:   82.7 kg   Height:   5' 5 (1.651 m)     Intake/Output Summary (Last 24 hours) at 09/01/2024 0818 Last data filed at 08/31/2024  1325 Gross per 24 hour  Intake 480 ml  Output --  Net 480 ml   Filed Weights   08/28/24 2245 08/31/24 2100  Weight: 81.4 kg 82.7 kg    Examination:  General exam: Appears calm and comfortable Respiratory system: Clear to auscultation. Respiratory effort normal. Cardiovascular system: S1 & S2 heard, RRR. No JVD, murmurs, rubs, gallops or clicks. No pedal edema. Gastrointestinal system: Abdomen is nondistended, soft and nontender.  Central nervous system: Alert and oriented. No focal neurological deficits. Extremities: Symmetric 5 x 5 power. Skin: No rashes, lesions or ulcers Psychiatry: Judgement and insight appear normal. Mood & affect appropriate.     Data Reviewed: I have personally reviewed following labs and imaging studies  CBC: Recent Labs  Lab 08/28/24 1416 08/29/24 0646 08/30/24 0633 08/31/24 0625  WBC 6.7 5.4 5.6 5.4  NEUTROABS 4.3  --   --   --   HGB 11.2* 9.1* 9.5* 10.3*  HCT 32.3* 27.3* 28.2* 31.2*  MCV 93.6 97.8 97.2 97.2  PLT 99* 85* 81* 82*    Basic Metabolic Panel: Recent Labs  Lab 08/28/24 1416 08/29/24 0646 08/30/24 0633 08/31/24 0625  NA 138 141 136 136  K 4.2 3.6 3.8 4.1  CL 109 113* 108 108  CO2 21* 21* 22 22  GLUCOSE 150* 94 125* 102*  BUN 12 15 11 11   CREATININE 0.60* 0.73 0.70 0.68  CALCIUM 8.9 8.5* 8.4* 8.7*  MG  --  2.1  --   --     GFR: Estimated Creatinine Clearance: 98.4 mL/min (by C-G formula based on SCr of 0.68 mg/dL).  Liver Function Tests: Recent Labs  Lab 08/28/24 1416 08/29/24 0646 08/30/24 0633 08/31/24 0625  AST 64* 48* 53* 53*  ALT 38 29 30 28   ALKPHOS 334* 228* 242* 251*  BILITOT 2.7* 2.8* 1.6* 1.8*  PROT 7.1 5.9* 6.2* 6.6  ALBUMIN 2.9* 2.4* 2.5* 2.5*    CBG: Recent Labs  Lab 08/28/24 2254 08/30/24 2103 08/31/24 2112 09/01/24 0737  GLUCAP 88 93 153* 103*     No results found for this or any previous visit (from the past 240 hours).       Radiology Studies: ECHOCARDIOGRAM  COMPLETE Result Date: 08/31/2024    ECHOCARDIOGRAM REPORT   Patient Name:   Ian Pham Date of Exam: 08/31/2024 Medical Rec #:  [id number]                        Height:       65.0 in Accession #:    [id number]                       Weight:       179.5 lb Date of Birth:  [birth year]                        BSA:          1.888 m Patient Age:  58 years                         BP:           98/56 mmHg Patient Gender: M                                HR:           68 bpm. Exam Location:  Inpatient Procedure: 2D Echo, Cardiac Doppler and Color Doppler (Both Spectral and Color            Flow Doppler were utilized during procedure). Indications:    Murmur R01.1  History:        Patient has no prior history of Echocardiogram examinations.                 Risk Factors:Diabetes and Hypertension.  Sonographer:    Sydnee Wilson RDCS Referring Phys: [phone] CHRISTOPHER P DANFORD IMPRESSIONS  1. Left ventricular ejection fraction, by estimation, is 60 to 65%. Left ventricular ejection fraction by 2D MOD biplane is 62.9 %. The left ventricle has normal function. The left ventricle has no regional wall motion abnormalities. Left ventricular diastolic parameters were normal.  2. Right ventricular systolic function is normal. The right ventricular size is normal. There is normal pulmonary artery systolic pressure. The estimated right ventricular systolic pressure is 25.8 mmHg.  3. Left atrial size was moderately dilated.  4. The mitral valve is grossly normal. Trivial mitral valve regurgitation.  5. The aortic valve is tricuspid. Aortic valve regurgitation is not visualized. Mild aortic valve stenosis. Aortic valve area, by VTI measures 1.82 cm. Aortic valve mean gradient measures 15.6 mmHg. Aortic valve Vmax measures 2.68 m/s.  6. The inferior vena cava is normal in size with greater than 50% respiratory variability, suggesting right atrial pressure of 3 mmHg. Comparison(s): No prior Echocardiogram. FINDINGS   Left Ventricle: Left ventricular ejection fraction, by estimation, is 60 to 65%. Left ventricular ejection fraction by 2D MOD biplane is 62.9 %. The left ventricle has normal function. The left ventricle has no regional wall motion abnormalities. The left ventricular internal cavity size was normal in size. There is no left ventricular hypertrophy. Left ventricular diastolic parameters were normal. Right Ventricle: The right ventricular size is normal. No increase in right ventricular wall thickness. Right ventricular systolic function is normal. There is normal pulmonary artery systolic pressure. The tricuspid regurgitant velocity is 2.39 m/s, and  with an assumed right atrial pressure of 3 mmHg, the estimated right ventricular systolic pressure is 25.8 mmHg. Left Atrium: Left atrial size was moderately dilated. Right Atrium: Right atrial size was normal in size. Pericardium: There is no evidence of pericardial effusion. Mitral Valve: The mitral valve is grossly normal. Trivial mitral valve regurgitation. Tricuspid Valve: The tricuspid valve is grossly normal. Tricuspid valve regurgitation is trivial. Aortic Valve: The aortic valve is tricuspid. Aortic valve regurgitation is not visualized. Mild aortic stenosis is present. Aortic valve mean gradient measures 15.6 mmHg. Aortic valve peak gradient measures 28.6 mmHg. Aortic valve area, by VTI measures 1.82 cm. Pulmonic Valve: The pulmonic valve was normal in structure. Pulmonic valve regurgitation is not visualized. Aorta: The aortic root and ascending aorta are structurally normal, with no evidence of dilitation. Venous: The inferior vena cava is normal in size with greater than 50% respiratory variability, suggesting right atrial pressure of 3 mmHg. IAS/Shunts: No atrial level  shunt detected by color flow Doppler.  LEFT VENTRICLE PLAX 2D                        Biplane EF (MOD) LVIDd:         5.00 cm         LV Biplane EF:   Left LVIDs:         3.20 cm                           ventricular LV PW:         1.10 cm                          ejection LV IVS:        0.70 cm                          fraction by LVOT diam:     2.00 cm                          2D MOD LV SV:         115                              biplane is LV SV Index:   61                               62.9 %. LVOT Area:     3.14 cm                                Diastology                                LV e' medial:    7.40 cm/s LV Volumes (MOD)               LV E/e' medial:  13.9 LV vol d, MOD    160.0 ml      LV e' lateral:   9.46 cm/s A2C:                           LV E/e' lateral: 10.9 LV vol d, MOD    132.0 ml A4C: LV vol s, MOD    60.4 ml A2C: LV vol s, MOD    49.6 ml A4C: LV SV MOD A2C:   99.6 ml LV SV MOD A4C:   132.0 ml LV SV MOD BP:    92.3 ml RIGHT VENTRICLE RV S prime:     19.40 cm/s TAPSE (M-mode): 2.0 cm LEFT ATRIUM             Index        RIGHT ATRIUM           Index LA diam:        3.80 cm 2.01 cm/m   RA Area:     15.40 cm LA Vol (A2C):   40.4 ml 21.40 ml/m  RA Volume:   37.30 ml  19.75 ml/m LA Vol (A4C):   75.1 ml 39.77 ml/m  LA Biplane Vol: 59.7 ml 31.62 ml/m  AORTIC VALVE AV Area (Vmax):    1.75 cm AV Area (Vmean):   1.85 cm AV Area (VTI):     1.82 cm AV Vmax:           267.60 cm/s AV Vmean:          183.200 cm/s AV VTI:            0.628 m AV Peak Grad:      28.6 mmHg AV Mean Grad:      15.6 mmHg LVOT Vmax:         149.00 cm/s LVOT Vmean:        107.600 cm/s LVOT VTI:          0.365 m LVOT/AV VTI ratio: 0.58  AORTA Ao Root diam: 2.70 cm Ao Asc diam:  2.60 cm MITRAL VALVE                TRICUSPID VALVE MV Area (PHT): 1.90 cm     TR Peak grad:   22.8 mmHg MV E velocity: 103.00 cm/s  TR Vmax:        239.00 cm/s MV A velocity: 88.10 cm/s MV E/A ratio:  1.17         SHUNTS                             Systemic VTI:  0.36 m                             Systemic Diam: 2.00 cm Vinie Maxcy MD Electronically signed by Vinie Maxcy MD Signature Date/Time: 08/31/2024/2:40:18 PM    Final          Scheduled Meds:  carvedilol   3.125 mg Oral BID WC   folic acid   1 mg Oral Daily   lactulose   30 g Oral TID   multivitamin with minerals  1 tablet Oral Daily   pantoprazole   40 mg Oral BID   rifaximin   550 mg Oral BID   sodium chloride  flush  3 mL Intravenous Q12H   thiamine   100 mg Oral Daily   Continuous Infusions:   LOS: 4 days    Time spent: 30 minutes    Soul Hackman [employer], PA-S [facility]   To contact the attending provider between 7A-7P or the covering provider during after hours 7P-7A, please log into the web site [email or web address] and access using universal [place] password for that web site. If you do not have the password, please call the hospital operator.  09/01/2024, 8:18 AM   "

## 2024-09-01 NOTE — Plan of Care (Signed)

## 2024-09-01 NOTE — TOC Progression Note (Signed)
 Transition of Care [facility]) - Progression Note    Patient Details  Name: Ian Pham MRN: [id number] Date of Birth: 04-14-1966  [facility] CM/SW Contact  Toy LITTIE Agar, RN Phone Number:[phone]  09/01/2024, 12:59 PM  Clinical Narrative:    CM received message from MD for patient discharging. MD requesting MATCH for medications, financial assistance for benefits for health care in order to see follow up provider and transportation assistance for discharge home. Patient received MATCH on 08/11/2024. Patient has previously also been connected with financial navigator for medicaid eligibility currently medicaid potential. For transportation patient states that his family is at work and he will call a friend or travel by the bus. Patient currently confirms that he has seen financial navigator and that he has recently received his meds through [facility]. CM has used interpretation services [employer] # [phone]) to have this discussion with patient. No other needs are noted at this time.                      Expected Discharge Plan and Services         Expected Discharge Date: 09/01/24                                     Social Drivers of Health (SDOH) Interventions SDOH Screenings   Food Insecurity: No Food Insecurity (08/28/2024)  Housing: Unknown (08/28/2024)  Transportation Needs: No Transportation Needs (08/28/2024)  Utilities: Not At Risk (08/28/2024)  Social Connections: Moderately Isolated (08/28/2024)  Tobacco Use: Medium Risk (08/28/2024)    Readmission Risk Interventions    08/11/2024   11:11 AM  Readmission Risk Prevention Plan  Medication Screening Complete  Transportation Screening Complete

## 2024-09-26 ENCOUNTER — Ambulatory Visit: Payer: Self-pay
